# Patient Record
Sex: Female | Born: 1960 | Race: White | Hispanic: No | Marital: Married | Smoking: Never smoker
Health system: Southern US, Community
[De-identification: ages and names within clinical notes are randomized; demographics above are authoritative.]

## PROBLEM LIST (undated history)

## (undated) DIAGNOSIS — F988 Other specified behavioral and emotional disorders with onset usually occurring in childhood and adolescence: Secondary | ICD-10-CM

## (undated) DIAGNOSIS — T7840XA Allergy, unspecified, initial encounter: Secondary | ICD-10-CM

## (undated) DIAGNOSIS — K219 Gastro-esophageal reflux disease without esophagitis: Secondary | ICD-10-CM

## (undated) DIAGNOSIS — D649 Anemia, unspecified: Secondary | ICD-10-CM

## (undated) DIAGNOSIS — I10 Essential (primary) hypertension: Secondary | ICD-10-CM

## (undated) HISTORY — DX: Essential (primary) hypertension: I10

## (undated) HISTORY — DX: Allergy, unspecified, initial encounter: T78.40XA

## (undated) HISTORY — DX: Anemia, unspecified: D64.9

## (undated) HISTORY — DX: Gastro-esophageal reflux disease without esophagitis: K21.9

## (undated) HISTORY — DX: Other specified behavioral and emotional disorders with onset usually occurring in childhood and adolescence: F98.8

## (undated) HISTORY — PX: ESOPHAGOGASTRODUODENOSCOPY ENDOSCOPY: SHX5814

## (undated) HISTORY — PX: JOINT REPLACEMENT: SHX530

---
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
## 1999-02-09 ENCOUNTER — Inpatient Hospital Stay (HOSPITAL_COMMUNITY): Admission: AD | Admit: 1999-02-09 | Discharge: 1999-02-11 | Payer: Self-pay | Admitting: *Deleted

## 1999-03-29 ENCOUNTER — Other Ambulatory Visit: Admission: RE | Admit: 1999-03-29 | Discharge: 1999-03-29 | Payer: Self-pay | Admitting: *Deleted

## 2000-07-31 ENCOUNTER — Other Ambulatory Visit: Admission: RE | Admit: 2000-07-31 | Discharge: 2000-07-31 | Payer: Self-pay | Admitting: *Deleted

## 2001-10-24 ENCOUNTER — Other Ambulatory Visit: Admission: RE | Admit: 2001-10-24 | Discharge: 2001-10-24 | Payer: Self-pay | Admitting: *Deleted

## 2003-02-17 ENCOUNTER — Other Ambulatory Visit: Admission: RE | Admit: 2003-02-17 | Discharge: 2003-02-17 | Payer: Self-pay | Admitting: *Deleted

## 2004-08-30 ENCOUNTER — Other Ambulatory Visit: Admission: RE | Admit: 2004-08-30 | Discharge: 2004-08-30 | Payer: Self-pay | Admitting: Obstetrics and Gynecology

## 2004-10-21 ENCOUNTER — Ambulatory Visit: Payer: Self-pay | Admitting: Cardiology

## 2004-11-15 ENCOUNTER — Ambulatory Visit: Payer: Self-pay | Admitting: Cardiology

## 2004-12-28 ENCOUNTER — Ambulatory Visit: Payer: Self-pay | Admitting: Cardiology

## 2004-12-28 ENCOUNTER — Other Ambulatory Visit: Admission: RE | Admit: 2004-12-28 | Discharge: 2004-12-28 | Payer: Self-pay | Admitting: Obstetrics and Gynecology

## 2005-12-04 HISTORY — PX: FOOT SURGERY: SHX648

## 2008-03-20 ENCOUNTER — Emergency Department: Payer: Self-pay | Admitting: Emergency Medicine

## 2009-04-29 ENCOUNTER — Ambulatory Visit: Payer: Self-pay | Admitting: Urology

## 2010-08-02 ENCOUNTER — Ambulatory Visit: Payer: Self-pay | Admitting: Internal Medicine

## 2011-07-24 ENCOUNTER — Other Ambulatory Visit: Payer: Self-pay | Admitting: *Deleted

## 2011-07-25 ENCOUNTER — Other Ambulatory Visit: Payer: Self-pay | Admitting: *Deleted

## 2011-07-25 MED ORDER — ESOMEPRAZOLE MAGNESIUM 40 MG PO CPDR: 40.0000 mg | DELAYED_RELEASE_CAPSULE | Freq: Two times a day (BID) | ORAL | Status: DC

## 2011-08-15 ENCOUNTER — Other Ambulatory Visit: Payer: Self-pay | Admitting: *Deleted

## 2011-08-15 MED ORDER — NORETHIN ACE-ETH ESTRAD-FE 1-20 MG-MCG PO TABS: 1.0000 | ORAL_TABLET | Freq: Every day | ORAL | Status: DC

## 2011-08-15 NOTE — Telephone Encounter (Signed)
Received faxed refill request from pharmacy. Is it okay to refill Junel?

## 2011-09-06 ENCOUNTER — Ambulatory Visit (INDEPENDENT_AMBULATORY_CARE_PROVIDER_SITE_OTHER): Payer: BC Managed Care – PPO | Admitting: Internal Medicine

## 2011-09-06 ENCOUNTER — Encounter: Payer: Self-pay | Admitting: Internal Medicine

## 2011-09-06 DIAGNOSIS — Z23 Encounter for immunization: Secondary | ICD-10-CM

## 2011-09-06 DIAGNOSIS — Z309 Encounter for contraceptive management, unspecified: Secondary | ICD-10-CM

## 2011-09-06 DIAGNOSIS — Z6825 Body mass index (BMI) 25.0-25.9, adult: Secondary | ICD-10-CM

## 2011-09-06 DIAGNOSIS — I10 Essential (primary) hypertension: Secondary | ICD-10-CM

## 2011-09-06 DIAGNOSIS — Z1239 Encounter for other screening for malignant neoplasm of breast: Secondary | ICD-10-CM

## 2011-09-06 DIAGNOSIS — Z0000 Encounter for general adult medical examination without abnormal findings: Secondary | ICD-10-CM

## 2011-09-06 DIAGNOSIS — E663 Overweight: Secondary | ICD-10-CM

## 2011-09-06 NOTE — Patient Instructions (Signed)
Your blood pressure needs to be less than 130/80 before we can safely start phentermine for appetite control.  Check it one week after starting your lisinopril.  Try to get 25 minutes of ierobic exercise 5 times weekly.  Jacob's Ladder is an excellent non knee or back stressing machine.

## 2011-09-07 ENCOUNTER — Other Ambulatory Visit (HOSPITAL_COMMUNITY)
Admission: RE | Admit: 2011-09-07 | Discharge: 2011-09-07 | Disposition: A | Discharge status: Home / Self Care | Payer: BC Managed Care – PPO | Source: Ambulatory Visit | Attending: Internal Medicine | Admitting: Internal Medicine

## 2011-09-07 ENCOUNTER — Encounter: Payer: Self-pay | Admitting: Internal Medicine

## 2011-09-07 DIAGNOSIS — Z01419 Encounter for gynecological examination (general) (routine) without abnormal findings: Secondary | ICD-10-CM | POA: Insufficient documentation

## 2011-09-07 DIAGNOSIS — E663 Overweight: Secondary | ICD-10-CM | POA: Insufficient documentation

## 2011-09-07 DIAGNOSIS — R030 Elevated blood-pressure reading, without diagnosis of hypertension: Secondary | ICD-10-CM | POA: Insufficient documentation

## 2011-09-07 DIAGNOSIS — Z309 Encounter for contraceptive management, unspecified: Secondary | ICD-10-CM | POA: Insufficient documentation

## 2011-09-07 DIAGNOSIS — N760 Acute vaginitis: Secondary | ICD-10-CM | POA: Insufficient documentation

## 2011-09-07 DIAGNOSIS — Z1159 Encounter for screening for other viral diseases: Secondary | ICD-10-CM | POA: Insufficient documentation

## 2011-09-07 DIAGNOSIS — I10 Essential (primary) hypertension: Secondary | ICD-10-CM | POA: Insufficient documentation

## 2011-09-07 HISTORY — DX: Overweight: E66.3

## 2011-09-07 NOTE — Assessment & Plan Note (Signed)
Secondary to medication noncompliance.  Resume prior medications and will adjust doser for gaol bp of 130/80.

## 2011-09-07 NOTE — Assessment & Plan Note (Signed)
She is requesting an appetite suppressant but her blood pressure is uncontrolled and she is not exercising regularly.  counselling given on diet and exercise.  Will not rx phenetermine until bp is controlled and patient is making a concerted effort to alter lifestyle.

## 2011-09-07 NOTE — Assessment & Plan Note (Signed)
UPT was negative  Pelvic exam and PAP done.  Will refill OCPs

## 2011-09-07 NOTE — Progress Notes (Signed)
  Subjective:    Patient ID: Destiny Martin, female    DOB: Aug 03, 1961, 50 y.o.   MRN: [id number]  HPI    Review of Systems     Objective:   Physical Exam        Assessment & Plan:   Subjective:     Destiny Martin is a 50 y.o. female here for a routine exam.  Current complaints: none.  Personal health questionnaire reviewed: yes.   Gynecologic History Patient's last menstrual period was 08/21/2011. Contraception: OCP (estrogen/progesterone) Last Pap: 2010. Results were: normal Last mammogram 2011. Results were: normal  Obstetric History OB History    Grav Para Term Preterm Abortions TAB SAB Ect Mult Living                   The following portions of the patient's history were reviewed and updated as appropriate: allergies, current medications, past family history, past medical history, past social history, past surgical history and problem list.  Review of Systems A comprehensive review of systems was negative.    Objective:    BP 162/103  Pulse 81  Temp(Src) 98.1 F (36.7 C) (Oral)  Resp 18  Ht 5\' 2"  (1.575 m)  Wt 162 lb 8 oz (73.71 kg)  BMI 29.72 kg/m2  SpO2 99%  LMP 08/21/2011  General Appearance:    Alert, cooperative, no distress, appears stated age  Head:    Normocephalic, without obvious abnormality, atraumatic  Eyes:    PERRL, conjunctiva/corneas clear, EOM's intact, fundi    benign, both eyes  Ears:    Normal TM's and external ear canals, both ears  Nose:   Nares normal, septum midline, mucosa normal, no drainage    or sinus tenderness  Throat:   Lips, mucosa, and tongue normal; teeth and gums normal  Neck:   Supple, symmetrical, trachea midline, no adenopathy;    thyroid:  no enlargement/tenderness/nodules; no carotid   bruit or JVD  Back:     Symmetric, no curvature, ROM normal, no CVA tenderness  Lungs:     Clear to auscultation bilaterally, respirations unlabored  Chest Wall:    No tenderness or deformity   Heart:    Regular rate and  rhythm, S1 and S2 normal, no murmur, rub   or gallop  Breast Exam:    No tenderness, masses, or nipple abnormality  Abdomen:     Soft, non-tender, bowel sounds active all four quadrants,    no masses, no organomegaly  Genitalia:    Normal female without lesion, discharge or tenderness  Rectal:    Normal tone, normal prostate, no masses or tenderness;   guaiac negative stool  Extremities:   Extremities normal, atraumatic, no cyanosis or edema  Pulses:   2+ and symmetric all extremities  Skin:   Skin color, texture, turgor normal, no rashes or lesions  Lymph nodes:   Cervical, supraclavicular, and axillary nodes normal  Neurologic:   CNII-XII intact, normal strength, sensation and reflexes    throughout      Assessment:    Healthy female exam.  Hypertension;  Uncontrolled currently . Secondary to medication adherence/lapse.  Resume lisinopril and increase to 20 mg daily if not at goal of 130/80 in one week.    Plan:    Education reviewed: calcium supplements, low fat, low cholesterol diet and self breast exams. Contraception: OCP (estrogen/progesterone). Mammogram ordered. Follow up in: 6 months.

## 2011-09-13 ENCOUNTER — Telehealth: Payer: Self-pay | Admitting: Internal Medicine

## 2011-09-13 DIAGNOSIS — E663 Overweight: Secondary | ICD-10-CM

## 2011-09-13 MED ORDER — PHENTERMINE HCL 37.5 MG PO TABS: ORAL_TABLET | ORAL | Status: DC

## 2011-09-13 NOTE — Telephone Encounter (Signed)
Pt called to let you know bp  last reading 123/78 pt is talking bp meds now Please call in the phemtermine  [facility] [address]

## 2011-09-13 NOTE — Telephone Encounter (Signed)
Rx has been called in  

## 2011-09-13 NOTE — Telephone Encounter (Signed)
Message copied by Edd Fabian on Wed Sep 13, 2011  5:24 PM ------      Message from: Duncan Dull      Created: Wed Sep 13, 2011  1:19 PM      Regarding: Phentermine       Can you phone in  The phentermine rx for ms Bebo?  I have updated chart,  thanks

## 2011-09-15 ENCOUNTER — Encounter: Payer: Self-pay | Admitting: *Deleted

## 2011-10-02 ENCOUNTER — Other Ambulatory Visit: Payer: Self-pay | Admitting: Internal Medicine

## 2011-10-02 MED ORDER — CITALOPRAM HYDROBROMIDE 20 MG PO TABS: 20.0000 mg | ORAL_TABLET | Freq: Every day | ORAL | Status: DC

## 2012-01-23 ENCOUNTER — Ambulatory Visit: Payer: Self-pay | Admitting: Internal Medicine

## 2012-01-24 ENCOUNTER — Ambulatory Visit: Payer: BC Managed Care – PPO | Admitting: Internal Medicine

## 2012-01-24 ENCOUNTER — Encounter: Payer: Self-pay | Admitting: Internal Medicine

## 2012-01-24 ENCOUNTER — Ambulatory Visit (INDEPENDENT_AMBULATORY_CARE_PROVIDER_SITE_OTHER): Payer: BC Managed Care – PPO | Admitting: Internal Medicine

## 2012-01-24 VITALS — BP 122/86 | Temp 98.4°F | Wt 157.0 lb

## 2012-01-24 DIAGNOSIS — J0100 Acute maxillary sinusitis, unspecified: Secondary | ICD-10-CM

## 2012-01-24 DIAGNOSIS — J329 Chronic sinusitis, unspecified: Secondary | ICD-10-CM

## 2012-01-24 MED ORDER — HYDROCODONE-ACETAMINOPHEN 5-500 MG PO TABS: 1.0000 | ORAL_TABLET | Freq: Four times a day (QID) | ORAL | Status: AC | PRN

## 2012-01-24 MED ORDER — LEVOFLOXACIN 500 MG PO TABS: 500.0000 mg | ORAL_TABLET | Freq: Every day | ORAL | Status: AC

## 2012-01-24 NOTE — Progress Notes (Signed)
Subjective:    Patient ID: Destiny Martin, female    DOB: May 04, 1961, 51 y.o.   MRN: [id number]  HPI   Destiny Martin is a 51 y r old [employer] with multiple sick contacts who presents with 2 weeks of persistent sinus congestion that has not responded to oral decongestnats and antihistamines.She began having pain in her maxillary sinuses and upper teeth 24 hours ago that is persistent and does not respond to ibuprofen..  She is also reporting persistent headache and productive cough but denies fevers and myalgias.   Past Medical History  Diagnosis Date  . Hypertension    Current Outpatient Prescriptions on File Prior to Visit  Medication Sig Dispense Refill  . citalopram (CELEXA) 20 MG tablet Take 1 tablet (20 mg total) by mouth daily.  30 tablet  4  . esomeprazole (NEXIUM) 40 MG capsule Take 1 capsule (40 mg total) by mouth 2 (two) times daily.  60 capsule  6  . lisinopril (PRINIVIL,ZESTRIL) 10 MG tablet Take 10 mg by mouth daily.        . norethindrone-ethinyl estradiol (JUNEL FE,GILDESS FE,LOESTRIN FE) 1-20 MG-MCG tablet Take 1 tablet by mouth daily.  1 Package  12  . phentermine (ADIPEX-P) 37.5 MG tablet Take 1/2 tablet twice daily 30 minutes before breakfast and dinner  30 tablet  2   Review of Systems  Constitutional: Negative for fever, chills and unexpected weight change.  HENT: Positive for congestion, rhinorrhea, dental problem, postnasal drip and sinus pressure. Negative for hearing loss, ear pain, nosebleeds, sore throat, facial swelling, sneezing, mouth sores, trouble swallowing, neck pain, neck stiffness, voice change, tinnitus and ear discharge.   Eyes: Negative for pain, discharge, redness and visual disturbance.  Respiratory: Positive for cough. Negative for chest tightness, shortness of breath, wheezing and stridor.   Cardiovascular: Negative for chest pain, palpitations and leg swelling.  Musculoskeletal: Negative for myalgias and arthralgias.  Skin: Negative for color  change and rash.  Neurological: Positive for headaches. Negative for dizziness, weakness and light-headedness.  Hematological: Negative for adenopathy.       Objective:   Physical Exam  Constitutional: She is oriented to person, place, and time. She appears well-developed and well-nourished.  HENT:  Right Ear: Tympanic membrane is injected and bulging.  Left Ear: Tympanic membrane is injected.  Nose: Right sinus exhibits maxillary sinus tenderness. Left sinus exhibits maxillary sinus tenderness.  Mouth/Throat: Oropharynx is clear and moist.  Eyes: EOM are normal. Pupils are equal, round, and reactive to light. No scleral icterus.  Neck: Normal range of motion. Neck supple. No JVD present. No thyromegaly present.  Cardiovascular: Normal rate, regular rhythm, normal heart sounds and intact distal pulses.   Pulmonary/Chest: Effort normal and breath sounds normal.  Abdominal: Soft. Bowel sounds are normal. She exhibits no mass. There is no tenderness.  Musculoskeletal: Normal range of motion. She exhibits no edema.  Lymphadenopathy:    She has no cervical adenopathy.  Neurological: She is alert and oriented to person, place, and time.  Skin: Skin is warm and dry.  Psychiatric: She has a normal mood and affect.      Assessment & Plan:   Sinusitis, acute maxillary Given chronicity of symptoms, development of facial pain and exam consistent with bacterial URI,  Will treat with empiric antibiotics, decongestants, and saline lavage.       Updated Medication List Outpatient Encounter Prescriptions as of 01/24/2012  Medication Sig Dispense Refill  . citalopram (CELEXA) 20 MG tablet Take 1 tablet (  20 mg total) by mouth daily.  30 tablet  4  . esomeprazole (NEXIUM) 40 MG capsule Take 1 capsule (40 mg total) by mouth 2 (two) times daily.  60 capsule  6  . lisinopril (PRINIVIL,ZESTRIL) 10 MG tablet Take 10 mg by mouth daily.        . norethindrone-ethinyl estradiol (JUNEL FE,GILDESS  FE,LOESTRIN FE) 1-20 MG-MCG tablet Take 1 tablet by mouth daily.  1 Package  12  . phentermine (ADIPEX-P) 37.5 MG tablet Take 1/2 tablet twice daily 30 minutes before breakfast and dinner  30 tablet  2  . HYDROcodone-acetaminophen (VICODIN) 5-500 MG per tablet Take 1 tablet by mouth every 6 (six) hours as needed for pain (or cough).  30 tablet  0  . levofloxacin (LEVAQUIN) 500 MG tablet Take 1 tablet (500 mg total) by mouth daily.  10 tablet  0

## 2012-01-24 NOTE — Patient Instructions (Signed)
I am prescribing Levaquin 500 mg one tablet daily for 10 days for your sinus infection  Please take sudafed PE up to 30 mg every 6 hours to keep the nasal passages from swelling  Use the vicodin for control of cough.  Add simply saline twice a day to flush your sinuses

## 2012-01-25 ENCOUNTER — Other Ambulatory Visit: Payer: Self-pay | Admitting: *Deleted

## 2012-01-25 MED ORDER — URELLE 81 MG PO TABS: 1.0000 | ORAL_TABLET | Freq: Two times a day (BID) | ORAL | Status: DC

## 2012-01-26 ENCOUNTER — Ambulatory Visit: Payer: BC Managed Care – PPO | Admitting: Internal Medicine

## 2012-01-26 ENCOUNTER — Telehealth: Payer: Self-pay | Admitting: *Deleted

## 2012-01-26 MED ORDER — HYDROCOD POLST-CHLORPHEN POLST 10-8 MG/5ML PO LQCR: 5.0000 mL | Freq: Two times a day (BID) | ORAL | Status: DC

## 2012-01-26 NOTE — Telephone Encounter (Signed)
Patient says that the hydrocodone is not helping with the cough. She is asking if she could get tussionex called in.

## 2012-01-26 NOTE — Telephone Encounter (Signed)
Yes you can call in tussionex   5  1 teaspoon every 12 hours prn cough  Qty 200 ml  1 refills

## 2012-01-26 NOTE — Telephone Encounter (Signed)
Addended by: Jobie Quaker on: 01/26/2012 05:08 PM   Modules accepted: Orders

## 2012-01-26 NOTE — Telephone Encounter (Signed)
Rx called to pharmacy

## 2012-01-28 ENCOUNTER — Encounter: Payer: Self-pay | Admitting: Internal Medicine

## 2012-01-28 DIAGNOSIS — J0100 Acute maxillary sinusitis, unspecified: Secondary | ICD-10-CM | POA: Insufficient documentation

## 2012-01-28 NOTE — Assessment & Plan Note (Signed)
Given chronicity of symptoms, development of facial pain and exam consistent with bacterial URI,  Will treat with empiric antibiotics, decongestants, and saline lavage.   

## 2012-02-12 ENCOUNTER — Encounter: Payer: Self-pay | Admitting: Internal Medicine

## 2012-02-19 ENCOUNTER — Telehealth: Payer: Self-pay | Admitting: Internal Medicine

## 2012-02-19 NOTE — Telephone Encounter (Signed)
[facility] Triage Call Report Triage Record Num: [id number] Operator: Di Kindle Patient Name: Destiny Martin Call Date & Time: 02/19/2012 1:02:31PM Patient Phone: [phone] PCP: Duncan Dull Patient Gender: Female PCP Fax : [phone] Patient DOB: [birth year] Practice Name: [facility] Reason for Call: Caller: Durene/Patient is calling with a question about Prednisone.The medication was written by Duncan Dull. Follows OV for congestion ~ 2 weeks ago, completed ABX, states Tussionex for continued cough makes her too sleepl, [facility] [address], states steroid was discussed. Also want to make sure you got her message that you had asked for: phone number. OFFICE PLEASE Protocol(s) Used: PCP Calls, No Triage (Adult) Recommended Outcome per Protocol: Call Provider within 24 Hours Reason for Outcome: [1] Caller requests to speak ONLY to PCP AND [2] nonurgent question Care Advice: ~ 02/19/2012 1:07:55PM Page 1 of 1 [id number]

## 2012-03-05 ENCOUNTER — Other Ambulatory Visit: Payer: Self-pay | Admitting: Internal Medicine

## 2012-03-05 MED ORDER — CITALOPRAM HYDROBROMIDE 20 MG PO TABS: 20.0000 mg | ORAL_TABLET | Freq: Every day | ORAL | Status: DC

## 2012-03-07 ENCOUNTER — Other Ambulatory Visit: Payer: Self-pay | Admitting: Internal Medicine

## 2012-03-07 MED ORDER — ESOMEPRAZOLE MAGNESIUM 40 MG PO CPDR: 40.0000 mg | DELAYED_RELEASE_CAPSULE | Freq: Two times a day (BID) | ORAL | Status: DC

## 2012-03-12 ENCOUNTER — Other Ambulatory Visit: Payer: Self-pay | Admitting: Internal Medicine

## 2012-03-12 MED ORDER — LISINOPRIL 10 MG PO TABS: 10.0000 mg | ORAL_TABLET | Freq: Every day | ORAL | Status: DC

## 2012-04-08 ENCOUNTER — Other Ambulatory Visit: Payer: Self-pay | Admitting: Internal Medicine

## 2012-04-08 MED ORDER — FLUTICASONE PROPIONATE 50 MCG/ACT NA SUSP: 2.0000 | Freq: Every day | NASAL | Status: DC

## 2012-06-24 ENCOUNTER — Telehealth: Payer: Self-pay | Admitting: Internal Medicine

## 2012-06-24 MED ORDER — URELLE 81 MG PO TABS: 1.0000 | ORAL_TABLET | Freq: Two times a day (BID) | ORAL | Status: DC

## 2012-06-24 MED ORDER — CIPROFLOXACIN HCL 250 MG PO TABS: 250.0000 mg | ORAL_TABLET | Freq: Two times a day (BID) | ORAL | Status: DC

## 2012-06-24 NOTE — Telephone Encounter (Signed)
yes, sent to [facility]

## 2012-06-24 NOTE — Telephone Encounter (Signed)
Patient would like the prescription for her urale the medication that makes your urine turn blue.  [facility] [address] She states that this medication is kept on file for her to call when she needs it.  She is going out of town please let her know when this has been called in.

## 2012-06-24 NOTE — Telephone Encounter (Signed)
Patient notified

## 2012-06-24 NOTE — Telephone Encounter (Signed)
Patient called back and stated you normally give her an Rx for Cipro that she keeps on file for when she goes on vacation.  She wanted to know if this was still possible.  Please advise.

## 2012-06-24 NOTE — Telephone Encounter (Signed)
Sent via epic 

## 2012-07-08 ENCOUNTER — Other Ambulatory Visit: Payer: Self-pay | Admitting: *Deleted

## 2012-07-08 MED ORDER — ESOMEPRAZOLE MAGNESIUM 40 MG PO CPDR: 40.0000 mg | DELAYED_RELEASE_CAPSULE | Freq: Two times a day (BID) | ORAL | Status: DC

## 2012-07-13 ENCOUNTER — Other Ambulatory Visit: Payer: Self-pay | Admitting: Internal Medicine

## 2012-07-18 ENCOUNTER — Other Ambulatory Visit: Payer: Self-pay | Admitting: *Deleted

## 2012-07-18 MED ORDER — ESOMEPRAZOLE MAGNESIUM 40 MG PO CPDR: 40.0000 mg | DELAYED_RELEASE_CAPSULE | Freq: Two times a day (BID) | ORAL | Status: DC

## 2012-07-31 ENCOUNTER — Ambulatory Visit: Payer: Self-pay | Admitting: Unknown Physician Specialty

## 2012-08-01 ENCOUNTER — Telehealth: Payer: Self-pay | Admitting: Internal Medicine

## 2012-08-01 DIAGNOSIS — K219 Gastro-esophageal reflux disease without esophagitis: Secondary | ICD-10-CM

## 2012-08-01 MED ORDER — PANTOPRAZOLE SODIUM 40 MG PO TBEC: 40.0000 mg | DELAYED_RELEASE_TABLET | Freq: Every day | ORAL | Status: DC

## 2012-08-01 NOTE — Telephone Encounter (Signed)
Patient notified, she picked up Rx and is taking it to the pharmacy.  She will let us know how it works for her.

## 2012-08-01 NOTE — Telephone Encounter (Signed)
I have no idea which one her insurance will cover,  We wiill try protonix.,  Order on printer

## 2012-08-01 NOTE — Telephone Encounter (Signed)
We had to do a prior authorization on patients Nexium and the insurance denied coverage.   Please advise on another medication I will put the notification in your red folder.

## 2012-08-06 ENCOUNTER — Other Ambulatory Visit: Payer: Self-pay | Admitting: *Deleted

## 2012-08-06 DIAGNOSIS — K219 Gastro-esophageal reflux disease without esophagitis: Secondary | ICD-10-CM

## 2012-08-06 MED ORDER — PANTOPRAZOLE SODIUM 40 MG PO TBEC: 40.0000 mg | DELAYED_RELEASE_TABLET | Freq: Every day | ORAL | Status: DC

## 2012-08-26 ENCOUNTER — Telehealth: Payer: Self-pay | Admitting: Internal Medicine

## 2012-08-26 MED ORDER — NORETHIN ACE-ETH ESTRAD-FE 1-20 MG-MCG PO TABS: 1.0000 | ORAL_TABLET | Freq: Every day | ORAL | Status: DC

## 2012-08-26 NOTE — Telephone Encounter (Signed)
Yes, refill authorized. She's up-to-date on Pap smears

## 2012-08-26 NOTE — Telephone Encounter (Signed)
junel fe 1 mg-20 mcg tablet 28.0 ea Twenty eight Prescribed refills 12 Take 1 tablet by mouth daily

## 2012-08-26 NOTE — Telephone Encounter (Signed)
Ok to refill? Last OV 10/12. No upcoming OV scheduled.

## 2012-08-26 NOTE — Telephone Encounter (Signed)
Refilled by Dr. Darrick Huntsman

## 2012-12-17 ENCOUNTER — Other Ambulatory Visit: Payer: Self-pay | Admitting: Internal Medicine

## 2012-12-17 MED ORDER — LISINOPRIL 10 MG PO TABS: 10.0000 mg | ORAL_TABLET | Freq: Every day | ORAL | Status: DC

## 2012-12-17 NOTE — Telephone Encounter (Signed)
Med filled. Pt due for OV when this RX expires.

## 2012-12-17 NOTE — Telephone Encounter (Signed)
lisinopril (PRINIVIL,ZESTRIL) 10 MG tablet °#90 ° ° °

## 2013-01-16 ENCOUNTER — Other Ambulatory Visit: Payer: Self-pay | Admitting: Internal Medicine

## 2013-03-22 ENCOUNTER — Other Ambulatory Visit: Payer: Self-pay | Admitting: Internal Medicine

## 2013-03-24 NOTE — Telephone Encounter (Signed)
Rx sent to pharmacy by escript for one month. Left message for patient to call and schedule OV, overdue for followup.

## 2013-03-27 ENCOUNTER — Other Ambulatory Visit: Payer: Self-pay | Admitting: Internal Medicine

## 2013-04-16 LAB — HM PAP SMEAR: HM PAP: NORMAL

## 2013-05-02 ENCOUNTER — Ambulatory Visit (INDEPENDENT_AMBULATORY_CARE_PROVIDER_SITE_OTHER): Payer: BC Managed Care – PPO | Admitting: Internal Medicine

## 2013-05-02 ENCOUNTER — Encounter: Payer: Self-pay | Admitting: Internal Medicine

## 2013-05-02 ENCOUNTER — Other Ambulatory Visit (HOSPITAL_COMMUNITY)
Admission: RE | Admit: 2013-05-02 | Discharge: 2013-05-02 | Disposition: A | Discharge status: Home / Self Care | Payer: BC Managed Care – PPO | Source: Ambulatory Visit | Attending: Internal Medicine | Admitting: Internal Medicine

## 2013-05-02 VITALS — BP 108/76 | HR 82 | Temp 98.7°F | Resp 16 | Ht 60.0 in | Wt 157.5 lb

## 2013-05-02 DIAGNOSIS — N760 Acute vaginitis: Secondary | ICD-10-CM

## 2013-05-02 DIAGNOSIS — Z1151 Encounter for screening for human papillomavirus (HPV): Secondary | ICD-10-CM | POA: Insufficient documentation

## 2013-05-02 DIAGNOSIS — E663 Overweight: Secondary | ICD-10-CM

## 2013-05-02 DIAGNOSIS — Z0000 Encounter for general adult medical examination without abnormal findings: Secondary | ICD-10-CM

## 2013-05-02 DIAGNOSIS — I10 Essential (primary) hypertension: Secondary | ICD-10-CM

## 2013-05-02 DIAGNOSIS — Z01419 Encounter for gynecological examination (general) (routine) without abnormal findings: Secondary | ICD-10-CM | POA: Insufficient documentation

## 2013-05-02 DIAGNOSIS — Z309 Encounter for contraceptive management, unspecified: Secondary | ICD-10-CM

## 2013-05-02 DIAGNOSIS — Z124 Encounter for screening for malignant neoplasm of cervix: Secondary | ICD-10-CM

## 2013-05-02 DIAGNOSIS — R5383 Other fatigue: Secondary | ICD-10-CM

## 2013-05-02 DIAGNOSIS — Z6825 Body mass index (BMI) 25.0-25.9, adult: Secondary | ICD-10-CM

## 2013-05-02 MED ORDER — PHENTERMINE HCL 37.5 MG PO TABS: ORAL_TABLET | ORAL | Status: DC

## 2013-05-02 NOTE — Assessment & Plan Note (Signed)
Annual comprehensive exam was done including breast, pelvic and PAP smear. All screenings have been addressed .  

## 2013-05-02 NOTE — Assessment & Plan Note (Signed)
Well controlled on current regimen. Renal function due, no changes today. Reminded to have BP rechecked while taking  phentermine to make sure  It is not causing adverse effects on pulse and BP

## 2013-05-02 NOTE — Assessment & Plan Note (Addendum)
She is requesting to resume phentermine short term.  She is exercising and following a low glycemic index diet along with husband Gery Pray.  She will return in one month for weight in

## 2013-05-02 NOTE — Progress Notes (Signed)
Patient ID: Destiny Martin, female   DOB: 05/15/1961, 52 y.o.   MRN: [id number]  Subjective:     Destiny Martin is a 52 y.o. female and is here for a comprehensive physical exam. The patient reports problems - with inability to lose weight, and decreased concentration.  History   Social History  . Marital Status: Married    Spouse Name: N/A    Number of Children: N/A  . Years of Education: N/A   Occupational History  . Not on file.   Social History Main Topics  . Smoking status: Never Smoker   . Smokeless tobacco: Never Used  . Alcohol Use: Yes     Comment: moderate  . Drug Use: No  . Sexually Active: Not on file   Other Topics Concern  . Not on file   Social History Narrative  . No narrative on file   Health Maintenance  Topic Date Due  . Mammogram  12/18/2012  . Colonoscopy  12/18/2012  . Influenza Vaccine  08/04/2013  . Pap Smear  09/06/2014  . Tetanus/tdap  09/05/2021    The following portions of the patient's history were reviewed and updated as appropriate: allergies, current medications, past family history, past medical history, past social history, past surgical history and problem list.  Review of Systems A comprehensive review of systems was negative.   Objective:   BP 108/76  Pulse 82  Temp(Src) 98.7 F (37.1 C) (Oral)  Resp 16  Ht 5' (1.524 m)  Wt 157 lb 8 oz (71.442 kg)  BMI 30.76 kg/m2  SpO2 98%  LMP 03/12/2013  General Appearance:    Alert, cooperative, no distress, appears stated age  Head:    Normocephalic, without obvious abnormality, atraumatic  Eyes:    PERRL, conjunctiva/corneas clear, EOM's intact, fundi    benign, both eyes  Ears:    Normal TM's and external ear canals, both ears  Nose:   Nares normal, septum midline, mucosa normal, no drainage    or sinus tenderness  Throat:   Lips, mucosa, and tongue normal; teeth and gums normal  Neck:   Supple, symmetrical, trachea midline, no adenopathy;    thyroid:  no  enlargement/tenderness/nodules; no carotid   bruit or JVD  Back:     Symmetric, no curvature, ROM normal, no CVA tenderness  Lungs:     Clear to auscultation bilaterally, respirations unlabored  Chest Wall:    No tenderness or deformity   Heart:    Regular rate and rhythm, S1 and S2 normal, no murmur, rub   or gallop  Breast Exam:    No tenderness, masses, or nipple abnormality  Abdomen:     Soft, non-tender, bowel sounds active all four quadrants,    no masses, no organomegaly  Genitalia:    Pelvic: cervix normal in appearance, external genitalia normal, no adnexal masses or tenderness, no cervical motion tenderness, rectovaginal septum normal, uterus normal size, shape, and consistency and vagina normal without discharge  Extremities:   Extremities normal, atraumatic, no cyanosis or edema  Pulses:   2+ and symmetric all extremities  Skin:   Skin color, texture, turgor normal, no rashes or lesions  Lymph nodes:   Cervical, supraclavicular, and axillary nodes normal  Neurologic:   CNII-XII intact, normal strength, sensation and reflexes    throughout    Assessment:   Routine general medical examination at a health care facility Annual comprehensive exam was done including breast, pelvic and PAP smear. All screenings have been  addressed .   Overweight (BMI 25.0-29.9) She is requesting to resume phentermine short term.  She is exercising and following a low glycemic index diet along with husband Gery Pray.  She will return in one month for weight in   Hypertension Well controlled on current regimen. Renal function due, no changes today. Reminded to have BP rechecked while taking  phentermine to make sure  It is not causing adverse effects on pulse and BP  Contraception management Still menstruating infrequently,.  Continue birth control.    Updated Medication List Outpatient Encounter Prescriptions as of 05/02/2013  Medication Sig Dispense Refill  . citalopram (CELEXA) 20 MG tablet TAKE  1 TABLET (20 MG TOTAL) BY MOUTH DAILY.  30 tablet  4  . esomeprazole (NEXIUM) 40 MG capsule Take 1 capsule (40 mg total) by mouth 2 (two) times daily.  60 capsule  6  . fluticasone (FLONASE) 50 MCG/ACT nasal spray PLACE 2 SPRAYS INTO THE NOSE DAILY.  16 g  6  . lisinopril (PRINIVIL,ZESTRIL) 10 MG tablet TAKE 1 TABLET BY MOUTH DAILY.  30 tablet  0  . norethindrone-ethinyl estradiol (JUNEL FE,GILDESS FE,LOESTRIN FE) 1-20 MG-MCG tablet Take 1 tablet by mouth daily.  1 Package  12  . phentermine (ADIPEX-P) 37.5 MG tablet Take 1/2 tablet twice daily 30 minutes before breakfast and dinner  30 tablet  2  . URELLE (URELLE/URISED) 81 MG TABS Take 1 tablet (81 mg total) by mouth 2 (two) times daily.  10 each  3  . [DISCONTINUED] phentermine (ADIPEX-P) 37.5 MG tablet Take 1/2 tablet twice daily 30 minutes before breakfast and dinner  30 tablet  2  . chlorpheniramine-HYDROcodone (TUSSIONEX) 10-8 MG/5ML LQCR Take 5 mLs by mouth every 12 (twelve) hours.  200 mL  0  . pantoprazole (PROTONIX) 40 MG tablet Take 1 tablet (40 mg total) by mouth daily.  30 tablet  3   No facility-administered encounter medications on file as of 05/02/2013.

## 2013-05-02 NOTE — Assessment & Plan Note (Signed)
Still menstruating infrequently,.  Continue birth control.

## 2013-05-02 NOTE — Patient Instructions (Addendum)
Please return for fasting labs as soon as you can:  We'll screen for diabetes, anemia, cholesterol, etc  dont forget about low carb pasta ([employer],  Found everywhere but [employer])  [employer] has an excellent ready made pesto, you can combine with their precooked chicken sausage and tomatoes with the low carb pasta for a fast meal.  Reat watermelon in moderation!!!  Tru substiuting sugar free fruity pop drinks in your cocktails   We will refill your meds

## 2013-05-05 ENCOUNTER — Other Ambulatory Visit: Payer: Self-pay | Admitting: Internal Medicine

## 2013-05-06 ENCOUNTER — Other Ambulatory Visit (INDEPENDENT_AMBULATORY_CARE_PROVIDER_SITE_OTHER): Payer: BC Managed Care – PPO

## 2013-05-06 ENCOUNTER — Telehealth: Payer: Self-pay | Admitting: Internal Medicine

## 2013-05-06 DIAGNOSIS — K219 Gastro-esophageal reflux disease without esophagitis: Secondary | ICD-10-CM

## 2013-05-06 DIAGNOSIS — D510 Vitamin B12 deficiency anemia due to intrinsic factor deficiency: Secondary | ICD-10-CM

## 2013-05-06 DIAGNOSIS — I10 Essential (primary) hypertension: Secondary | ICD-10-CM

## 2013-05-06 DIAGNOSIS — R5383 Other fatigue: Secondary | ICD-10-CM

## 2013-05-06 DIAGNOSIS — D509 Iron deficiency anemia, unspecified: Secondary | ICD-10-CM

## 2013-05-06 DIAGNOSIS — Z0000 Encounter for general adult medical examination without abnormal findings: Secondary | ICD-10-CM

## 2013-05-06 DIAGNOSIS — Z6825 Body mass index (BMI) 25.0-25.9, adult: Secondary | ICD-10-CM

## 2013-05-06 DIAGNOSIS — E663 Overweight: Secondary | ICD-10-CM

## 2013-05-06 LAB — COMPREHENSIVE METABOLIC PANEL
AST: 12 U/L (ref 0–37)
Albumin: 3.4 g/dL — ABNORMAL LOW (ref 3.5–5.2)
Alkaline Phosphatase: 44 U/L (ref 39–117)
Potassium: 3.8 mEq/L (ref 3.5–5.1)
Sodium: 140 mEq/L (ref 135–145)
Total Bilirubin: 0.2 mg/dL — ABNORMAL LOW (ref 0.3–1.2)
Total Protein: 6.3 g/dL (ref 6.0–8.3)

## 2013-05-06 LAB — LIPID PANEL
HDL: 36.1 mg/dL — ABNORMAL LOW (ref >39.00)
Total CHOL/HDL Ratio: 4
Triglycerides: 103 mg/dL (ref 0.0–149.0)
VLDL: 20.6 mg/dL (ref 0.0–40.0)

## 2013-05-06 LAB — CBC WITH DIFFERENTIAL/PLATELET
Basophils Absolute: 0 10*3/uL (ref 0.0–0.1)
Eosinophils Absolute: 0.5 10*3/uL (ref 0.0–0.7)
MCHC: 32.7 g/dL (ref 30.0–36.0)
MCV: 90.5 fl (ref 78.0–100.0)
Monocytes Absolute: 0.4 10*3/uL (ref 0.1–1.0)
Neutrophils Relative %: 53.9 % (ref 43.0–77.0)
Platelets: 345 10*3/uL (ref 150.0–400.0)
RDW: 14.7 % — ABNORMAL HIGH (ref 11.5–14.6)
WBC: 6.5 10*3/uL (ref 4.5–10.5)

## 2013-05-06 LAB — TSH: TSH: 0.7 u[IU]/mL (ref 0.35–5.50)

## 2013-05-06 LAB — IBC PANEL
Saturation Ratios: 8 % — ABNORMAL LOW (ref 20.0–50.0)
Transferrin: 303.3 mg/dL (ref 212.0–360.0)

## 2013-05-06 LAB — FERRITIN: Ferritin: 4.3 ng/mL — ABNORMAL LOW (ref 10.0–291.0)

## 2013-05-06 MED ORDER — LISINOPRIL 10 MG PO TABS: ORAL_TABLET | ORAL | Status: DC

## 2013-05-06 MED ORDER — PANTOPRAZOLE SODIUM 40 MG PO TBEC: 40.0000 mg | DELAYED_RELEASE_TABLET | Freq: Every day | ORAL | Status: DC

## 2013-05-06 NOTE — Telephone Encounter (Signed)
[facility] [address] [facility]  Her bp please send  Pt would like you to refill all meds

## 2013-05-06 NOTE — Telephone Encounter (Signed)
BP medication refilled  

## 2013-05-07 ENCOUNTER — Encounter: Payer: Self-pay | Admitting: Internal Medicine

## 2013-05-07 DIAGNOSIS — N760 Acute vaginitis: Secondary | ICD-10-CM | POA: Insufficient documentation

## 2013-05-07 DIAGNOSIS — D510 Vitamin B12 deficiency anemia due to intrinsic factor deficiency: Secondary | ICD-10-CM | POA: Insufficient documentation

## 2013-05-07 DIAGNOSIS — D509 Iron deficiency anemia, unspecified: Secondary | ICD-10-CM | POA: Insufficient documentation

## 2013-05-07 MED ORDER — CYANOCOBALAMIN 1000 MCG SL SUBL: 1.0000 | SUBLINGUAL_TABLET | Freq: Every day | SUBLINGUAL | Status: DC

## 2013-05-07 MED ORDER — TANDEM PLUS 162-115.2-1 MG PO CAPS: 1.0000 | ORAL_CAPSULE | Freq: Every day | ORAL | Status: DC

## 2013-05-07 MED ORDER — FLUCONAZOLE 150 MG PO TABS: 150.0000 mg | ORAL_TABLET | Freq: Every day | ORAL | Status: DC

## 2013-05-07 NOTE — Addendum Note (Signed)
Addended by: Sherlene Shams on: 05/07/2013 12:43 PM   Modules accepted: Orders

## 2013-05-07 NOTE — Addendum Note (Signed)
Addended by: Sherlene Shams on: 05/07/2013 01:07 PM   Modules accepted: Orders

## 2013-05-08 NOTE — Telephone Encounter (Signed)
Medication refill was sent on 60/02/2013

## 2013-05-09 ENCOUNTER — Telehealth: Payer: Self-pay

## 2013-05-09 NOTE — Telephone Encounter (Signed)
My Chart Message: PAP smear was normal, And HPV screen negative. Repeat in 2 years. You did have a yeast infection so I am sending fluconazole Rx to your pharmacy. Your iron and B12 are both low. The iron can be replaced with an oral supplement I will send to your pharmacy, But you need to have your B12 repalced with IM injections weekly x 3, then monthly, which you can give yourself after you develop comfort doing so. Come by ASAP for your first injection. If you can't get her for a few weeks and do not know how to give yourself an injection, start takign sublingula (under the tongue) B12 supplements 1000 mcg daily.   Left message for patient to either call office back or to go on her mychart and read the message

## 2013-05-14 ENCOUNTER — Telehealth: Payer: Self-pay

## 2013-05-14 ENCOUNTER — Ambulatory Visit (INDEPENDENT_AMBULATORY_CARE_PROVIDER_SITE_OTHER): Payer: BC Managed Care – PPO | Admitting: *Deleted

## 2013-05-14 DIAGNOSIS — E538 Deficiency of other specified B group vitamins: Secondary | ICD-10-CM

## 2013-05-14 MED ORDER — CYANOCOBALAMIN 1000 MCG/ML IJ SOLN
1000.0000 ug | Freq: Once | INTRAMUSCULAR | Status: AC
  Administered 2013-05-14: 1000 ug via INTRAMUSCULAR

## 2013-05-14 NOTE — Telephone Encounter (Signed)
Patient came in, and wanted to ask if you could look over her husbands chart at [facility] he had broken ribs. And he was at home and felt a pop right under his breast bone, she also wanted to know do you think she should take him back to the hospital. Notes should be in your folder

## 2013-05-14 NOTE — Telephone Encounter (Signed)
No problem she was told in person when she came to the office

## 2013-05-14 NOTE — Telephone Encounter (Signed)
Called patient no answer and her voicemail is full

## 2013-05-14 NOTE — Telephone Encounter (Signed)
Needs to call the trauma surgeon on call to arrange a repeat chest x ray.  PT referral in process at [facility]

## 2013-05-21 ENCOUNTER — Ambulatory Visit (INDEPENDENT_AMBULATORY_CARE_PROVIDER_SITE_OTHER): Payer: BC Managed Care – PPO | Admitting: *Deleted

## 2013-05-21 DIAGNOSIS — E538 Deficiency of other specified B group vitamins: Secondary | ICD-10-CM

## 2013-05-21 MED ORDER — CYANOCOBALAMIN 1000 MCG/ML IJ SOLN
1000.0000 ug | Freq: Once | INTRAMUSCULAR | Status: AC
  Administered 2013-05-21: 1000 ug via INTRAMUSCULAR

## 2013-05-28 ENCOUNTER — Ambulatory Visit (INDEPENDENT_AMBULATORY_CARE_PROVIDER_SITE_OTHER): Payer: BC Managed Care – PPO | Admitting: *Deleted

## 2013-05-28 ENCOUNTER — Telehealth: Payer: Self-pay | Admitting: *Deleted

## 2013-05-28 DIAGNOSIS — E538 Deficiency of other specified B group vitamins: Secondary | ICD-10-CM

## 2013-05-28 MED ORDER — CYANOCOBALAMIN 1000 MCG/ML IJ SOLN
1000.0000 ug | Freq: Once | INTRAMUSCULAR | Status: AC
  Administered 2013-05-28: 1000 ug via INTRAMUSCULAR

## 2013-05-28 MED ORDER — CYANOCOBALAMIN 1000 MCG/ML IJ SOLN: 1000.0000 ug | Freq: Once | INTRAMUSCULAR | Status: DC

## 2013-05-28 MED ORDER — SYRINGE (DISPOSABLE) 1 ML MISC: Status: DC

## 2013-05-28 NOTE — Telephone Encounter (Signed)
Pt came by today for #3 weekly B12 injection. Requesting Rx to do monthly injections at home, has family member that will give them. Ok?

## 2013-05-28 NOTE — Telephone Encounter (Signed)
Yes,  Will send rx to pharmacy.  Remind her that when there are national shortages on the injectable form she can switch to the  sublingual tablets daily until it is available again

## 2013-05-29 NOTE — Telephone Encounter (Signed)
Left message for pt to return my call.

## 2013-06-16 LAB — HM MAMMOGRAPHY: HM Mammogram: NORMAL

## 2013-06-26 ENCOUNTER — Ambulatory Visit: Payer: Self-pay | Admitting: Internal Medicine

## 2013-06-26 ENCOUNTER — Other Ambulatory Visit: Payer: Self-pay | Admitting: Internal Medicine

## 2013-06-29 ENCOUNTER — Other Ambulatory Visit: Payer: Self-pay | Admitting: Internal Medicine

## 2013-07-09 ENCOUNTER — Encounter: Payer: Self-pay | Admitting: Internal Medicine

## 2013-07-11 ENCOUNTER — Other Ambulatory Visit: Payer: Self-pay | Admitting: Internal Medicine

## 2013-07-11 NOTE — Telephone Encounter (Signed)
Okay to refill? 

## 2013-07-11 NOTE — Telephone Encounter (Signed)
No until she is reached by phone to determine whey she is requesting refill on an antibiotic.  If she is having urinary symptoms.  Ok to refill.

## 2013-07-14 ENCOUNTER — Telehealth: Payer: Self-pay | Admitting: *Deleted

## 2013-07-14 MED ORDER — CIPROFLOXACIN HCL 250 MG PO TABS: 250.0000 mg | ORAL_TABLET | Freq: Two times a day (BID) | ORAL | Status: DC

## 2013-07-14 NOTE — Telephone Encounter (Signed)
Telephone note from 06/24/12 stated you give patient script for when she goes on vacation please advise as to refill.

## 2013-07-14 NOTE — Telephone Encounter (Signed)
Correct,  You can call in  .  rx authorized

## 2013-07-14 NOTE — Telephone Encounter (Signed)
Refill Request  Ciprofloxacin HCL 250 mg   #14  Take one tablet by mouth 2 time daily

## 2013-07-15 MED ORDER — CIPROFLOXACIN HCL 250 MG PO TABS: 250.0000 mg | ORAL_TABLET | Freq: Two times a day (BID) | ORAL | Status: DC

## 2013-07-15 NOTE — Telephone Encounter (Signed)
Script sent electronically 

## 2013-08-13 MED ORDER — SYRINGE (DISPOSABLE) 1 ML MISC: Status: DC

## 2013-08-26 ENCOUNTER — Other Ambulatory Visit: Payer: Self-pay | Admitting: Internal Medicine

## 2013-10-09 ENCOUNTER — Other Ambulatory Visit: Payer: Self-pay

## 2013-11-03 ENCOUNTER — Other Ambulatory Visit: Payer: Self-pay | Admitting: Internal Medicine

## 2013-11-09 ENCOUNTER — Other Ambulatory Visit: Payer: Self-pay | Admitting: Internal Medicine

## 2014-01-23 ENCOUNTER — Other Ambulatory Visit: Payer: Self-pay | Admitting: Internal Medicine

## 2014-02-03 ENCOUNTER — Other Ambulatory Visit: Payer: Self-pay | Admitting: Internal Medicine

## 2014-02-04 MED ORDER — CYANOCOBALAMIN 1000 MCG/ML IJ SOLN: 1000.0000 ug | INTRAMUSCULAR | Status: DC

## 2014-03-02 ENCOUNTER — Telehealth: Payer: Self-pay | Admitting: *Deleted

## 2014-03-02 DIAGNOSIS — R5381 Other malaise: Secondary | ICD-10-CM

## 2014-03-02 DIAGNOSIS — R5383 Other fatigue: Principal | ICD-10-CM

## 2014-03-02 NOTE — Telephone Encounter (Signed)
Patient has been taking B12 injections on an off since 6/14 and patient is experiencing fatigue patient would like to know if she needs to be seen or can labs be ran to see if B 12 has changed. Please advise.

## 2014-03-02 NOTE — Telephone Encounter (Signed)
Labs ordered,  Make appt for labs

## 2014-03-03 ENCOUNTER — Emergency Department: Payer: Self-pay | Admitting: Emergency Medicine

## 2014-03-03 ENCOUNTER — Other Ambulatory Visit (INDEPENDENT_AMBULATORY_CARE_PROVIDER_SITE_OTHER): Payer: BC Managed Care – PPO

## 2014-03-03 DIAGNOSIS — R5383 Other fatigue: Principal | ICD-10-CM

## 2014-03-03 DIAGNOSIS — R5381 Other malaise: Secondary | ICD-10-CM

## 2014-03-03 LAB — COMPREHENSIVE METABOLIC PANEL
ALK PHOS: 51 U/L (ref 39–117)
ALT: 15 U/L (ref 0–35)
AST: 17 U/L (ref 0–37)
Albumin: 3.9 g/dL (ref 3.5–5.2)
BUN: 8 mg/dL (ref 6–23)
CO2: 27 mEq/L (ref 19–32)
Calcium: 9.1 mg/dL (ref 8.4–10.5)
Chloride: 101 mEq/L (ref 96–112)
Creatinine, Ser: 0.8 mg/dL (ref 0.4–1.2)
GFR: 80.31 mL/min (ref >60.00)
Glucose, Bld: 112 mg/dL — ABNORMAL HIGH (ref 70–99)
Potassium: 3.6 mEq/L (ref 3.5–5.1)
Sodium: 137 mEq/L (ref 135–145)
Total Bilirubin: 0.5 mg/dL (ref 0.3–1.2)
Total Protein: 6.9 g/dL (ref 6.0–8.3)

## 2014-03-03 LAB — CBC WITH DIFFERENTIAL/PLATELET
BASOS PCT: 0.3 % (ref 0.0–3.0)
Basophils Absolute: 0 10*3/uL (ref 0.0–0.1)
EOS PCT: 1.1 % (ref 0.0–5.0)
Eosinophils Absolute: 0.2 10*3/uL (ref 0.0–0.7)
HCT: 40.7 % (ref 36.0–46.0)
Hemoglobin: 13.8 g/dL (ref 12.0–15.0)
LYMPHS ABS: 2.2 10*3/uL (ref 0.7–4.0)
Lymphocytes Relative: 16 % (ref 12.0–46.0)
MCHC: 33.9 g/dL (ref 30.0–36.0)
MCV: 92.6 fl (ref 78.0–100.0)
Monocytes Absolute: 0.7 10*3/uL (ref 0.1–1.0)
Monocytes Relative: 4.8 % (ref 3.0–12.0)
NEUTROS PCT: 77.8 % — AB (ref 43.0–77.0)
Neutro Abs: 10.5 10*3/uL — ABNORMAL HIGH (ref 1.4–7.7)
Platelets: 474 10*3/uL — ABNORMAL HIGH (ref 150.0–400.0)
RBC: 4.4 Mil/uL (ref 3.87–5.11)
RDW: 13.5 % (ref 11.5–14.6)
WBC: 13.5 10*3/uL — AB (ref 4.5–10.5)

## 2014-03-03 LAB — TSH: TSH: 2.02 u[IU]/mL (ref 0.35–5.50)

## 2014-03-03 LAB — VITAMIN B12: Vitamin B-12: 981 pg/mL — ABNORMAL HIGH (ref 211–911)

## 2014-03-03 NOTE — Telephone Encounter (Signed)
Patient scheduled for labs today.

## 2014-03-03 NOTE — Telephone Encounter (Signed)
Left message for patient to return call to office. 

## 2014-03-04 LAB — FOLATE RBC: RBC FOLATE: 555 ng/mL (ref >280)

## 2014-03-05 ENCOUNTER — Encounter: Payer: Self-pay | Admitting: Internal Medicine

## 2014-03-16 ENCOUNTER — Encounter: Payer: Self-pay | Admitting: Internal Medicine

## 2014-03-16 ENCOUNTER — Ambulatory Visit (INDEPENDENT_AMBULATORY_CARE_PROVIDER_SITE_OTHER): Payer: BC Managed Care – PPO | Admitting: Internal Medicine

## 2014-03-16 VITALS — BP 138/94 | HR 107 | Temp 98.6°F | Resp 16 | Wt 157.2 lb

## 2014-03-16 DIAGNOSIS — G4733 Obstructive sleep apnea (adult) (pediatric): Secondary | ICD-10-CM | POA: Insufficient documentation

## 2014-03-16 DIAGNOSIS — I10 Essential (primary) hypertension: Secondary | ICD-10-CM

## 2014-03-16 DIAGNOSIS — D509 Iron deficiency anemia, unspecified: Secondary | ICD-10-CM

## 2014-03-16 DIAGNOSIS — M542 Cervicalgia: Secondary | ICD-10-CM

## 2014-03-16 DIAGNOSIS — E663 Overweight: Secondary | ICD-10-CM

## 2014-03-16 DIAGNOSIS — Z1211 Encounter for screening for malignant neoplasm of colon: Secondary | ICD-10-CM

## 2014-03-16 DIAGNOSIS — D510 Vitamin B12 deficiency anemia due to intrinsic factor deficiency: Secondary | ICD-10-CM

## 2014-03-16 MED ORDER — HYDROCODONE-ACETAMINOPHEN 10-325 MG PO TABS: 1.0000 | ORAL_TABLET | Freq: Three times a day (TID) | ORAL | Status: DC | PRN

## 2014-03-16 MED ORDER — METHOCARBAMOL 500 MG PO TABS: 500.0000 mg | ORAL_TABLET | Freq: Four times a day (QID) | ORAL | Status: DC

## 2014-03-16 NOTE — Progress Notes (Signed)
Pre-visit discussion using our clinic review tool. No additional management support is needed unless otherwise documented below in the visit note.  

## 2014-03-16 NOTE — Patient Instructions (Addendum)
Continue ibuprofen 800 mg every 8 hours  Adding generic robaxin  A muscle relaxer to take every 8 hours as needed for muscle spasm ' vicodin 10/325,  You can cut it in half ,  maximum 4 tablets daily  Take dulcolx or sennakot s to prevent constipation  Plain  xrays of cervical spine to be done at [facility]  MRI  To follow (we will do it at [facility] so Dr Danielle DessElsner can see it )
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
## 2014-03-17 DIAGNOSIS — M47812 Spondylosis without myelopathy or radiculopathy, cervical region: Secondary | ICD-10-CM | POA: Insufficient documentation

## 2014-03-17 NOTE — Assessment & Plan Note (Addendum)
Anemia resolved.  She is due for screening Colonoscopy.

## 2014-03-17 NOTE — Assessment & Plan Note (Signed)
She has deferred CPAP titration but is willing to repeat sutdy after her acute issues have resolved.

## 2014-03-17 NOTE — Assessment & Plan Note (Signed)
Not Well controlled today due to pain issues.  No changes today.  Lab Results  Component Value Date   CREATININE 0.8 03/03/2014   Lab Results  Component Value Date   NA 137 03/03/2014   K 3.6 03/03/2014   CL 101 03/03/2014   CO2 27 03/03/2014

## 2014-03-17 NOTE — Assessment & Plan Note (Signed)
I have addressed  BMI and recommended wt loss of 10% of body weigh over the next 6 months using a low glycemic index diet and regular exercise a minimum of 5 days per week.   

## 2014-03-17 NOTE — Assessment & Plan Note (Addendum)
Repeat CBC normal post supplementation with  B12 and iron .  Lab Results  Component Value Date   WBC 13.5* 03/03/2014   HGB 13.8 03/03/2014   HCT 40.7 03/03/2014   MCV 92.6 03/03/2014   PLT 474.0* 03/03/2014
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
## 2014-03-18 ENCOUNTER — Telehealth: Payer: Self-pay | Admitting: Internal Medicine

## 2014-03-18 ENCOUNTER — Ambulatory Visit (INDEPENDENT_AMBULATORY_CARE_PROVIDER_SITE_OTHER)
Admission: RE | Admit: 2014-03-18 | Discharge: 2014-03-18 | Disposition: A | Discharge status: Home / Self Care | Payer: BC Managed Care – PPO | Source: Ambulatory Visit | Attending: Internal Medicine | Admitting: Internal Medicine

## 2014-03-18 ENCOUNTER — Encounter: Payer: Self-pay | Admitting: Emergency Medicine

## 2014-03-18 ENCOUNTER — Encounter: Payer: Self-pay | Admitting: Internal Medicine

## 2014-03-18 DIAGNOSIS — M542 Cervicalgia: Secondary | ICD-10-CM

## 2014-03-18 DIAGNOSIS — M5010 Cervical disc disorder with radiculopathy, unspecified cervical region: Secondary | ICD-10-CM

## 2014-03-18 NOTE — Addendum Note (Signed)
Addended by: Sherlene ShamsULLO, Ashad Fawbush L on: 03/18/2014 05:01 PM   Modules accepted: Level of Service
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
## 2014-03-18 NOTE — Telephone Encounter (Signed)
Please advise 
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
## 2014-03-19 ENCOUNTER — Encounter: Payer: Self-pay | Admitting: Internal Medicine
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
## 2014-03-19 NOTE — Telephone Encounter (Signed)
MRI of cervical spine has been ordered .  It should be done at a  [facility] facility in [place].  Amber will call  You with an appt   Message e mailed.

## 2014-03-19 NOTE — Telephone Encounter (Signed)
Left message for patient to return call to office. 

## 2014-04-01 ENCOUNTER — Ambulatory Visit (HOSPITAL_COMMUNITY)
Admission: RE | Admit: 2014-04-01 | Discharge: 2014-04-01 | Disposition: A | Discharge status: Home / Self Care | Payer: BC Managed Care – PPO | Source: Ambulatory Visit | Attending: Internal Medicine | Admitting: Internal Medicine

## 2014-04-01 DIAGNOSIS — M542 Cervicalgia: Secondary | ICD-10-CM | POA: Insufficient documentation

## 2014-04-01 DIAGNOSIS — M47812 Spondylosis without myelopathy or radiculopathy, cervical region: Secondary | ICD-10-CM | POA: Insufficient documentation

## 2014-04-01 DIAGNOSIS — M5010 Cervical disc disorder with radiculopathy, unspecified cervical region: Secondary | ICD-10-CM

## 2014-04-01 DIAGNOSIS — M47817 Spondylosis without myelopathy or radiculopathy, lumbosacral region: Secondary | ICD-10-CM | POA: Insufficient documentation

## 2014-04-01 DIAGNOSIS — R209 Unspecified disturbances of skin sensation: Secondary | ICD-10-CM | POA: Insufficient documentation

## 2014-04-02 ENCOUNTER — Encounter: Payer: Self-pay | Admitting: Internal Medicine

## 2014-04-08 ENCOUNTER — Encounter: Payer: Self-pay | Admitting: Internal Medicine

## 2014-04-09 NOTE — Telephone Encounter (Signed)
Appointment cancelled

## 2014-04-15 ENCOUNTER — Ambulatory Visit: Payer: Self-pay | Admitting: General Surgery

## 2014-04-24 ENCOUNTER — Ambulatory Visit (INDEPENDENT_AMBULATORY_CARE_PROVIDER_SITE_OTHER): Payer: BC Managed Care – PPO | Admitting: Adult Health

## 2014-04-24 ENCOUNTER — Encounter: Payer: Self-pay | Admitting: Adult Health

## 2014-04-24 ENCOUNTER — Telehealth: Payer: Self-pay | Admitting: Internal Medicine

## 2014-04-24 VITALS — BP 130/80 | HR 104 | Temp 98.0°F | Resp 14 | Wt 159.5 lb

## 2014-04-24 DIAGNOSIS — R221 Localized swelling, mass and lump, neck: Secondary | ICD-10-CM

## 2014-04-24 DIAGNOSIS — R220 Localized swelling, mass and lump, head: Secondary | ICD-10-CM

## 2014-04-24 MED ORDER — METHYLPREDNISOLONE ACETATE 40 MG/ML IJ SUSP
40.0000 mg | Freq: Once | INTRAMUSCULAR | Status: AC
  Administered 2014-04-24: 40 mg via INTRAMUSCULAR

## 2014-04-24 MED ORDER — PREDNISONE 10 MG PO TABS: ORAL_TABLET | ORAL | Status: DC

## 2014-04-24 NOTE — Progress Notes (Signed)
Pre visit review using our clinic review tool, if applicable. No additional management support is needed unless otherwise documented below in the visit note. 

## 2014-04-24 NOTE — Progress Notes (Signed)
Patient ID: Destiny Martin, female   DOB: 1961/03/13, 53 y.o.   MRN: [id number]   Subjective:    Patient ID: Destiny CarbonAllyson S Gascoigne, female    DOB: 1961/03/13, 53 y.o.   MRN: [id number]  HPI  Pt is a pleasant 53 y/o female who presents with facial swelling. She has not used any new makeup, creams. She has not taken any new medications. The only thing she has done differently is that she was preparing some tacos for her children last night and used a box mix with spices. She does not have any swelling of her tongue or throat. No shortness of breath.   Past Medical History  Diagnosis Date  . Hypertension     Current Outpatient Prescriptions on File Prior to Visit  Medication Sig Dispense Refill  . amphetamine-dextroamphetamine (ADDERALL) 30 MG tablet Take 30 mg by mouth daily.      . chlorpheniramine-HYDROcodone (TUSSIONEX) 10-8 MG/5ML LQCR Take 5 mLs by mouth every 12 (twelve) hours.  200 mL  0  . cyanocobalamin (,VITAMIN B-12,) 1000 MCG/ML injection Inject 1 mL (1,000 mcg total) into the muscle once a week. For 3 weeks,  Then monthly  10 mL  0  . FeFum-FePo-FA-B Cmp-C-Zn-Mn-Cu (SE-TAN PLUS) 162-115.2-1 MG CAPS TAKE 1 CAPSULE BY MOUTH DAILY AFTER SUPPER.  30 capsule  5  . fluticasone (FLONASE) 50 MCG/ACT nasal spray PLACE 2 SPRAYS INTO THE NOSE DAILY.  16 g  5  . HYDROcodone-acetaminophen (NORCO) 10-325 MG per tablet Take 1 tablet by mouth every 8 (eight) hours as needed.  120 tablet  0  . JUNEL FE 1/20 1-20 MG-MCG tablet TAKE 1 TABLET BY MOUTH DAILY.  28 tablet  8  . lisinopril (PRINIVIL,ZESTRIL) 10 MG tablet TAKE 1 TABLET BY MOUTH DAILY.  30 tablet  5  . methocarbamol (ROBAXIN) 500 MG tablet Take 1 tablet (500 mg total) by mouth 4 (four) times daily.  90 tablet  0  . Syringe, Disposable, 1 ML MISC Use monthly for B12 injections  25 each  2  . URELLE (URELLE/URISED) 81 MG TABS Take 1 tablet (81 mg total) by mouth 2 (two) times daily.  10 each  3  . esomeprazole (NEXIUM) 40 MG capsule Take 1  capsule (40 mg total) by mouth 2 (two) times daily.  60 capsule  6   No current facility-administered medications on file prior to visit.     Review of Systems  Constitutional: Negative for fever and chills.  HENT: Positive for facial swelling (lip and slight swelling on the left side).   Respiratory: Negative for cough, chest tightness, shortness of breath and wheezing.   Cardiovascular: Negative.   Musculoskeletal: Negative.   Skin: Negative for rash.  Neurological: Negative.   Psychiatric/Behavioral: Negative.   All other systems reviewed and are negative.      Objective:  BP 130/80  Pulse 104  Temp(Src) 98 F (36.7 C) (Oral)  Resp 14  Wt 159 lb 8 oz (72.349 kg)  SpO2 98%   Physical Exam  Constitutional: She is oriented to person, place, and time. No distress.  HENT:  Head: Normocephalic and atraumatic.  Eyes: Conjunctivae and EOM are normal.  Neck: Normal range of motion. Neck supple.  Cardiovascular: Regular rhythm, normal heart sounds and intact distal pulses.  Exam reveals no gallop and no friction rub.   No murmur heard. Tachycardia, HR 104  Pulmonary/Chest: Effort normal and breath sounds normal. No respiratory distress. She has no wheezes. She has no rales.  Musculoskeletal: Normal range of motion.  Neurological: She is alert and oriented to person, place, and time. She has normal reflexes.  Skin: Skin is warm and dry.  Psychiatric: She has a normal mood and affect. Her behavior is normal. Judgment and thought content normal.          Assessment & Plan:

## 2014-04-24 NOTE — Telephone Encounter (Signed)
Patient Information:  Caller Name: Laniesha  Phone: [phone]  Patient: Destiny Martin, Destiny Martin  Gender: Female  DOB: 10/26/1961  Age: 53 Years  PCP: Duncan Dull (Adults only)  Pregnant: No  Office Follow Up:  Does the office need to follow up with this patient?: Yes  Instructions For The Office: Patient asks to be taken to the back ASAP after her arrival due to her appearance with swollen face.   Symptoms  Reason For Call & Symptoms: Allergic reaction onset "middle of the night:".  Lips swelling, left cheek swollen.  No rash.  Other ermergent symptoms ruled out. Go to ED Now or to Office with PCP Approval due ot Taking an ACE inhibitor medication (Lisinopril)  Reviewed Health History In EMR: Yes  Reviewed Medications In EMR: Yes  Reviewed Allergies In EMR: Yes  Reviewed Surgeries / Procedures: Yes  Date of Onset of Symptoms: 04/24/2014  Treatments Tried: Benadryl 50 mg x 2, ice packs.  Treatments Tried Worked: No OB / GYN:  LMP: Unknown  Guideline(s) Used:  Face Swelling  Disposition Per Guideline:   Go to ED Now (or to Office with PCP Approval)  Reason For Disposition Reached:   Taking an ACE Inhibitor medication (e.g., benazepril/LOTENSIN, captopril/CAPOTEN, enalapril/VASOTEC, lisinopril/ZESTRIL)  Advice Given:  Apply Cold to the Area:  Apply a cool, wet washcloth to the face for 20 minutes.  Antihistamine Medication for Facial Swelling:  Sometimes antihistamine medications are helpful in reducing swelling from a food or allergic reaction. You can take diphenhydramine (e.g., Benadryl). The adult dosage 25-50 mg by mouth every 6 hours on an as needed basis.  Call Back If  You become worse.  Patient Refused Recommendation:  Patient Refused Care Advice  Patient states she will not go to ED; she requests appointment at office.  Ok to schedule with R. Rey at 16:15 per Lupita Leash.

## 2014-04-24 NOTE — Patient Instructions (Addendum)
  Start prednisone taper as follows:  Day #1 - take 6 tablets Day #2 - take 5 tablets Day #3 - take 4 tablets Day #4 - take 3 tablets Day #5 - take 2 tablets Day #6 - take 1 tablet  Take benadryl for the next 1-2 days.  Go to the ED if your tongue begins to swell or you develop any shortness of breath.

## 2014-04-24 NOTE — Telephone Encounter (Signed)
FYI

## 2014-05-04 ENCOUNTER — Other Ambulatory Visit: Payer: Self-pay | Admitting: *Deleted

## 2014-05-04 MED ORDER — NORETHIN ACE-ETH ESTRAD-FE 1-20 MG-MCG PO TABS: ORAL_TABLET | ORAL | Status: DC

## 2014-06-11 ENCOUNTER — Other Ambulatory Visit: Payer: Self-pay | Admitting: Internal Medicine

## 2014-06-11 MED ORDER — LISINOPRIL 10 MG PO TABS: ORAL_TABLET | ORAL | Status: DC

## 2014-06-11 NOTE — Telephone Encounter (Signed)
Refill for lisinopril sent

## 2014-08-18 ENCOUNTER — Telehealth: Payer: Self-pay | Admitting: Internal Medicine

## 2014-08-18 MED ORDER — LOSARTAN POTASSIUM 50 MG PO TABS: 50.0000 mg | ORAL_TABLET | Freq: Every day | ORAL | Status: DC

## 2014-08-18 NOTE — Telephone Encounter (Signed)
[facility] called . Patient saw Dr. Andee Poles for angio- edema  , Dr. Andee Poles would like for you to switch patient from lisinopril to losartan if you are agreeable due to the angio-edema please advise?

## 2014-08-18 NOTE — Telephone Encounter (Signed)
Yes,  i sent losartan to pharmacy and dcd lisinopril

## 2014-08-18 NOTE — Telephone Encounter (Signed)
Patient notified and voiced understanding and called Destiny Martin and left message for Dr. Andee Poles office.

## 2014-09-18 ENCOUNTER — Other Ambulatory Visit: Payer: Self-pay

## 2014-09-19 ENCOUNTER — Ambulatory Visit (INDEPENDENT_AMBULATORY_CARE_PROVIDER_SITE_OTHER): Payer: BC Managed Care – PPO

## 2014-09-19 DIAGNOSIS — Z23 Encounter for immunization: Secondary | ICD-10-CM

## 2014-10-22 ENCOUNTER — Other Ambulatory Visit: Payer: Self-pay | Admitting: Internal Medicine

## 2014-10-31 ENCOUNTER — Other Ambulatory Visit: Payer: Self-pay | Admitting: Internal Medicine

## 2014-11-02 ENCOUNTER — Other Ambulatory Visit: Payer: Self-pay | Admitting: *Deleted

## 2014-11-02 MED ORDER — SE-TAN PLUS 162-115.2-1 MG PO CAPS: ORAL_CAPSULE | ORAL | Status: DC

## 2014-11-21 ENCOUNTER — Other Ambulatory Visit: Payer: Self-pay | Admitting: Internal Medicine

## 2014-12-04 HISTORY — PX: NECK SURGERY: SHX720

## 2014-12-11 ENCOUNTER — Other Ambulatory Visit: Payer: Self-pay | Admitting: Internal Medicine

## 2014-12-18 ENCOUNTER — Other Ambulatory Visit: Payer: Self-pay | Admitting: Internal Medicine

## 2014-12-29 ENCOUNTER — Telehealth: Payer: Self-pay | Admitting: Internal Medicine

## 2014-12-29 ENCOUNTER — Other Ambulatory Visit: Payer: Self-pay | Admitting: Internal Medicine

## 2014-12-29 NOTE — Telephone Encounter (Signed)
seh can either wiat for next period and use barrier protection until then,  Or check a UPT to confirm that she is not pregnant, and start on the following sunday

## 2015-01-11 ENCOUNTER — Encounter: Payer: Self-pay | Admitting: Internal Medicine

## 2015-01-11 ENCOUNTER — Ambulatory Visit (INDEPENDENT_AMBULATORY_CARE_PROVIDER_SITE_OTHER): Payer: BC Managed Care – PPO | Admitting: Internal Medicine

## 2015-01-11 VITALS — BP 126/80 | HR 95 | Temp 98.2°F | Resp 14 | Ht 60.0 in | Wt 162.0 lb

## 2015-01-11 DIAGNOSIS — E663 Overweight: Secondary | ICD-10-CM

## 2015-01-11 DIAGNOSIS — I10 Essential (primary) hypertension: Secondary | ICD-10-CM

## 2015-01-11 DIAGNOSIS — G5601 Carpal tunnel syndrome, right upper limb: Secondary | ICD-10-CM

## 2015-01-11 DIAGNOSIS — M47812 Spondylosis without myelopathy or radiculopathy, cervical region: Secondary | ICD-10-CM

## 2015-01-11 DIAGNOSIS — R5383 Other fatigue: Secondary | ICD-10-CM

## 2015-01-11 DIAGNOSIS — E538 Deficiency of other specified B group vitamins: Secondary | ICD-10-CM

## 2015-01-11 DIAGNOSIS — F411 Generalized anxiety disorder: Secondary | ICD-10-CM

## 2015-01-11 LAB — COMPREHENSIVE METABOLIC PANEL
ALBUMIN: 4.1 g/dL (ref 3.5–5.2)
ALT: 11 U/L (ref 0–35)
AST: 14 U/L (ref 0–37)
Alkaline Phosphatase: 55 U/L (ref 39–117)
BUN: 9 mg/dL (ref 6–23)
CO2: 27 mEq/L (ref 19–32)
Calcium: 9.3 mg/dL (ref 8.4–10.5)
Chloride: 103 mEq/L (ref 96–112)
Creatinine, Ser: 0.8 mg/dL (ref 0.40–1.20)
GFR: 80.04 mL/min (ref >60.00)
Glucose, Bld: 81 mg/dL (ref 70–99)
POTASSIUM: 4 meq/L (ref 3.5–5.1)
Sodium: 138 mEq/L (ref 135–145)
Total Bilirubin: 0.3 mg/dL (ref 0.2–1.2)
Total Protein: 6.5 g/dL (ref 6.0–8.3)

## 2015-01-11 LAB — TSH: TSH: 3.1 u[IU]/mL (ref 0.35–4.50)

## 2015-01-11 LAB — LIPID PANEL
Cholesterol: 169 mg/dL (ref 0–200)
HDL: 46.5 mg/dL (ref >39.00)
LDL Cholesterol: 87 mg/dL (ref 0–99)
NonHDL: 122.5
Total CHOL/HDL Ratio: 4
Triglycerides: 178 mg/dL — ABNORMAL HIGH (ref 0.0–149.0)
VLDL: 35.6 mg/dL (ref 0.0–40.0)

## 2015-01-11 LAB — VITAMIN B12: Vitamin B-12: 798 pg/mL (ref 211–911)

## 2015-01-11 MED ORDER — FLUTICASONE PROPIONATE 50 MCG/ACT NA SUSP: NASAL | Status: DC

## 2015-01-11 MED ORDER — LOSARTAN POTASSIUM 50 MG PO TABS: 50.0000 mg | ORAL_TABLET | Freq: Every day | ORAL | Status: DC

## 2015-01-11 MED ORDER — ESOMEPRAZOLE MAGNESIUM 40 MG PO CPDR: 40.0000 mg | DELAYED_RELEASE_CAPSULE | Freq: Two times a day (BID) | ORAL | Status: DC

## 2015-01-11 MED ORDER — HYDROCODONE-ACETAMINOPHEN 10-325 MG PO TABS: 1.0000 | ORAL_TABLET | Freq: Three times a day (TID) | ORAL | Status: DC | PRN

## 2015-01-11 MED ORDER — SE-TAN PLUS 162-115.2-1 MG PO CAPS: ORAL_CAPSULE | ORAL | Status: DC

## 2015-01-11 MED ORDER — CYANOCOBALAMIN 1000 MCG/ML IJ SOLN: 1000.0000 ug | INTRAMUSCULAR | Status: DC

## 2015-01-11 NOTE — Patient Instructions (Addendum)
Your blood pressure is well controlled   TRY TO FIND TIME TO EXERCISE TO HELP YOU RELAX
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
## 2015-01-11 NOTE — Progress Notes (Signed)
Pre-visit discussion using our clinic review tool. No additional management support is needed unless otherwise documented below in the visit note.  

## 2015-01-12 ENCOUNTER — Encounter: Payer: Self-pay | Admitting: Internal Medicine

## 2015-01-12 DIAGNOSIS — F411 Generalized anxiety disorder: Secondary | ICD-10-CM | POA: Insufficient documentation

## 2015-01-12 DIAGNOSIS — G5600 Carpal tunnel syndrome, unspecified upper limb: Secondary | ICD-10-CM | POA: Insufficient documentation

## 2015-01-12 NOTE — Assessment & Plan Note (Signed)
Aggravated by chronic pain and the stress of her career .  Continue lexapro for now,  Early retirement planned  Advised to start an exercise program to help prioritize her work/life balance and help her lose weight,

## 2015-01-12 NOTE — Assessment & Plan Note (Signed)
Well controlled on current regimen. Renal function stable, no changes today.  Lab Results  Component Value Date   CREATININE 0.80 01/11/2015   Lab Results  Component Value Date   NA 138 01/11/2015   K 4.0 01/11/2015   CL 103 01/11/2015   CO2 27 01/11/2015

## 2015-01-12 NOTE — Assessment & Plan Note (Signed)
I have addressed  Her weight gain, BMI and recommended a low glycemic index diet utilizing smaller more frequent meals to increase metabolism.  I have also recommended that patient start exercising with a goal of 30 minutes of aerobic exercise a minimum of 5 days per week. Screening for lipid disorders, thyroid and diabetes were done today with no signs of  Metabolic disease. Body mass index is 31.64 kg/(m^2).  Wt Readings from Last 3 Encounters:  01/11/15 162 lb (73.483 kg)  04/24/14 159 lb 8 oz (72.349 kg)  03/16/14 157 lb 4 oz (71.328 kg)

## 2015-01-12 NOTE — Assessment & Plan Note (Signed)
Right hand,  Early by EMG/[place] studies done by [facility].  Advid to esume wearing of  wrist brace at night to keep wrist in neutral  position

## 2015-01-12 NOTE — Assessment & Plan Note (Signed)
She did not tolerate meloxicam but is getting some relief of neck pain with daily celebrex.  EMG Studies have elicited concurrent early CTS right hand.

## 2015-01-14 ENCOUNTER — Telehealth: Payer: Self-pay | Admitting: Internal Medicine

## 2015-01-14 NOTE — Telephone Encounter (Signed)
emmi emailed °

## 2015-01-23 ENCOUNTER — Other Ambulatory Visit: Payer: Self-pay | Admitting: Internal Medicine

## 2015-02-04 ENCOUNTER — Telehealth: Payer: Self-pay

## 2015-02-04 NOTE — Telephone Encounter (Signed)
Received PA request from [facility] for nexium. PA done on cover my meds and PA was approved. Coverage is good from 01/05/15 to 02/04/15. Pharmacy notified.

## 2015-02-05 ENCOUNTER — Ambulatory Visit: Payer: Self-pay | Admitting: Internal Medicine

## 2015-03-12 ENCOUNTER — Encounter: Payer: Self-pay | Admitting: Internal Medicine

## 2015-06-10 ENCOUNTER — Telehealth: Payer: Self-pay | Admitting: *Deleted

## 2015-06-10 NOTE — Telephone Encounter (Signed)
Pharmacist called states Rx for Nexium needs a quantity limit PA as pt has used quantity limit for the year.  Please advise

## 2015-06-11 NOTE — Telephone Encounter (Signed)
PA completed online, pending response. 

## 2015-08-05 ENCOUNTER — Other Ambulatory Visit: Payer: Self-pay | Admitting: Internal Medicine

## 2015-08-05 NOTE — Telephone Encounter (Signed)
Ok to refill,  Refill sent  

## 2015-08-05 NOTE — Telephone Encounter (Signed)
Last PAP 5.30.14.  Please advise refill

## 2015-11-04 ENCOUNTER — Other Ambulatory Visit: Payer: Self-pay | Admitting: Internal Medicine

## 2015-12-03 ENCOUNTER — Telehealth: Payer: Self-pay | Admitting: *Deleted

## 2015-12-03 NOTE — Telephone Encounter (Signed)
Please advise refill, last OV was in February.

## 2015-12-03 NOTE — Telephone Encounter (Signed)
Patient has requested a medication refill for her vitamin B12.

## 2015-12-06 MED ORDER — CYANOCOBALAMIN 1000 MCG/ML IJ SOLN: 1000.0000 ug | INTRAMUSCULAR | Status: DC

## 2015-12-06 NOTE — Addendum Note (Signed)
Addended by: Sherlene ShamsULLO, Toula Miyasaki L on: 12/06/2015 09:04 AM   Modules accepted: Orders

## 2015-12-06 NOTE — Telephone Encounter (Signed)
Ok to refill,  Refill sent  

## 2015-12-07 ENCOUNTER — Ambulatory Visit (INDEPENDENT_AMBULATORY_CARE_PROVIDER_SITE_OTHER): Payer: BC Managed Care – PPO | Admitting: *Deleted

## 2015-12-07 DIAGNOSIS — D510 Vitamin B12 deficiency anemia due to intrinsic factor deficiency: Secondary | ICD-10-CM

## 2015-12-07 MED ORDER — CYANOCOBALAMIN 1000 MCG/ML IJ SOLN
1000.0000 ug | Freq: Once | INTRAMUSCULAR | Status: AC
  Administered 2015-12-07: 1000 ug via INTRAMUSCULAR

## 2015-12-07 NOTE — Progress Notes (Signed)
Patient presented for B 12 injection to left deltoid, patient tolerated well. 

## 2015-12-14 ENCOUNTER — Other Ambulatory Visit: Payer: Self-pay

## 2015-12-14 MED ORDER — ESOMEPRAZOLE MAGNESIUM 40 MG PO CPDR: 40.0000 mg | DELAYED_RELEASE_CAPSULE | Freq: Two times a day (BID) | ORAL | Status: DC

## 2016-01-17 ENCOUNTER — Encounter: Payer: Self-pay | Admitting: Internal Medicine

## 2016-01-17 ENCOUNTER — Other Ambulatory Visit (HOSPITAL_COMMUNITY)
Admission: RE | Admit: 2016-01-17 | Discharge: 2016-01-17 | Disposition: A | Discharge status: Home / Self Care | Payer: BC Managed Care – PPO | Source: Ambulatory Visit | Attending: Internal Medicine | Admitting: Internal Medicine

## 2016-01-17 ENCOUNTER — Ambulatory Visit (INDEPENDENT_AMBULATORY_CARE_PROVIDER_SITE_OTHER): Payer: BC Managed Care – PPO | Admitting: Internal Medicine

## 2016-01-17 VITALS — BP 118/68 | HR 96 | Temp 98.3°F | Resp 12 | Ht 59.75 in | Wt 154.8 lb

## 2016-01-17 DIAGNOSIS — G4733 Obstructive sleep apnea (adult) (pediatric): Secondary | ICD-10-CM | POA: Diagnosis not present

## 2016-01-17 DIAGNOSIS — I10 Essential (primary) hypertension: Secondary | ICD-10-CM | POA: Diagnosis not present

## 2016-01-17 DIAGNOSIS — E559 Vitamin D deficiency, unspecified: Secondary | ICD-10-CM

## 2016-01-17 DIAGNOSIS — Z23 Encounter for immunization: Secondary | ICD-10-CM

## 2016-01-17 DIAGNOSIS — R5383 Other fatigue: Secondary | ICD-10-CM | POA: Diagnosis not present

## 2016-01-17 DIAGNOSIS — M47812 Spondylosis without myelopathy or radiculopathy, cervical region: Secondary | ICD-10-CM

## 2016-01-17 DIAGNOSIS — Z0000 Encounter for general adult medical examination without abnormal findings: Secondary | ICD-10-CM

## 2016-01-17 DIAGNOSIS — Z01419 Encounter for gynecological examination (general) (routine) without abnormal findings: Secondary | ICD-10-CM | POA: Diagnosis present

## 2016-01-17 DIAGNOSIS — Z1151 Encounter for screening for human papillomavirus (HPV): Secondary | ICD-10-CM | POA: Insufficient documentation

## 2016-01-17 DIAGNOSIS — E538 Deficiency of other specified B group vitamins: Secondary | ICD-10-CM | POA: Diagnosis not present

## 2016-01-17 DIAGNOSIS — Z1159 Encounter for screening for other viral diseases: Secondary | ICD-10-CM

## 2016-01-17 DIAGNOSIS — E785 Hyperlipidemia, unspecified: Secondary | ICD-10-CM

## 2016-01-17 DIAGNOSIS — Z1239 Encounter for other screening for malignant neoplasm of breast: Secondary | ICD-10-CM

## 2016-01-17 DIAGNOSIS — Z124 Encounter for screening for malignant neoplasm of cervix: Secondary | ICD-10-CM

## 2016-01-17 DIAGNOSIS — D510 Vitamin B12 deficiency anemia due to intrinsic factor deficiency: Secondary | ICD-10-CM

## 2016-01-17 DIAGNOSIS — E663 Overweight: Secondary | ICD-10-CM

## 2016-01-17 LAB — COMPREHENSIVE METABOLIC PANEL
ALBUMIN: 4.3 g/dL (ref 3.5–5.2)
ALT: 8 U/L (ref 0–35)
AST: 14 U/L (ref 0–37)
Alkaline Phosphatase: 50 U/L (ref 39–117)
BILIRUBIN TOTAL: 0.4 mg/dL (ref 0.2–1.2)
BUN: 9 mg/dL (ref 6–23)
CALCIUM: 9.4 mg/dL (ref 8.4–10.5)
CO2: 29 mEq/L (ref 19–32)
CREATININE: 0.81 mg/dL (ref 0.40–1.20)
Chloride: 101 mEq/L (ref 96–112)
GFR: 78.59 mL/min (ref >60.00)
Glucose, Bld: 88 mg/dL (ref 70–99)
Potassium: 3.9 mEq/L (ref 3.5–5.1)
Sodium: 138 mEq/L (ref 135–145)
TOTAL PROTEIN: 6.5 g/dL (ref 6.0–8.3)

## 2016-01-17 LAB — CBC WITH DIFFERENTIAL/PLATELET
BASOS ABS: 0 10*3/uL (ref 0.0–0.1)
Basophils Relative: 0.2 % (ref 0.0–3.0)
EOS PCT: 1.6 % (ref 0.0–5.0)
Eosinophils Absolute: 0.1 10*3/uL (ref 0.0–0.7)
HCT: 37.8 % (ref 36.0–46.0)
Hemoglobin: 12.5 g/dL (ref 12.0–15.0)
LYMPHS ABS: 2 10*3/uL (ref 0.7–4.0)
Lymphocytes Relative: 22.3 % (ref 12.0–46.0)
MCHC: 33.2 g/dL (ref 30.0–36.0)
MCV: 91.3 fl (ref 78.0–100.0)
MONO ABS: 0.4 10*3/uL (ref 0.1–1.0)
MONOS PCT: 4.4 % (ref 3.0–12.0)
NEUTROS ABS: 6.5 10*3/uL (ref 1.4–7.7)
NEUTROS PCT: 71.5 % (ref 43.0–77.0)
PLATELETS: 360 10*3/uL (ref 150.0–400.0)
RBC: 4.13 Mil/uL (ref 3.87–5.11)
RDW: 13.3 % (ref 11.5–15.5)
WBC: 9.1 10*3/uL (ref 4.0–10.5)

## 2016-01-17 LAB — LIPID PANEL
CHOLESTEROL: 175 mg/dL (ref 0–200)
HDL: 42 mg/dL (ref >39.00)
LDL Cholesterol: 107 mg/dL — ABNORMAL HIGH (ref 0–99)
NONHDL: 132.81
Total CHOL/HDL Ratio: 4
Triglycerides: 127 mg/dL (ref 0.0–149.0)
VLDL: 25.4 mg/dL (ref 0.0–40.0)

## 2016-01-17 LAB — VITAMIN D 25 HYDROXY (VIT D DEFICIENCY, FRACTURES): VITD: 20.98 ng/mL — AB (ref 30.00–100.00)

## 2016-01-17 LAB — TSH: TSH: 1.79 u[IU]/mL (ref 0.35–4.50)

## 2016-01-17 MED ORDER — CYANOCOBALAMIN 1000 MCG/ML IJ SOLN
1000.0000 ug | Freq: Once | INTRAMUSCULAR | Status: AC
  Administered 2016-01-17: 1000 ug via INTRAMUSCULAR

## 2016-01-17 NOTE — Patient Instructions (Signed)
Health Maintenance, Female Adopting a healthy lifestyle and getting preventive care can go a long way to promote health and wellness. Talk with your health care provider about what schedule of regular examinations is right for you. This is a good chance for you to check in with your provider about disease prevention and staying healthy. In between checkups, there are plenty of things you can do on your own. Experts have done a lot of research about which lifestyle changes and preventive measures are most likely to keep you healthy. Ask your health care provider for more information. WEIGHT AND DIET  Eat a healthy diet  Be sure to include plenty of vegetables, fruits, low-fat dairy products, and lean protein.  Do not eat a lot of foods high in solid fats, added sugars, or salt.  Get regular exercise. This is one of the most important things you can do for your health.  Most adults should exercise for at least 150 minutes each week. The exercise should increase your heart rate and make you sweat (moderate-intensity exercise).  Most adults should also do strengthening exercises at least twice a week. This is in addition to the moderate-intensity exercise.  Maintain a healthy weight  Body mass index (BMI) is a measurement that can be used to identify possible weight problems. It estimates body fat based on height and weight. Your health care provider can help determine your BMI and help you achieve or maintain a healthy weight.  For females 20 years of age and older:   A BMI below 18.5 is considered underweight.  A BMI of 18.5 to 24.9 is normal.  A BMI of 25 to 29.9 is considered overweight.  A BMI of 30 and above is considered obese.  Watch levels of cholesterol and blood lipids  You should start having your blood tested for lipids and cholesterol at 53 years of age, then have this test every 5 years.  You may need to have your cholesterol levels checked more often if:  Your lipid  or cholesterol levels are high.  You are older than 53 years of age.  You are at high risk for heart disease.  CANCER SCREENING   Lung Cancer  Lung cancer screening is recommended for adults 55-80 years old who are at high risk for lung cancer because of a history of smoking.  A yearly low-dose CT scan of the lungs is recommended for people who:  Currently smoke.  Have quit within the past 15 years.  Have at least a 30-pack-year history of smoking. A pack year is smoking an average of one pack of cigarettes a day for 1 year.  Yearly screening should continue until it has been 15 years since you quit.  Yearly screening should stop if you develop a health problem that would prevent you from having lung cancer treatment.  Breast Cancer  Practice breast self-awareness. This means understanding how your breasts normally appear and feel.  It also means doing regular breast self-exams. Let your health care provider know about any changes, no matter how small.  If you are in your 20s or 30s, you should have a clinical breast exam (CBE) by a health care provider every 1-3 years as part of a regular health exam.  If you are 40 or older, have a CBE every year. Also consider having a breast X-ray (mammogram) every year.  If you have a family history of breast cancer, talk to your health care provider about genetic screening.  If you   are at high risk for breast cancer, talk to your health care provider about having an MRI and a mammogram every year.  Breast cancer gene (BRCA) assessment is recommended for women who have family members with BRCA-related cancers. BRCA-related cancers include:  Breast.  Ovarian.  Tubal.  Peritoneal cancers.  Results of the assessment will determine the need for genetic counseling and BRCA1 and BRCA2 testing. Cervical Cancer Your health care provider may recommend that you be screened regularly for cancer of the pelvic organs (ovaries, uterus, and  vagina). This screening involves a pelvic examination, including checking for microscopic changes to the surface of your cervix (Pap test). You may be encouraged to have this screening done every 3 years, beginning at age 21.  For women ages 30-65, health care providers may recommend pelvic exams and Pap testing every 3 years, or they may recommend the Pap and pelvic exam, combined with testing for human papilloma virus (HPV), every 5 years. Some types of HPV increase your risk of cervical cancer. Testing for HPV may also be done on women of any age with unclear Pap test results.  Other health care providers may not recommend any screening for nonpregnant women who are considered low risk for pelvic cancer and who do not have symptoms. Ask your health care provider if a screening pelvic exam is right for you.  If you have had past treatment for cervical cancer or a condition that could lead to cancer, you need Pap tests and screening for cancer for at least 20 years after your treatment. If Pap tests have been discontinued, your risk factors (such as having a new sexual partner) need to be reassessed to determine if screening should resume. Some women have medical problems that increase the chance of getting cervical cancer. In these cases, your health care provider may recommend more frequent screening and Pap tests. Colorectal Cancer  This type of cancer can be detected and often prevented.  Routine colorectal cancer screening usually begins at 53 years of age and continues through 53 years of age.  Your health care provider may recommend screening at an earlier age if you have risk factors for colon cancer.  Your health care provider may also recommend using home test kits to check for hidden blood in the stool.  A small camera at the end of a tube can be used to examine your colon directly (sigmoidoscopy or colonoscopy). This is done to check for the earliest forms of colorectal  cancer.  Routine screening usually begins at age 50.  Direct examination of the colon should be repeated every 5-10 years through 53 years of age. However, you may need to be screened more often if early forms of precancerous polyps or small growths are found. Skin Cancer  Check your skin from head to toe regularly.  Tell your health care provider about any new moles or changes in moles, especially if there is a change in a mole's shape or color.  Also tell your health care provider if you have a mole that is larger than the size of a pencil eraser.  Always use sunscreen. Apply sunscreen liberally and repeatedly throughout the day.  Protect yourself by wearing long sleeves, pants, a wide-brimmed hat, and sunglasses whenever you are outside. HEART DISEASE, DIABETES, AND HIGH BLOOD PRESSURE   High blood pressure causes heart disease and increases the risk of stroke. High blood pressure is more likely to develop in:  People who have blood pressure in the high end   of the normal range (130-139/85-89 mm Hg).  People who are overweight or obese.  People who are African American.  If you are 38-23 years of age, have your blood pressure checked every 3-5 years. If you are 61 years of age or older, have your blood pressure checked every year. You should have your blood pressure measured twice--once when you are at a hospital or clinic, and once when you are not at a hospital or clinic. Record the average of the two measurements. To check your blood pressure when you are not at a hospital or clinic, you can use:  An automated blood pressure machine at a pharmacy.  A home blood pressure monitor.  If you are between 45 years and 39 years old, ask your health care provider if you should take aspirin to prevent strokes.  Have regular diabetes screenings. This involves taking a blood sample to check your fasting blood sugar level.  If you are at a normal weight and have a low risk for diabetes,  have this test once every three years after 53 years of age.  If you are overweight and have a high risk for diabetes, consider being tested at a younger age or more often. PREVENTING INFECTION  Hepatitis B  If you have a higher risk for hepatitis B, you should be screened for this virus. You are considered at high risk for hepatitis B if:  You were born in a country where hepatitis B is common. Ask your health care provider which countries are considered high risk.  Your parents were born in a high-risk country, and you have not been immunized against hepatitis B (hepatitis B vaccine).  You have HIV or AIDS.  You use needles to inject street drugs.  You live with someone who has hepatitis B.  You have had sex with someone who has hepatitis B.  You get hemodialysis treatment.  You take certain medicines for conditions, including cancer, organ transplantation, and autoimmune conditions. Hepatitis C  Blood testing is recommended for:  Everyone born from [birth year] through 1963.  Anyone with known risk factors for hepatitis C. Sexually transmitted infections (STIs)  You should be screened for sexually transmitted infections (STIs) including gonorrhea and chlamydia if:  You are sexually active and are younger than 53 years of age.  You are older than 53 years of age and your health care provider tells you that you are at risk for this type of infection.  Your sexual activity has changed since you were last screened and you are at an increased risk for chlamydia or gonorrhea. Ask your health care provider if you are at risk.  If you do not have HIV, but are at risk, it may be recommended that you take a prescription medicine daily to prevent HIV infection. This is called pre-exposure prophylaxis (PrEP). You are considered at risk if:  You are sexually active and do not regularly use condoms or know the HIV status of your partner(s).  You take drugs by injection.  You are sexually  active with a partner who has HIV. Talk with your health care provider about whether you are at high risk of being infected with HIV. If you choose to begin PrEP, you should first be tested for HIV. You should then be tested every 3 months for as long as you are taking PrEP.  PREGNANCY   If you are premenopausal and you may become pregnant, ask your health care provider about preconception counseling.  If you may  become pregnant, take 400 to 800 micrograms (mcg) of folic acid every day.  If you want to prevent pregnancy, talk to your health care provider about birth control (contraception). OSTEOPOROSIS AND MENOPAUSE   Osteoporosis is a disease in which the bones lose minerals and strength with aging. This can result in serious bone fractures. Your risk for osteoporosis can be identified using a bone density scan.  If you are 61 years of age or older, or if you are at risk for osteoporosis and fractures, ask your health care provider if you should be screened.  Ask your health care provider whether you should take a calcium or vitamin D supplement to lower your risk for osteoporosis.  Menopause may have certain physical symptoms and risks.  Hormone replacement therapy may reduce some of these symptoms and risks. Talk to your health care provider about whether hormone replacement therapy is right for you.  HOME CARE INSTRUCTIONS   Schedule regular health, dental, and eye exams.  Stay current with your immunizations.   Do not use any tobacco products including cigarettes, chewing tobacco, or electronic cigarettes.  If you are pregnant, do not drink alcohol.  If you are breastfeeding, limit how much and how often you drink alcohol.  Limit alcohol intake to no more than 1 drink per day for nonpregnant women. One drink equals 12 ounces of beer, 5 ounces of wine, or 1 ounces of hard liquor.  Do not use street drugs.  Do not share needles.  Ask your health care provider for help if  you need support or information about quitting drugs.  Tell your health care provider if you often feel depressed.  Tell your health care provider if you have ever been abused or do not feel safe at home.   This information is not intended to replace advice given to you by your health care provider. Make sure you discuss any questions you have with your health care provider.   Document Released: 06/05/2011 Document Revised: 12/11/2014 Document Reviewed: 10/22/2013 Elsevier Interactive Patient Education [employer].

## 2016-01-17 NOTE — Progress Notes (Signed)
Pre visit review using our clinic review tool, if applicable. No additional management support is needed unless otherwise documented below in the visit note. 

## 2016-01-17 NOTE — Progress Notes (Signed)
Patient ID: Destiny Martin, female    DOB: 1960-12-21  Age: 55 y.o. MRN: [id number]  The patient is here for annual  wellness examination and management of other chronic and acute problems.   The risk factors are reflected in the social history.  The roster of all physicians providing medical care to patient - is listed in the Snapshot section of the chart.  Activities of daily living:  The patient is 100% independent in all ADLs: dressing, toileting, feeding as well as independent mobility  Home safety : The patient has smoke detectors in the home. They wear seatbelts.  There are no firearms at home. There is no violence in the home.   There is no risks for hepatitis, STDs or HIV. There is no   history of blood transfusion. They have no travel history to infectious disease endemic areas of the world.  The patient has seen their dentist in the last six month. They have seen their eye doctor in the last year. They admit to slight hearing difficulty with regard to whispered voices and some television programs.  They have deferred audiologic testing in the last year.  They do not  have excessive sun exposure. Discussed the need for sun protection: hats, long sleeves and use of sunscreen if there is significant sun exposure.   Diet: the importance of a healthy diet is discussed. They do have a healthy diet.  The benefits of regular aerobic exercise were discussed. She walks 4 times per week ,  20 minutes.   Depression screen: there are no signs or vegative symptoms of depression- irritability, change in appetite, anhedonia, sadness/tearfullness.  Cognitive assessment: the patient manages all their financial and personal affairs and is actively engaged. They could relate day,date,year and events; recalled 2/3 objects at 3 minutes; performed clock-face test normally.  The following portions of the patient's history were reviewed and updated as appropriate: allergies, current medications, past  family history, past medical history,  past surgical history, past social history  and problem list.  Visual acuity was not assessed per patient preference since she has regular follow up with her ophthalmologist. Hearing and body mass index were assessed and reviewed.   During the course of the visit the patient was educated and counseled about appropriate screening and preventive services including : fall prevention , diabetes screening, nutrition counseling, colorectal cancer screening, and recommended immunizations.    CC: The primary encounter diagnosis was Encounter for preventive health examination. Diagnoses of Need for prophylactic vaccination and inoculation against influenza, B12 deficiency, Obstructive sleep apnea, Other fatigue, Hyperlipidemia, Screening for cervical cancer, Vitamin D deficiency, Need for hepatitis C screening test, Breast cancer screening, Essential hypertension, Cervical spondylosis without myelopathy, Overweight (BMI 25.0-29.9), and Pernicious anemia were also pertinent to this visit.   Underwent  cervical decompression for neck pain and right arm radiculopathy secondary to herniated disk .  [facility] in December . Sleep apnea was noted during recovery .  Last sleep study was done 4 years ago. But she never returned for the CPAP titration study, . First one was ordered by [facility] .  Prescribedand taking Adderall for ADD diagnosis made by Dr Sharl Ma.    History Jaisa has a past medical history of Hypertension and ADD (attention deficit disorder).   She has no past surgical history on file.   Her family history includes Heart disease in her father; Hypertension in her father.She reports that she has never smoked. She has never used smokeless tobacco. She  reports that she drinks alcohol. She reports that she does not use illicit drugs.  Outpatient Prescriptions Prior to Visit  Medication Sig Dispense Refill  . amphetamine-dextroamphetamine  (ADDERALL XR) 30 MG 24 hr capsule Take 1 capsule by mouth daily.    Marland Kitchen amphetamine-dextroamphetamine (ADDERALL) 30 MG tablet Take 30 mg by mouth daily.    . celecoxib (CELEBREX) 200 MG capsule 1 capsule.    . chlorpheniramine-HYDROcodone (TUSSIONEX) 10-8 MG/5ML LQCR Take 5 mLs by mouth every 12 (twelve) hours. 200 mL 0  . cyanocobalamin (,VITAMIN B-12,) 1000 MCG/ML injection Inject 1 mL (1,000 mcg total) into the muscle every 30 (thirty) days. 10 mL 1  . escitalopram (LEXAPRO) 20 MG tablet Take 1 tablet by mouth daily.    Marland Kitchen esomeprazole (NEXIUM) 40 MG capsule Take 1 capsule (40 mg total) by mouth 2 (two) times daily. 180 capsule 0  . FeFum-FePo-FA-B Cmp-C-Zn-Mn-Cu (SE-TAN PLUS) 162-115.2-1 MG CAPS TAKE 1 CAPSULE BY MOUTH DAILY AFTER SUPPER. 30 capsule 5  . fluticasone (FLONASE) 50 MCG/ACT nasal spray PLACE 2 SPRAYS INTO THE NOSE DAILY. 16 g 5  . HYDROcodone-acetaminophen (NORCO) 10-325 MG per tablet Take 1 tablet by mouth every 8 (eight) hours as needed. 120 tablet 0  . JUNEL FE 1/20 1-20 MG-MCG tablet TAKE 1 TABLET BY MOUTH EVERY DAY 28 tablet 1  . losartan (COZAAR) 50 MG tablet Take 1 tablet (50 mg total) by mouth daily. 90 tablet 3  . methocarbamol (ROBAXIN) 500 MG tablet Take 1 tablet (500 mg total) by mouth 4 (four) times daily. 90 tablet 0  . Syringe, Disposable, 1 ML MISC Use monthly for B12 injections 25 each 2  . URELLE (URELLE/URISED) 81 MG TABS Take 1 tablet (81 mg total) by mouth 2 (two) times daily. 10 each 3   No facility-administered medications prior to visit.    Review of Systems   Patient denies headache, fevers, malaise, unintentional weight loss, skin rash, eye pain, sinus congestion and sinus pain, sore throat, dysphagia,  hemoptysis , cough, dyspnea, wheezing, chest pain, palpitations, orthopnea, edema, abdominal pain, nausea, melena, diarrhea, constipation, flank pain, dysuria, hematuria, urinary  Frequency, nocturia, numbness, tingling, seizures,  Focal weakness, Loss of  consciousness,  Tremor, insomnia, depression, anxiety, and suicidal ideation.      Objective:  BP 118/68 mmHg  Pulse 96  Temp(Src) 98.3 F (36.8 C) (Oral)  Resp 12  Ht 4' 11.75" (1.518 m)  Wt 154 lb 12.8 oz (70.217 kg)  BMI 30.47 kg/m2  SpO2 99%  Physical Exam   General Appearance:    Alert, cooperative, no distress, appears stated age  Head:    Normocephalic, without obvious abnormality, atraumatic  Eyes:    PERRL, conjunctiva/corneas clear, EOM's intact, fundi    benign, both eyes  Ears:    Normal TM's and external ear canals, both ears  Nose:   Nares normal, septum midline, mucosa normal, no drainage    or sinus tenderness  Throat:   Lips, mucosa, and tongue normal; teeth and gums normal  Neck:   Supple, symmetrical, trachea midline, no adenopathy;    thyroid:  no enlargement/tenderness/nodules; no carotid   bruit or JVD  Back:     Symmetric, no curvature, ROM normal, no CVA tenderness  Lungs:     Clear to auscultation bilaterally, respirations unlabored  Chest Wall:    No tenderness or deformity   Heart:    Regular rate and rhythm, S1 and S2 normal, no murmur, rub   or gallop  Breast  Exam:    No tenderness, masses, or nipple abnormality  Abdomen:     Soft, non-tender, bowel sounds active all four quadrants,    no masses, no organomegaly  Genitalia:    Pelvic: cervix normal in appearance, external genitalia normal, no adnexal masses or tenderness, no cervical motion tenderness, rectovaginal septum normal, uterus normal size, shape, and consistency and vagina normal without discharge  Extremities:   Extremities normal, atraumatic, no cyanosis or edema  Pulses:   2+ and symmetric all extremities  Skin:   Skin color, texture, turgor normal, no rashes or lesions  Lymph nodes:   Cervical, supraclavicular, and axillary nodes normal  Neurologic:   CNII-XII intact, normal strength, sensation and reflexes    throughout       Assessment & Plan:   Problem List Items  Addressed This Visit    Hypertension    Well controlled on current regimen. Renal function stable, no changes today.  Lab Results  Component Value Date   CREATININE 0.81 01/17/2016   Lab Results  Component Value Date   NA 138 01/17/2016   K 3.9 01/17/2016   CL 101 01/17/2016   CO2 29 01/17/2016           Overweight (BMI 25.0-29.9)    Body mass index is 30.47 kg/(m^2). I have addressed  BMI and recommended wt loss of 10% of body weight over the next 6 months using a low glycemic index diet and regular exercise a minimum of 5 days per week.        Encounter for preventive health examination - Primary    Annual comprehensive preventive exam was done as well as an evaluation and management of chronic conditions .  During the course of the visit the patient was educated and counseled about appropriate screening and preventive services including :  diabetes screening, lipid analysis with projected  10 year  risk for CAD , nutrition counseling, breast, cervical and colorectal cancer screening, and recommended immunizations.  Printed recommendations for health maintenance screenings was given.  Lab Results  Component Value Date   CHOL 175 01/17/2016   HDL 42.00 01/17/2016   LDLCALC 107* 01/17/2016   TRIG 127.0 01/17/2016   CHOLHDL 4 01/17/2016   Lab Results  Component Value Date   TSH 1.79 01/17/2016         Pernicious anemia    Repeat CBC normal post supplementation with  B12 and iron .  Lab Results  Component Value Date   WBC 9.1 01/17/2016   HGB 12.5 01/17/2016   HCT 37.8 01/17/2016   MCV 91.3 01/17/2016   PLT 360.0 01/17/2016           Relevant Medications   cyanocobalamin ((VITAMIN B-12)) injection 1,000 mcg (Completed)   Obstructive sleep apnea    Diagnosed but never treated. Apneic events were noted during post operative period from cervical spine decompression sugery in December,  Repeat study ordered, will need ambien       Relevant Orders    Nocturnal polysomnography   Cervical spondylosis without myelopathy    S/p decompression by [facility] in Dec 2016. Records requested.  Radiculopathy resolved. Recover time was prolonged.         Other Visit Diagnoses    Need for prophylactic vaccination and inoculation against influenza        Relevant Orders    Flu Vaccine QUAD 36+ mos PF IM (Fluarix & Fluzone Quad PF) (Completed)    B12 deficiency  Relevant Medications    cyanocobalamin ((VITAMIN B-12)) injection 1,000 mcg (Completed)    Other fatigue        Relevant Orders    Comprehensive metabolic panel (Completed)    TSH (Completed)    CBC with Differential/Platelet (Completed)    Hyperlipidemia        Relevant Orders    Lipid panel (Completed)    Screening for cervical cancer        Relevant Orders    Cytology - PAP    Vitamin D deficiency        Relevant Orders    VITAMIN D 25 Hydroxy (Vit-D Deficiency, Fractures) (Completed)    Need for hepatitis C screening test        Relevant Orders    Hepatitis C antibody (Completed)    Breast cancer screening        Relevant Orders    MM DIGITAL SCREENING BILATERAL       I am having Ms. Aundria Rud maintain her chlorpheniramine-HYDROcodone, URELLE, Syringe (Disposable), amphetamine-dextroamphetamine, methocarbamol, escitalopram, amphetamine-dextroamphetamine, HYDROcodone-acetaminophen, SE-TAN PLUS, losartan, fluticasone, celecoxib, JUNEL FE 1/20, cyanocobalamin, and esomeprazole. We administered cyanocobalamin.  Meds ordered this encounter  Medications  . cyanocobalamin ((VITAMIN B-12)) injection 1,000 mcg    Sig:     There are no discontinued medications.  Follow-up: Return in about 6 months (around 07/16/2016).   Sherlene Shams, MD

## 2016-01-18 LAB — HEPATITIS C ANTIBODY: HCV Ab: NEGATIVE

## 2016-01-18 LAB — CYTOLOGY - PAP

## 2016-01-18 NOTE — Assessment & Plan Note (Signed)
S/p decompression by [facility] in Dec 2016. Records requested.  Radiculopathy resolved. Recover time was prolonged.

## 2016-01-18 NOTE — Assessment & Plan Note (Signed)
Body mass index is 30.47 kg/(m^2). I have addressed  BMI and recommended wt loss of 10% of body weight over the next 6 months using a low glycemic index diet and regular exercise a minimum of 5 days per week.

## 2016-01-18 NOTE — Assessment & Plan Note (Signed)
Diagnosed but never treated. Apneic events were noted during post operative period from cervical spine decompression sugery in December,  Repeat study ordered, will need [place]

## 2016-01-18 NOTE — Assessment & Plan Note (Signed)
Well controlled on current regimen. Renal function stable, no changes today.  Lab Results  Component Value Date   CREATININE 0.81 01/17/2016   Lab Results  Component Value Date   NA 138 01/17/2016   K 3.9 01/17/2016   CL 101 01/17/2016   CO2 29 01/17/2016

## 2016-01-18 NOTE — Assessment & Plan Note (Addendum)
Annual comprehensive preventive exam was done as well as an evaluation and management of chronic conditions .  During the course of the visit the patient was educated and counseled about appropriate screening and preventive services including :  diabetes screening, lipid analysis with projected  10 year  risk for CAD , nutrition counseling, breast, cervical and colorectal cancer screening, and recommended immunizations.  Printed recommendations for health maintenance screenings was given.  Lab Results  Component Value Date   CHOL 175 01/17/2016   HDL 42.00 01/17/2016   LDLCALC 107* 01/17/2016   TRIG 127.0 01/17/2016   CHOLHDL 4 01/17/2016   Lab Results  Component Value Date   TSH 1.79 01/17/2016

## 2016-01-18 NOTE — Assessment & Plan Note (Signed)
Repeat CBC normal post supplementation with  B12 and iron .  Lab Results  Component Value Date   WBC 9.1 01/17/2016   HGB 12.5 01/17/2016   HCT 37.8 01/17/2016   MCV 91.3 01/17/2016   PLT 360.0 01/17/2016

## 2016-01-19 ENCOUNTER — Encounter: Payer: Self-pay | Admitting: Internal Medicine

## 2016-01-19 ENCOUNTER — Other Ambulatory Visit: Payer: Self-pay | Admitting: Internal Medicine

## 2016-01-19 MED ORDER — ERGOCALCIFEROL 1.25 MG (50000 UT) PO CAPS: 50000.0000 [IU] | ORAL_CAPSULE | ORAL | Status: DC

## 2016-02-10 ENCOUNTER — Other Ambulatory Visit: Payer: Self-pay | Admitting: Internal Medicine

## 2016-02-28 ENCOUNTER — Encounter: Payer: Self-pay | Admitting: Internal Medicine

## 2016-02-28 NOTE — Telephone Encounter (Signed)
Please advise as to Sleep study

## 2016-03-02 ENCOUNTER — Other Ambulatory Visit: Payer: Self-pay | Admitting: Internal Medicine

## 2016-03-04 ENCOUNTER — Other Ambulatory Visit: Payer: Self-pay | Admitting: Internal Medicine

## 2016-05-02 ENCOUNTER — Other Ambulatory Visit: Payer: Self-pay | Admitting: Internal Medicine

## 2016-05-11 ENCOUNTER — Other Ambulatory Visit: Payer: Self-pay | Admitting: Internal Medicine

## 2016-05-12 ENCOUNTER — Other Ambulatory Visit: Payer: Self-pay | Admitting: *Deleted

## 2016-05-12 MED ORDER — FLUTICASONE PROPIONATE 50 MCG/ACT NA SUSP: NASAL | Status: DC

## 2016-05-12 MED ORDER — ESOMEPRAZOLE MAGNESIUM 40 MG PO CPDR: 40.0000 mg | DELAYED_RELEASE_CAPSULE | Freq: Two times a day (BID) | ORAL | Status: DC

## 2016-05-18 ENCOUNTER — Encounter: Payer: Self-pay | Admitting: Internal Medicine

## 2016-05-19 NOTE — Telephone Encounter (Signed)
Can we [facility]? [facility]

## 2016-05-23 NOTE — Telephone Encounter (Signed)
I took care of the testosterone question.

## 2016-05-23 NOTE — Telephone Encounter (Signed)
Olegario MessierKathy, I will work on the u/s at the [facility]. Can you help with her other request? Thx! MT

## 2016-06-17 ENCOUNTER — Other Ambulatory Visit: Payer: Self-pay | Admitting: Internal Medicine

## 2016-07-17 ENCOUNTER — Ambulatory Visit (INDEPENDENT_AMBULATORY_CARE_PROVIDER_SITE_OTHER): Payer: BC Managed Care – PPO | Admitting: Internal Medicine

## 2016-07-17 VITALS — BP 124/78 | HR 68 | Resp 16 | Wt 154.0 lb

## 2016-07-17 DIAGNOSIS — K1379 Other lesions of oral mucosa: Secondary | ICD-10-CM

## 2016-07-17 DIAGNOSIS — Z3040 Encounter for surveillance of contraceptives, unspecified: Secondary | ICD-10-CM | POA: Diagnosis not present

## 2016-07-17 DIAGNOSIS — K2100 Gastro-esophageal reflux disease with esophagitis, without bleeding: Secondary | ICD-10-CM

## 2016-07-17 DIAGNOSIS — E663 Overweight: Secondary | ICD-10-CM

## 2016-07-17 DIAGNOSIS — F5102 Adjustment insomnia: Secondary | ICD-10-CM

## 2016-07-17 DIAGNOSIS — E538 Deficiency of other specified B group vitamins: Secondary | ICD-10-CM | POA: Diagnosis not present

## 2016-07-17 DIAGNOSIS — M47812 Spondylosis without myelopathy or radiculopathy, cervical region: Secondary | ICD-10-CM

## 2016-07-17 DIAGNOSIS — I10 Essential (primary) hypertension: Secondary | ICD-10-CM

## 2016-07-17 LAB — POCT URINE PREGNANCY: Preg Test, Ur: NEGATIVE

## 2016-07-17 MED ORDER — MOMETASONE FUROATE 50 MCG/ACT NA SUSP: 2.0000 | Freq: Every day | NASAL | 12 refills | Status: DC

## 2016-07-17 MED ORDER — CYANOCOBALAMIN 1000 MCG/ML IJ SOLN
1000.0000 ug | Freq: Once | INTRAMUSCULAR | Status: AC
  Administered 2016-07-17: 1000 ug via INTRAMUSCULAR

## 2016-07-17 MED ORDER — CELECOXIB 200 MG PO CAPS: 200.0000 mg | ORAL_CAPSULE | Freq: Two times a day (BID) | ORAL | 1 refills | Status: DC

## 2016-07-17 MED ORDER — NORETHIN ACE-ETH ESTRAD-FE 1-20 MG-MCG PO TABS: 1.0000 | ORAL_TABLET | Freq: Every day | ORAL | 2 refills | Status: DC

## 2016-07-17 MED ORDER — LIDOCAINE VISCOUS 2 % MT SOLN: 20.0000 mL | OROMUCOSAL | 0 refills | Status: DC | PRN

## 2016-07-17 MED ORDER — HYDROCODONE-ACETAMINOPHEN 10-325 MG PO TABS: 1.0000 | ORAL_TABLET | Freq: Three times a day (TID) | ORAL | 0 refills | Status: DC | PRN

## 2016-07-17 MED ORDER — TRAZODONE HCL 50 MG PO TABS: 25.0000 mg | ORAL_TABLET | Freq: Every evening | ORAL | 3 refills | Status: DC | PRN

## 2016-07-17 NOTE — Progress Notes (Signed)
Subjective:  Patient ID: Destiny Martin, female    DOB: 1961/11/10  Age: 55 y.o. MRN: [id number]  CC: The primary encounter diagnosis was Encounter for surveillance of contraceptives. Diagnoses of Vitamin B12 deficiency, Pain in mouth, Insomnia due to stress, Essential hypertension, Cervical spondylosis without myelopathy, Overweight (BMI 25.0-29.9), and Gastroesophageal reflux disease with esophagitis were also pertinent to this visit.  HPI [name] presents for follow up on chronic conditions including hypertension,  b12and Vitamin D  deficiency,  Shoulder pain and obesity .   Vit D deficiency: she finished the weekly 3 month trial of Drisdol in May,  Has not been taking any Vit D vc she did not re read her e mail and the office was unable to provider her a satisfactory answer (she was told to ask her pharmacist , who was unable to tell her what to take )  ADD/anxiety  Her Psychiatrist is  prescribing meds for ADD and her SSRI . Takes prozac for depression.  Intermittently having insomnia . benadryl causes agitation and restless legs periodically.  Symptoms are aggravated by life stressors including husband's health and changes in legislation affecting her work in [employer] .  Discussed trial of trazodone   Her Right arm numbness has returned.  She underwent decompression of cervical spine  DDD in  Dec 2016 with transient improvement.  Has seen N/s since then and told there were no new impngement issues.  Hypertension: patient checks blood pressure twice weekly at home.  Readings have been for the most part < 140/80 at rest . Patient is following a reduced salt diet most days and is taking medications as prescribed  .  Lost weight earlier in the year,  Has kept it off but unable to lose more. Rl]  Oral contraception:  Has been forgetful and inconsistent with oral contraception.  Some hot flashes noted,     Mouth pain: had root canal on left mandible several weeks ago,,  Still  painful on left side.  No drainage,  Has had follow up with no complications noted.   Outpatient Medications Prior to Visit  Medication Sig Dispense Refill  . amphetamine-dextroamphetamine (ADDERALL XR) 30 MG 24 hr capsule Take 1 capsule by mouth daily.    Marland Kitchen amphetamine-dextroamphetamine (ADDERALL) 30 MG tablet Take 30 mg by mouth daily.    . cyanocobalamin (,VITAMIN B-12,) 1000 MCG/ML injection INJECT 1 ML (1,000 MCG TOTAL) INTO THE MUSCLE EVERY 30 (THIRTY) DAYS. 10 mL 0  . escitalopram (LEXAPRO) 20 MG tablet Take 1 tablet by mouth daily.    Marland Kitchen esomeprazole (NEXIUM) 40 MG capsule TAKE 1 CAPSULE (40 MG TOTAL) BY MOUTH 2 (TWO) TIMES DAILY BEFORE A MEAL. 60 capsule 3  . FeFum-FePo-FA-B Cmp-C-Zn-Mn-Cu (SE-TAN PLUS) 162-115.2-1 MG CAPS TAKE 1 CAPSULE BY MOUTH DAILY AFTER SUPPER. 30 capsule 5  . fluticasone (FLONASE) 50 MCG/ACT nasal spray INHALE 1 SPRAY BY INTRANASAL ROUTE EVERY DAY IN EACH NOSTRIL 16 g 0  . losartan (COZAAR) 50 MG tablet TAKE 1 TABLET (50 MG TOTAL) BY MOUTH DAILY. 90 tablet 1  . Syringe, Disposable, 1 ML MISC Use monthly for B12 injections 25 each 2  . URELLE (URELLE/URISED) 81 MG TABS Take 1 tablet (81 mg total) by mouth 2 (two) times daily. 10 each 3  . celecoxib (CELEBREX) 200 MG capsule 1 capsule.    Marland Kitchen HYDROcodone-acetaminophen (NORCO) 10-325 MG per tablet Take 1 tablet by mouth every 8 (eight) hours as needed. 120 tablet 0  . chlorpheniramine-HYDROcodone (  TUSSIONEX) 10-8 MG/5ML LQCR Take 5 mLs by mouth every 12 (twelve) hours. 200 mL 0  . cyanocobalamin (,VITAMIN B-12,) 1000 MCG/ML injection Inject 1 mL (1,000 mcg total) into the muscle every 30 (thirty) days. 10 mL 1  . ergocalciferol (DRISDOL) 50000 units capsule Take 1 capsule (50,000 Units total) by mouth once a week. 12 capsule 0  . JUNEL FE 1/20 1-20 MG-MCG tablet TAKE 1 TABLET BY MOUTH EVERY DAY (Patient not taking: Reported on 07/17/2016) 28 tablet 6  . methocarbamol (ROBAXIN) 500 MG tablet Take 1 tablet (500 mg total) by  mouth 4 (four) times daily. 90 tablet 0   No facility-administered medications prior to visit.     Review of Systems;  Patient denies headache, fevers, malaise, unintentional weight loss, skin rash, eye pain, sinus congestion and sinus pain, sore throat, dysphagia,  hemoptysis , cough, dyspnea, wheezing, chest pain, palpitations, orthopnea, edema, abdominal pain, nausea, melena, diarrhea, constipation, flank pain, dysuria, hematuria, urinary  Frequency, nocturia, numbness, tingling, seizures,  Focal weakness, Loss of consciousness,  Tremor, insomnia, depression, anxiety, and suicidal ideation.      Objective:  BP 124/78   Pulse 68   Resp 16   Wt 154 lb (69.9 kg)   BMI 30.33 kg/m   BP Readings from Last 3 Encounters:  07/17/16 124/78  01/17/16 118/68  01/11/15 126/80    Wt Readings from Last 3 Encounters:  07/17/16 154 lb (69.9 kg)  01/17/16 154 lb 12.8 oz (70.2 kg)  01/11/15 162 lb (73.5 kg)    General appearance: alert, cooperative and appears stated age Ears: normal TM's and external ear canals both ears Throat: lips, mucosa, and tongue normal; teeth and gums normal Neck: no adenopathy, no carotid bruit, supple, symmetrical, trachea midline and thyroid not enlarged, symmetric, no tenderness/mass/nodules Back: symmetric, no curvature. ROM normal. No CVA tenderness. Lungs: clear to auscultation bilaterally Heart: regular rate and rhythm, S1, S2 normal, no murmur, click, rub or gallop Abdomen: soft, non-tender; bowel sounds normal; no masses,  no organomegaly Pulses: 2+ and symmetric Skin: Skin color, texture, turgor normal. No rashes or lesions Lymph nodes: Cervical, supraclavicular, and axillary nodes normal.  No results found for: HGBA1C  Lab Results  Component Value Date   CREATININE 0.81 01/17/2016   CREATININE 0.80 01/11/2015   CREATININE 0.8 03/03/2014    Lab Results  Component Value Date   WBC 9.1 01/17/2016   HGB 12.5 01/17/2016   HCT 37.8 01/17/2016    PLT 360.0 01/17/2016   GLUCOSE 88 01/17/2016   CHOL 175 01/17/2016   TRIG 127.0 01/17/2016   HDL 42.00 01/17/2016   LDLCALC 107 (H) 01/17/2016   ALT 8 01/17/2016   AST 14 01/17/2016   NA 138 01/17/2016   K 3.9 01/17/2016   CL 101 01/17/2016   CREATININE 0.81 01/17/2016   BUN 9 01/17/2016   CO2 29 01/17/2016   TSH 1.79 01/17/2016    No results found.  Assessment & Plan:   Problem List Items Addressed This Visit    Hypertension    Well controlled on current regimen. Renal function  Has been stable, no changes today.  Lab Results  Component Value Date   CREATININE 0.81 01/17/2016   Lab Results  Component Value Date   NA 138 01/17/2016   K 3.9 01/17/2016   CL 101 01/17/2016   CO2 29 01/17/2016         Overweight (BMI 25.0-29.9)    I have addressed  BMI and recommended a low  glycemic index diet utilizing smaller more frequent meals to increase metabolism.  I have also recommended that patient start exercising with a goal of 30 minutes of aerobic exercise a minimum of 5 days per week. Screening for lipid disorders, thyroid and diabetes has been done this year       Contraception management - Primary    UPT was negative,  Resume OCPs.       Relevant Orders   POCT urine pregnancy (Completed)   Cervical spondylosis without myelopathy    S/p surgical decompression Dec 2016 .  Right arm pain /numbness has recurred but neurosurgical follow up normal. Per patient Resume celebrex .       Relevant Medications   celecoxib (CELEBREX) 200 MG capsule   HYDROcodone-acetaminophen (NORCO) 10-325 MG tablet   Pain in mouth    Secondary to root canal nearly 4 weeks aog,  Lidocaine viscous solution prescribed.       Insomnia due to stress    Trial of trazodone.  ADD and SSRI prescribed by psychiatrist      GERD (gastroesophageal reflux disease)    Discussed current controversy regarding prolonged use of PPI in patients without documented Barretts esophagus.  Patient has no  prior EGD but has been on PPI therapy for > 5 years (per patient).  Suggested trial of pepcid 20 mg daily.  If GERD symptoms return,  advised her to accept referral for EGD.        Other Visit Diagnoses    Vitamin B12 deficiency       Relevant Medications   cyanocobalamin ((VITAMIN B-12)) injection 1,000 mcg (Completed)      I have discontinued Ms. Cobbs's chlorpheniramine-HYDROcodone, methocarbamol, and ergocalciferol. I have changed her JUNEL FE 1/20 to norethindrone-ethinyl estradiol. I have also changed her celecoxib and HYDROcodone-acetaminophen. Additionally, I am having her start on traZODone, lidocaine, and mometasone. Lastly, I am having her maintain her URELLE, Syringe (Disposable), amphetamine-dextroamphetamine, escitalopram, amphetamine-dextroamphetamine, SE-TAN PLUS, losartan, cyanocobalamin, fluticasone, and esomeprazole. We administered cyanocobalamin.  Meds ordered this encounter  Medications  . traZODone (DESYREL) 50 MG tablet    Sig: Take 0.5-1 tablets (25-50 mg total) by mouth at bedtime as needed for sleep.    Dispense:  30 tablet    Refill:  3  . celecoxib (CELEBREX) 200 MG capsule    Sig: Take 1 capsule (200 mg total) by mouth 2 (two) times daily.    Dispense:  180 capsule    Refill:  1  . norethindrone-ethinyl estradiol (JUNEL FE 1/20) 1-20 MG-MCG tablet    Sig: Take 1 tablet by mouth daily.    Dispense:  90 tablet    Refill:  2    90 DAY SUPPLY  . lidocaine (XYLOCAINE) 2 % solution    Sig: Use as directed 20 mLs in the mouth or throat as needed for mouth pain.    Dispense:  100 mL    Refill:  0  . HYDROcodone-acetaminophen (NORCO) 10-325 MG tablet    Sig: Take 1 tablet by mouth every 8 (eight) hours as needed.    Dispense:  15 tablet    Refill:  0  . mometasone (NASONEX) 50 MCG/ACT nasal spray    Sig: Place 2 sprays into the nose daily.    Dispense:  17 g    Refill:  12  . cyanocobalamin ((VITAMIN B-12)) injection 1,000 mcg   A total of 40 minutes  was spent with patient more than half of which was spent in counseling patient  on the above mentioned issues , reviewing and explaining recent labs and imaging studies done, and coordination of care. Medications Discontinued During This Encounter  Medication Reason  . chlorpheniramine-HYDROcodone (TUSSIONEX) 10-8 MG/5ML LQCR Completed Course  . methocarbamol (ROBAXIN) 500 MG tablet Completed Course  . cyanocobalamin (,VITAMIN B-12,) 1000 MCG/ML injection Duplicate  . ergocalciferol (DRISDOL) 50000 units capsule Completed Course  . celecoxib (CELEBREX) 200 MG capsule Reorder  . JUNEL FE 1/20 1-20 MG-MCG tablet Reorder  . HYDROcodone-acetaminophen (NORCO) 10-325 MG per tablet Reorder    Follow-up: No Follow-up on file.   Sherlene ShamsULLO, Florella Mcneese L, MD

## 2016-07-17 NOTE — Progress Notes (Signed)
Pre visit review using our clinic review tool, if applicable. No additional management support is needed unless otherwise documented below in the visit note. 

## 2016-07-17 NOTE — Patient Instructions (Addendum)
I HAVE REFILLED THE BIRTH CONTROL AND THE CELEBREX  START TAKING VITAMIN D3 1000 UNITS DAILY,  INCREASE TO 2000 DURING THE WINTER MONTHS   I HAVE ALSO SENT AN RX FOR A LIDOCAINE SOLUTION TO SWHICH IN YOUR MOUTH FOR A FEW MINTUES TO HELP YOUR TOOTH PAIN   NEW RX FOR TRAZODONE TO USE AS NEEDED FOR INSMONIA    We discussed my concern about continuing your PPI (NEXIUM) in light of the recently published studies suggesting an association with increased risk of dementia and kidney failure.  I advised  you to try using  (Pepcid)  famotidine 20 mg  twice daily, which is available OTC .    if your reflux symptoms are controlled,  You can Continue the daily h2 blocker. If not,   resume NEXIUMI

## 2016-07-18 ENCOUNTER — Encounter: Payer: Self-pay | Admitting: Internal Medicine

## 2016-07-18 DIAGNOSIS — K1379 Other lesions of oral mucosa: Secondary | ICD-10-CM | POA: Insufficient documentation

## 2016-07-18 DIAGNOSIS — F5102 Adjustment insomnia: Secondary | ICD-10-CM | POA: Insufficient documentation

## 2016-07-18 DIAGNOSIS — K219 Gastro-esophageal reflux disease without esophagitis: Secondary | ICD-10-CM | POA: Insufficient documentation

## 2016-07-18 NOTE — Assessment & Plan Note (Signed)
I have addressed  BMI and recommended a low glycemic index diet utilizing smaller more frequent meals to increase metabolism.  I have also recommended that patient start exercising with a goal of 30 minutes of aerobic exercise a minimum of 5 days per week. Screening for lipid disorders, thyroid and diabetes has been done this year

## 2016-07-18 NOTE — Assessment & Plan Note (Signed)
UPT was negative,  Resume OCPs.

## 2016-07-18 NOTE — Assessment & Plan Note (Signed)
Discussed current controversy regarding prolonged use of PPI in patients without documented Barretts esophagus.  Patient has no prior EGD but has been on PPI therapy for > 5 years (per patient).  Suggested trial of pepcid 20 mg daily.  If GERD symptoms return,  advised her to accept referral for EGD.  

## 2016-07-18 NOTE — Assessment & Plan Note (Signed)
Trial of trazodone.  ADD and SSRI prescribed by psychiatrist

## 2016-07-18 NOTE — Assessment & Plan Note (Signed)
Well controlled on current regimen. Renal function  Has been stable, no changes today.  Lab Results  Component Value Date   CREATININE 0.81 01/17/2016   Lab Results  Component Value Date   NA 138 01/17/2016   K 3.9 01/17/2016   CL 101 01/17/2016   CO2 29 01/17/2016

## 2016-07-18 NOTE — Assessment & Plan Note (Signed)
Secondary to root canal nearly 4 weeks aog,  Lidocaine viscous solution prescribed.

## 2016-07-18 NOTE — Assessment & Plan Note (Signed)
S/p surgical decompression Dec 2016 .  Right arm pain /numbness has recurred but neurosurgical follow up normal. Per patient Resume celebrex .

## 2016-09-06 ENCOUNTER — Other Ambulatory Visit: Payer: Self-pay

## 2016-09-28 ENCOUNTER — Other Ambulatory Visit: Payer: Self-pay | Admitting: Internal Medicine

## 2016-11-16 ENCOUNTER — Other Ambulatory Visit: Payer: Self-pay | Admitting: Internal Medicine

## 2017-01-08 ENCOUNTER — Encounter: Payer: Self-pay | Admitting: Family

## 2017-01-08 ENCOUNTER — Ambulatory Visit: Payer: BC Managed Care – PPO | Admitting: Family

## 2017-01-08 ENCOUNTER — Ambulatory Visit (INDEPENDENT_AMBULATORY_CARE_PROVIDER_SITE_OTHER): Payer: BC Managed Care – PPO | Admitting: Family

## 2017-01-08 VITALS — BP 140/80 | HR 98 | Temp 98.7°F | Ht 60.0 in | Wt 151.6 lb

## 2017-01-08 DIAGNOSIS — F411 Generalized anxiety disorder: Secondary | ICD-10-CM

## 2017-01-08 NOTE — Patient Instructions (Signed)
Thinking of you.  Work note provided.   Let me know if you need anything else

## 2017-01-08 NOTE — Progress Notes (Signed)
Subjective:    Patient ID: Destiny Martin, female    DOB: [birth year], 56 y.o.   MRN: [id number]  CC: [name] is a 56 y.o. female who presents today for follow up.   HPI: Anxiety from father passing one month ago.  She would like a work note so that she can be excused while she handles his affairs and grieves; work has not been very compassionate.   Dr. Evelene Croon from psychiatrist wrote for xanax this month.    On lexapro;  takes a half of trazodone for sleep. Trouble sleeping right now with all she has going on.  Plans to see a counselor through hospice.   No thoughts of hurting herself or anyone else.        HISTORY:  Past Medical History:  Diagnosis Date  . ADD (attention deficit disorder)   . Hypertension    History reviewed. No pertinent surgical history. Family History  Problem Relation Age of Onset  . Hypertension Father   . Heart disease Father     Allergies: Erythromycin; Lisinopril; Penicillins; and Sulfa antibiotics Current Outpatient Prescriptions on File Prior to Visit  Medication Sig Dispense Refill  . amphetamine-dextroamphetamine (ADDERALL XR) 30 MG 24 hr capsule Take 1 capsule by mouth daily.    Marland Kitchen amphetamine-dextroamphetamine (ADDERALL) 30 MG tablet Take 30 mg by mouth daily.    . celecoxib (CELEBREX) 200 MG capsule Take 1 capsule (200 mg total) by mouth 2 (two) times daily. 180 capsule 1  . cyanocobalamin (,VITAMIN B-12,) 1000 MCG/ML injection INJECT 1 ML (1,000 MCG TOTAL) INTO THE MUSCLE EVERY 30 (THIRTY) DAYS. 10 mL 0  . escitalopram (LEXAPRO) 20 MG tablet Take 1 tablet by mouth daily.    Marland Kitchen esomeprazole (NEXIUM) 40 MG capsule TAKE 1 CAPSULE (40 MG TOTAL) BY MOUTH 2 (TWO) TIMES DAILY BEFORE A MEAL. 60 capsule 3  . FeFum-FePo-FA-B Cmp-C-Zn-Mn-Cu (SE-TAN PLUS) 162-115.2-1 MG CAPS TAKE 1 CAPSULE BY MOUTH DAILY AFTER SUPPER. 30 capsule 5  . fluticasone (FLONASE) 50 MCG/ACT nasal spray INHALE 1 SPRAY BY INTRANASAL ROUTE EVERY DAY IN EACH NOSTRIL 16 g 0    . HYDROcodone-acetaminophen (NORCO) 10-325 MG tablet Take 1 tablet by mouth every 8 (eight) hours as needed. 15 tablet 0  . lidocaine (XYLOCAINE) 2 % solution Use as directed 20 mLs in the mouth or throat as needed for mouth pain. 100 mL 0  . losartan (COZAAR) 50 MG tablet TAKE 1 TABLET (50 MG TOTAL) BY MOUTH DAILY. 90 tablet 1  . mometasone (NASONEX) 50 MCG/ACT nasal spray Place 2 sprays into the nose daily. 17 g 12  . norethindrone-ethinyl estradiol (JUNEL FE 1/20) 1-20 MG-MCG tablet Take 1 tablet by mouth daily. 90 tablet 2  . Syringe, Disposable, 1 ML MISC Use monthly for B12 injections 25 each 2  . traZODone (DESYREL) 50 MG tablet Take 0.5-1 tablets (25-50 mg total) by mouth at bedtime as needed for sleep. 30 tablet 3  . URELLE (URELLE/URISED) 81 MG TABS Take 1 tablet (81 mg total) by mouth 2 (two) times daily. 10 each 3   No current facility-administered medications on file prior to visit.     Social History  Substance Use Topics  . Smoking status: Never Smoker  . Smokeless tobacco: Never Used  . Alcohol use Yes     Comment: moderate    Review of Systems  Constitutional: Negative for chills and fever.  Respiratory: Negative for cough.   Cardiovascular: Negative for chest pain and palpitations.  Gastrointestinal: Negative for nausea and vomiting.  Psychiatric/Behavioral: Positive for sleep disturbance. Negative for suicidal ideas. The patient is nervous/anxious.       Objective:    BP 140/80   Pulse 98   Temp 98.7 F (37.1 C) (Oral)   Ht 5' (1.524 m)   Wt 151 lb 9.6 oz (68.8 kg)   SpO2 99%   BMI 29.61 kg/m  BP Readings from Last 3 Encounters:  01/08/17 140/80  07/17/16 124/78  01/17/16 118/68   Wt Readings from Last 3 Encounters:  01/08/17 151 lb 9.6 oz (68.8 kg)  07/17/16 154 lb (69.9 kg)  01/17/16 154 lb 12.8 oz (70.2 kg)    Physical Exam  Constitutional: She appears well-developed and well-nourished.  Eyes: Conjunctivae are normal.  Cardiovascular:  Normal rate, regular rhythm, normal heart sounds and normal pulses.   Pulmonary/Chest: Effort normal and breath sounds normal. She has no wheezes. She has no rhonchi. She has no rales.  Neurological: She is alert.  Skin: Skin is warm and dry.  Psychiatric: She has a normal mood and affect. Her speech is normal and behavior is normal. Thought content normal.  Vitals reviewed.      Assessment & Plan:   Problem List Items Addressed This Visit      Other   Generalized anxiety disorder - Primary    Loss of father unexpectedly. Here primarily for work note which was provided. Continue current regimen. Encouraged counseling as she said she would do at hospice.           I am having Ms. Penner maintain her URELLE, Syringe (Disposable), amphetamine-dextroamphetamine, escitalopram, amphetamine-dextroamphetamine, SE-TAN PLUS, cyanocobalamin, fluticasone, traZODone, celecoxib, norethindrone-ethinyl estradiol, lidocaine, HYDROcodone-acetaminophen, mometasone, losartan, and esomeprazole.   No orders of the defined types were placed in this encounter.   Return precautions given.   Risks, benefits, and alternatives of the medications and treatment plan prescribed today were discussed, and patient expressed understanding.   Education regarding symptom management and diagnosis given to patient on AVS.  Continue to follow with TULLO, Mar DaringERESA L, MD for routine health maintenance.   Destiny CarbonAllyson S Wynns and I agreed with plan.   Rennie PlowmanMargaret Arnett, FNP

## 2017-01-08 NOTE — Assessment & Plan Note (Signed)
Loss of father unexpectedly. Here primarily for work note which was provided. Continue current regimen. Encouraged counseling as she said she would do at hospice.

## 2017-01-19 ENCOUNTER — Telehealth: Payer: Self-pay | Admitting: Internal Medicine

## 2017-01-19 NOTE — Telephone Encounter (Signed)
Pt dropped off paper work to be filled out. She stated that she saw Arnett and Jason Cooprnett said she would fill out for her. Paper work is up front in [employer] folder.

## 2017-01-19 NOTE — Telephone Encounter (Signed)
Paperwork has been placed on providers desk . 

## 2017-01-22 ENCOUNTER — Telehealth: Payer: Self-pay | Admitting: Family

## 2017-01-22 ENCOUNTER — Encounter: Payer: Self-pay | Admitting: Family

## 2017-01-22 NOTE — Telephone Encounter (Signed)
close

## 2017-02-03 ENCOUNTER — Other Ambulatory Visit: Payer: Self-pay | Admitting: Internal Medicine

## 2017-02-05 ENCOUNTER — Telehealth: Payer: Self-pay | Admitting: Family

## 2017-02-05 NOTE — Telephone Encounter (Signed)
Mail- unread mychart message   Ms. Aundria RudRogers,   [facility].   I wanted to let you know I have completed the forms for work so you may return to work.   I have left them at the front desk and you may pick them up at anytime.   Take care,   Claris CheMargaret

## 2017-02-06 NOTE — Telephone Encounter (Signed)
LMTCB

## 2017-02-07 NOTE — Telephone Encounter (Signed)
LMTCB

## 2017-02-08 NOTE — Telephone Encounter (Signed)
Spoke with pt and she stated that she has already picked up the paperwork a couple of weeks ago.

## 2017-02-28 ENCOUNTER — Encounter: Payer: Self-pay | Admitting: Internal Medicine

## 2017-02-28 ENCOUNTER — Ambulatory Visit (INDEPENDENT_AMBULATORY_CARE_PROVIDER_SITE_OTHER): Payer: BC Managed Care – PPO | Admitting: Internal Medicine

## 2017-02-28 ENCOUNTER — Encounter: Payer: Self-pay | Admitting: General Surgery

## 2017-02-28 VITALS — BP 110/76 | HR 86 | Temp 98.4°F | Resp 16 | Ht 60.0 in | Wt 149.2 lb

## 2017-02-28 DIAGNOSIS — E7800 Pure hypercholesterolemia, unspecified: Secondary | ICD-10-CM

## 2017-02-28 DIAGNOSIS — M47812 Spondylosis without myelopathy or radiculopathy, cervical region: Secondary | ICD-10-CM | POA: Diagnosis not present

## 2017-02-28 DIAGNOSIS — D510 Vitamin B12 deficiency anemia due to intrinsic factor deficiency: Secondary | ICD-10-CM | POA: Diagnosis not present

## 2017-02-28 DIAGNOSIS — E559 Vitamin D deficiency, unspecified: Secondary | ICD-10-CM | POA: Diagnosis not present

## 2017-02-28 DIAGNOSIS — Z0000 Encounter for general adult medical examination without abnormal findings: Secondary | ICD-10-CM | POA: Diagnosis not present

## 2017-02-28 DIAGNOSIS — Z1211 Encounter for screening for malignant neoplasm of colon: Secondary | ICD-10-CM

## 2017-02-28 DIAGNOSIS — Z1231 Encounter for screening mammogram for malignant neoplasm of breast: Secondary | ICD-10-CM | POA: Diagnosis not present

## 2017-02-28 DIAGNOSIS — Z634 Disappearance and death of family member: Secondary | ICD-10-CM

## 2017-02-28 DIAGNOSIS — E538 Deficiency of other specified B group vitamins: Secondary | ICD-10-CM | POA: Diagnosis not present

## 2017-02-28 DIAGNOSIS — G4733 Obstructive sleep apnea (adult) (pediatric): Secondary | ICD-10-CM

## 2017-02-28 DIAGNOSIS — F4329 Adjustment disorder with other symptoms: Secondary | ICD-10-CM

## 2017-02-28 DIAGNOSIS — Z1239 Encounter for other screening for malignant neoplasm of breast: Secondary | ICD-10-CM

## 2017-02-28 DIAGNOSIS — R5383 Other fatigue: Secondary | ICD-10-CM | POA: Diagnosis not present

## 2017-02-28 DIAGNOSIS — F4321 Adjustment disorder with depressed mood: Secondary | ICD-10-CM

## 2017-02-28 LAB — CBC WITH DIFFERENTIAL/PLATELET
BASOS PCT: 0.8 % (ref 0.0–3.0)
Basophils Absolute: 0.1 10*3/uL (ref 0.0–0.1)
EOS PCT: 5.8 % — AB (ref 0.0–5.0)
Eosinophils Absolute: 0.5 10*3/uL (ref 0.0–0.7)
HEMATOCRIT: 39.1 % (ref 36.0–46.0)
HEMOGLOBIN: 13.1 g/dL (ref 12.0–15.0)
Lymphocytes Relative: 23.3 % (ref 12.0–46.0)
Lymphs Abs: 1.8 10*3/uL (ref 0.7–4.0)
MCHC: 33.6 g/dL (ref 30.0–36.0)
MCV: 91.9 fl (ref 78.0–100.0)
MONOS PCT: 6.2 % (ref 3.0–12.0)
Monocytes Absolute: 0.5 10*3/uL (ref 0.1–1.0)
Neutro Abs: 5 10*3/uL (ref 1.4–7.7)
Neutrophils Relative %: 63.9 % (ref 43.0–77.0)
Platelets: 406 10*3/uL — ABNORMAL HIGH (ref 150.0–400.0)
RBC: 4.26 Mil/uL (ref 3.87–5.11)
RDW: 13.2 % (ref 11.5–15.5)
WBC: 7.8 10*3/uL (ref 4.0–10.5)

## 2017-02-28 LAB — LIPID PANEL
CHOLESTEROL: 174 mg/dL (ref 0–200)
HDL: 44.2 mg/dL (ref >39.00)
LDL CALC: 95 mg/dL (ref 0–99)
NonHDL: 129.88
TRIGLYCERIDES: 176 mg/dL — AB (ref 0.0–149.0)
Total CHOL/HDL Ratio: 4
VLDL: 35.2 mg/dL (ref 0.0–40.0)

## 2017-02-28 LAB — COMPREHENSIVE METABOLIC PANEL
ALBUMIN: 4.3 g/dL (ref 3.5–5.2)
ALT: 7 U/L (ref 0–35)
AST: 11 U/L (ref 0–37)
Alkaline Phosphatase: 60 U/L (ref 39–117)
BUN: 10 mg/dL (ref 6–23)
CO2: 30 mEq/L (ref 19–32)
CREATININE: 0.83 mg/dL (ref 0.40–1.20)
Calcium: 9.3 mg/dL (ref 8.4–10.5)
Chloride: 103 mEq/L (ref 96–112)
GFR: 76.09 mL/min (ref >60.00)
Glucose, Bld: 99 mg/dL (ref 70–99)
POTASSIUM: 4.3 meq/L (ref 3.5–5.1)
SODIUM: 139 meq/L (ref 135–145)
TOTAL PROTEIN: 6.6 g/dL (ref 6.0–8.3)
Total Bilirubin: 0.3 mg/dL (ref 0.2–1.2)

## 2017-02-28 LAB — VITAMIN D 25 HYDROXY (VIT D DEFICIENCY, FRACTURES): VITD: 23.85 ng/mL — ABNORMAL LOW (ref 30.00–100.00)

## 2017-02-28 LAB — TSH: TSH: 2.28 u[IU]/mL (ref 0.35–4.50)

## 2017-02-28 MED ORDER — CYANOCOBALAMIN 1000 MCG/ML IJ SOLN
1000.0000 ug | Freq: Once | INTRAMUSCULAR | Status: AC
  Administered 2017-02-28: 1000 ug via INTRAMUSCULAR

## 2017-02-28 NOTE — Progress Notes (Signed)
Patient ID: Destiny Martin, female    DOB: Feb 23, 1961  Age: 56 y.o. MRN: [id number]  The patient is here for annual Medicare wellness examination and management of other chronic and acute problems.   PAP 2017 NORMAL  COLONOSCOPY VS COLOGAUARD DUE MAMMOGRAM NORMAL  2016 DOWN 5 LBS SINCE 2017,  DOWN 13 LBS SINCE 2016 The risk factors are reflected in the social history.  The roster of all physicians providing medical care to patient - is listed in the Snapshot section of the chart.  Activities of daily living:  The patient is 100% independent in all ADLs: dressing, toileting, feeding as well as independent mobility  Home safety : The patient has smoke detectors in the home. They wear seatbelts.  There are no firearms at home. There is no violence in the home.   There is no risks for hepatitis, STDs or HIV. There is no   history of blood transfusion. They have no travel history to infectious disease endemic areas of the world.  The patient has seen their dentist in the last six month. They have seen their eye doctor in the last year. They admit to slight hearing difficulty with regard to whispered voices and some television programs.  They have deferred audiologic testing in the last year.  They do not  have excessive sun exposure. Discussed the need for sun protection: hats, long sleeves and use of sunscreen if there is significant sun exposure.   Diet: the importance of a healthy diet is discussed. They do have a healthy diet.  The benefits of regular aerobic exercise were discussed. She walks 4 times per week ,  20 minutes.   Depression screen: there are no signs or vegative symptoms of depression- irritability, change in appetite, anhedonia, sadness/tearfullness.  Cognitive assessment: the patient manages all their financial and personal affairs and is actively engaged. They could relate day,date,year and events; recalled 2/3 objects at 3 minutes; performed clock-face test normally.  The  following portions of the patient's history were reviewed and updated as appropriate: allergies, current medications, past family history, past medical history,  past surgical history, past social history  and problem list.  Visual acuity was not assessed per patient preference since she has regular follow up with her ophthalmologist. Hearing and body mass index were assessed and reviewed.   During the course of the visit the patient was educated and counseled about appropriate screening and preventive services including : fall prevention , diabetes screening, nutrition counseling, colorectal cancer screening, and recommended immunizations.    CC: The primary encounter diagnosis was Vitamin B12 deficiency. Diagnoses of Breast cancer screening, Vitamin D deficiency, Other fatigue, Pure hypercholesterolemia, Colon cancer screening, Complicated grief, Encounter for preventive health examination, Pernicious anemia, Cervical spondylosis without myelopathy, and Obstructive sleep apnea were also pertinent to this visit.   GRIEF:  FATHER DIED on 01/21/24in Hospice after having a stroke.  Her had been recently hospitalized with severe sepsis and during his convalescence at a rehab facility, she witnessed a stroke in evolution with facial droop and loss of arm function.  She remains very angry at the way [facility] withheld TPA and then transferred him to [facility], where she was told that it was "too late" to do anything for him.  She made the decision to start Hospice [facility] when he was unable to recover swallow function,  And remains guilt ridden over "starving him to death," although she was following his advanced directives. She continues to endorse anger,  Bitterness,  And insomnia.  She has returned to work but having a difficult tie being productive and civil to her colleagues.   Has been using LEXAPRO, TRAZODONE and  XANAX  History Destiny Martin has a past medical history of ADD (attention deficit disorder) and  Hypertension.   She has no past surgical history on file.   Her family history includes Heart disease in her father; Hypertension in her father.She reports that she has never smoked. She has never used smokeless tobacco. She reports that she drinks alcohol. She reports that she does not use drugs.  Outpatient Medications Prior to Visit  Medication Sig Dispense Refill  . amphetamine-dextroamphetamine (ADDERALL) 30 MG tablet Take 30 mg by mouth daily.    . celecoxib (CELEBREX) 200 MG capsule Take 1 capsule (200 mg total) by mouth 2 (two) times daily. 180 capsule 1  . escitalopram (LEXAPRO) 20 MG tablet Take 1 tablet by mouth daily.    Marland Kitchen. esomeprazole (NEXIUM) 40 MG capsule TAKE 1 CAPSULE (40 MG TOTAL) BY MOUTH 2 (TWO) TIMES DAILY BEFORE A MEAL. 60 capsule 3  . fluticasone (FLONASE) 50 MCG/ACT nasal spray INHALE 1 SPRAY BY INTRANASAL ROUTE EVERY DAY IN EACH NOSTRIL 16 g 0  . HYDROcodone-acetaminophen (NORCO) 10-325 MG tablet Take 1 tablet by mouth every 8 (eight) hours as needed. 15 tablet 0  . losartan (COZAAR) 50 MG tablet TAKE 1 TABLET (50 MG TOTAL) BY MOUTH DAILY. 90 tablet 1  . mometasone (NASONEX) 50 MCG/ACT nasal spray Place 2 sprays into the nose daily. 17 g 12  . norethindrone-ethinyl estradiol (JUNEL FE 1/20) 1-20 MG-MCG tablet Take 1 tablet by mouth daily. 90 tablet 2  . traZODone (DESYREL) 50 MG tablet TAKE 0.5-1 TABLETS (25-50 MG TOTAL) BY MOUTH AT BEDTIME AS NEEDED FOR SLEEP. 30 tablet 3  . URELLE (URELLE/URISED) 81 MG TABS Take 1 tablet (81 mg total) by mouth 2 (two) times daily. 10 each 3  . amphetamine-dextroamphetamine (ADDERALL XR) 30 MG 24 hr capsule Take 1 capsule by mouth daily.    Marland Kitchen. FeFum-FePo-FA-B Cmp-C-Zn-Mn-Cu (SE-TAN PLUS) 162-115.2-1 MG CAPS TAKE 1 CAPSULE BY MOUTH DAILY AFTER SUPPER. (Patient not taking: Reported on 02/28/2017) 30 capsule 5  . cyanocobalamin (,VITAMIN B-12,) 1000 MCG/ML injection INJECT 1 ML (1,000 MCG TOTAL) INTO THE MUSCLE EVERY 30 (THIRTY) DAYS. 10 mL  0  . lidocaine (XYLOCAINE) 2 % solution Use as directed 20 mLs in the mouth or throat as needed for mouth pain. (Patient not taking: Reported on 02/28/2017) 100 mL 0  . Syringe, Disposable, 1 ML MISC Use monthly for B12 injections 25 each 2   No facility-administered medications prior to visit.     Review of Systems   Patient denies headache, fevers, malaise, unintentional weight loss, skin rash, eye pain, sinus congestion and sinus pain, sore throat, dysphagia,  hemoptysis , cough, dyspnea, wheezing, chest pain, palpitations, orthopnea, edema, abdominal pain, nausea, melena, diarrhea, constipation, flank pain, dysuria, hematuria, urinary  Frequency, nocturia, numbness, tingling, seizures,  Focal weakness, Loss of consciousness,  Tremor, insomnia, depression, anxiety, and suicidal ideation.      Objective:  BP 110/76   Pulse 86   Temp 98.4 F (36.9 C) (Oral)   Resp 16   Ht 5' (1.524 m)   Wt 149 lb 3.2 oz (67.7 kg)   SpO2 98%   BMI 29.14 kg/m   Physical Exam   General appearance: alert, cooperative and appears stated age Head: Normocephalic, without obvious abnormality, atraumatic Eyes: conjunctivae/corneas clear. PERRL, EOM's  intact. Fundi benign. Ears: normal TM's and external ear canals both ears Nose: Nares normal. Septum midline. Mucosa normal. No drainage or sinus tenderness. Throat: lips, mucosa, and tongue normal; teeth and gums normal Neck: no adenopathy, no carotid bruit, no JVD, supple, symmetrical, trachea midline and thyroid not enlarged, symmetric, no tenderness/mass/nodules Lungs: clear to auscultation bilaterally Breasts: normal appearance, no masses or tenderness Heart: regular rate and rhythm, S1, S2 normal, no murmur, click, rub or gallop Abdomen: soft, non-tender; bowel sounds normal; no masses,  no organomegaly Extremities: extremities normal, atraumatic, no cyanosis or edema Pulses: 2+ and symmetric Skin: Skin color, texture, turgor normal. No rashes or  lesions Neurologic: Alert and oriented X 3, normal strength and tone. Normal symmetric reflexes. Normal coordination and gait.      Assessment & Plan:   Problem List Items Addressed This Visit    Cervical spondylosis without myelopathy   Complicated grief    She remains very angry and bitter following the death of her father at age 81. She appears to be considering making a formal complaint against the ER at [facility] for withholding TPA and failing to make a timely transfer to a facility that would treat his code stroke.  Recommending grief counselling, continued use of lexapro, trazodone and prn alprazolam .      Encounter for preventive health examination    Annual comprehensive preventive exam was done as well as an evaluation and management of chronic conditions .  During the course of the visit the patient was educated and counseled about appropriate screening and preventive services including :  diabetes screening, lipid analysis with projected  10 year  risk for CAD , nutrition counseling, breast, cervical and colorectal cancer screening, and recommended immunizations.  Printed recommendations for health maintenance screenings was given.  Mammogram and colonoscopy referrals in progress      Obstructive sleep apnea    She was prescribed CPAP at 4 cm H20 after confirmatory study IN May 2017.  Unclear if she is using it.       Pernicious anemia    Managed with b12 injections,  Given today.      Relevant Medications   cyanocobalamin ((VITAMIN B-12)) injection 1,000 mcg (Completed)    Other Visit Diagnoses    Vitamin B12 deficiency    -  Primary   Relevant Medications   cyanocobalamin ((VITAMIN B-12)) injection 1,000 mcg (Completed)   Breast cancer screening       Relevant Orders   MM SCREENING BREAST TOMO BILATERAL   Vitamin D deficiency       Relevant Orders   VITAMIN D 25 Hydroxy (Vit-D Deficiency, Fractures) (Completed)   Other fatigue       Relevant Orders   Comprehensive  metabolic panel (Completed)   TSH (Completed)   CBC with Differential/Platelet (Completed)   Pure hypercholesterolemia       Relevant Orders   Lipid panel (Completed)   Colon cancer screening       Relevant Orders   Ambulatory referral to General Surgery      I have discontinued Ms. Mulhearn's Syringe (Disposable), cyanocobalamin, and lidocaine. I am also having her start on ergocalciferol. Additionally, I am having her maintain her URELLE, amphetamine-dextroamphetamine, escitalopram, amphetamine-dextroamphetamine, SE-TAN PLUS, fluticasone, celecoxib, norethindrone-ethinyl estradiol, HYDROcodone-acetaminophen, mometasone, losartan, esomeprazole, and traZODone. We administered cyanocobalamin.  Meds ordered this encounter  Medications  . cyanocobalamin ((VITAMIN B-12)) injection 1,000 mcg  . ergocalciferol (DRISDOL) 50000 units capsule    Sig: Take 1 capsule (50,000 Units  total) by mouth once a week.    Dispense:  4 capsule    Refill:  1    Medications Discontinued During This Encounter  Medication Reason  . cyanocobalamin (,VITAMIN B-12,) 1000 MCG/ML injection Patient has not taken in last 30 days  . lidocaine (XYLOCAINE) 2 % solution Patient has not taken in last 30 days  . Syringe, Disposable, 1 ML MISC Patient has not taken in last 30 days    Follow-up: No Follow-up on file.   Sherlene Shams, MD

## 2017-02-28 NOTE — Patient Instructions (Signed)
Good to see you..   You have been referred to Dr byrnett for colonoscopy  Fasting labs done today  Mammogram has been ordered   Health Maintenance for Postmenopausal Women Menopause is a normal process in which your reproductive ability comes to an end. This process happens gradually over a span of months to years, usually between the ages of 68 and 58. Menopause is complete when you have missed 12 consecutive menstrual periods. It is important to talk with your health care provider about some of the most common conditions that affect postmenopausal women, such as heart disease, cancer, and bone loss (osteoporosis). Adopting a healthy lifestyle and getting preventive care can help to promote your health and wellness. Those actions can also lower your chances of developing some of these common conditions. What should I know about menopause? During menopause, you may experience a number of symptoms, such as:  Moderate-to-severe hot flashes.  Night sweats.  Decrease in sex drive.  Mood swings.  Headaches.  Tiredness.  Irritability.  Memory problems.  Insomnia. Choosing to treat or not to treat menopausal changes is an individual decision that you make with your health care provider. What should I know about hormone replacement therapy and supplements? Hormone therapy products are effective for treating symptoms that are associated with menopause, such as hot flashes and night sweats. Hormone replacement carries certain risks, especially as you become older. If you are thinking about using estrogen or estrogen with progestin treatments, discuss the benefits and risks with your health care provider. What should I know about heart disease and stroke? Heart disease, heart attack, and stroke become more likely as you age. This may be due, in part, to the hormonal changes that your body experiences during menopause. These can affect how your body processes dietary fats, triglycerides, and  cholesterol. Heart attack and stroke are both medical emergencies. There are many things that you can do to help prevent heart disease and stroke:  Have your blood pressure checked at least every 1-2 years. High blood pressure causes heart disease and increases the risk of stroke.  If you are 64-31 years old, ask your health care provider if you should take aspirin to prevent a heart attack or a stroke.  Do not use any tobacco products, including cigarettes, chewing tobacco, or electronic cigarettes. If you need help quitting, ask your health care provider.  It is important to eat a healthy diet and maintain a healthy weight.  Be sure to include plenty of vegetables, fruits, low-fat dairy products, and lean protein.  Avoid eating foods that are high in solid fats, added sugars, or salt (sodium).  Get regular exercise. This is one of the most important things that you can do for your health.  Try to exercise for at least 150 minutes each week. The type of exercise that you do should increase your heart rate and make you sweat. This is known as moderate-intensity exercise.  Try to do strengthening exercises at least twice each week. Do these in addition to the moderate-intensity exercise.  Know your numbers.Ask your health care provider to check your cholesterol and your blood glucose. Continue to have your blood tested as directed by your health care provider. What should I know about cancer screening? There are several types of cancer. Take the following steps to reduce your risk and to catch any cancer development as early as possible. Breast Cancer  Practice breast self-awareness.  This means understanding how your breasts normally appear and feel.  It also means doing regular breast self-exams. Let your health care provider know about any changes, no matter how small.  If you are 84 or older, have a clinician do a breast exam (clinical breast exam or CBE) every year. Depending on  your age, family history, and medical history, it may be recommended that you also have a yearly breast X-ray (mammogram).  If you have a family history of breast cancer, talk with your health care provider about genetic screening.  If you are at high risk for breast cancer, talk with your health care provider about having an MRI and a mammogram every year.  Breast cancer (BRCA) gene test is recommended for women who have family members with BRCA-related cancers. Results of the assessment will determine the need for genetic counseling and BRCA1 and for BRCA2 testing. BRCA-related cancers include these types:  Breast. This occurs in males or females.  Ovarian.  Tubal. This may also be called fallopian tube cancer.  Cancer of the abdominal or pelvic lining (peritoneal cancer).  Prostate.  Pancreatic. Cervical, Uterine, and Ovarian Cancer  Your health care provider may recommend that you be screened regularly for cancer of the pelvic organs. These include your ovaries, uterus, and vagina. This screening involves a pelvic exam, which includes checking for microscopic changes to the surface of your cervix (Pap test).  For women ages 21-65, health care providers may recommend a pelvic exam and a Pap test every three years. For women ages 23-65, they may recommend the Pap test and pelvic exam, combined with testing for human papilloma virus (HPV), every five years. Some types of HPV increase your risk of cervical cancer. Testing for HPV may also be done on women of any age who have unclear Pap test results.  Other health care providers may not recommend any screening for nonpregnant women who are considered low risk for pelvic cancer and have no symptoms. Ask your health care provider if a screening pelvic exam is right for you.  If you have had past treatment for cervical cancer or a condition that could lead to cancer, you need Pap tests and screening for cancer for at least 20 years after your  treatment. If Pap tests have been discontinued for you, your risk factors (such as having a new sexual partner) need to be reassessed to determine if you should start having screenings again. Some women have medical problems that increase the chance of getting cervical cancer. In these cases, your health care provider may recommend that you have screening and Pap tests more often.  If you have a family history of uterine cancer or ovarian cancer, talk with your health care provider about genetic screening.  If you have vaginal bleeding after reaching menopause, tell your health care provider.  There are currently no reliable tests available to screen for ovarian cancer. Lung Cancer  Lung cancer screening is recommended for adults 36-12 years old who are at high risk for lung cancer because of a history of smoking. A yearly low-dose CT scan of the lungs is recommended if you:  Currently smoke.  Have a history of at least 30 pack-years of smoking and you currently smoke or have quit within the past 15 years. A pack-year is smoking an average of one pack of cigarettes per day for one year. Yearly screening should:  Continue until it has been 15 years since you quit.  Stop if you develop a health problem that would prevent you from having lung cancer treatment.  Colorectal Cancer  This type of cancer can be detected and can often be prevented.  Routine colorectal cancer screening usually begins at age 45 and continues through age 39.  If you have risk factors for colon cancer, your health care provider may recommend that you be screened at an earlier age.  If you have a family history of colorectal cancer, talk with your health care provider about genetic screening.  Your health care provider may also recommend using home test kits to check for hidden blood in your stool.  A small camera at the end of a tube can be used to examine your colon directly (sigmoidoscopy or colonoscopy). This is  done to check for the earliest forms of colorectal cancer.  Direct examination of the colon should be repeated every 5-10 years until age 81. However, if early forms of precancerous polyps or small growths are found or if you have a family history or genetic risk for colorectal cancer, you may need to be screened more often. Skin Cancer  Check your skin from head to toe regularly.  Monitor any moles. Be sure to tell your health care provider:  About any new moles or changes in moles, especially if there is a change in a mole's shape or color.  If you have a mole that is larger than the size of a pencil eraser.  If any of your family members has a history of skin cancer, especially at a young age, talk with your health care provider about genetic screening.  Always use sunscreen. Apply sunscreen liberally and repeatedly throughout the day.  Whenever you are outside, protect yourself by wearing long sleeves, pants, a wide-brimmed hat, and sunglasses. What should I know about osteoporosis? Osteoporosis is a condition in which bone destruction happens more quickly than new bone creation. After menopause, you may be at an increased risk for osteoporosis. To help prevent osteoporosis or the bone fractures that can happen because of osteoporosis, the following is recommended:  If you are 29-32 years old, get at least 1,000 mg of calcium and at least 600 mg of vitamin D per day.  If you are older than age 88 but younger than age 25, get at least 1,200 mg of calcium and at least 600 mg of vitamin D per day.  If you are older than age 61, get at least 1,200 mg of calcium and at least 800 mg of vitamin D per day. Smoking and excessive alcohol intake increase the risk of osteoporosis. Eat foods that are rich in calcium and vitamin D, and do weight-bearing exercises several times each week as directed by your health care provider. What should I know about how menopause affects my mental  health? Depression may occur at any age, but it is more common as you become older. Common symptoms of depression include:  Low or sad mood.  Changes in sleep patterns.  Changes in appetite or eating patterns.  Feeling an overall lack of motivation or enjoyment of activities that you previously enjoyed.  Frequent crying spells. Talk with your health care provider if you think that you are experiencing depression. What should I know about immunizations? It is important that you get and maintain your immunizations. These include:  Tetanus, diphtheria, and pertussis (Tdap) booster vaccine.  Influenza every year before the flu season begins.  Pneumonia vaccine.  Shingles vaccine. Your health care provider may also recommend other immunizations. This information is not intended to replace advice given to you by your health  care provider. Make sure you discuss any questions you have with your health care provider. Document Released: 01/12/2006 Document Revised: 06/09/2016 Document Reviewed: 08/24/2015 Elsevier Interactive Patient Education  2017 [employer].

## 2017-02-28 NOTE — Progress Notes (Signed)
Pre visit review using our clinic review tool, if applicable. No additional management support is needed unless otherwise documented below in the visit note. 

## 2017-03-01 ENCOUNTER — Encounter: Payer: Self-pay | Admitting: Internal Medicine

## 2017-03-01 MED ORDER — ERGOCALCIFEROL 1.25 MG (50000 UT) PO CAPS: 50000.0000 [IU] | ORAL_CAPSULE | ORAL | 1 refills | Status: DC

## 2017-03-02 DIAGNOSIS — F4321 Adjustment disorder with depressed mood: Secondary | ICD-10-CM | POA: Insufficient documentation

## 2017-03-02 DIAGNOSIS — F4329 Adjustment disorder with other symptoms: Secondary | ICD-10-CM | POA: Insufficient documentation

## 2017-03-02 DIAGNOSIS — Z634 Disappearance and death of family member: Secondary | ICD-10-CM

## 2017-03-02 HISTORY — DX: Adjustment disorder with depressed mood: F43.21

## 2017-03-02 NOTE — Assessment & Plan Note (Signed)
She remains very angry and bitter following the death of her father at age 56. She appears to be considering making a formal complaint against the ER at [facility] for withholding TPA and failing to make a timely transfer to a facility that would treat his code stroke.  Recommending grief counselling, continued use of lexapro, trazodone and prn alprazolam .

## 2017-03-02 NOTE — Assessment & Plan Note (Signed)
Annual comprehensive preventive exam was done as well as an evaluation and management of chronic conditions .  During the course of the visit the patient was educated and counseled about appropriate screening and preventive services including :  diabetes screening, lipid analysis with projected  10 year  risk for CAD , nutrition counseling, breast, cervical and colorectal cancer screening, and recommended immunizations.  Printed recommendations for health maintenance screenings was given.  Mammogram and colonoscopy referrals in progress

## 2017-03-02 NOTE — Assessment & Plan Note (Signed)
She was prescribed CPAP at 4 cm H20 after confirmatory study IN May 2017.  Unclear if she is using it.

## 2017-03-02 NOTE — Assessment & Plan Note (Signed)
Managed with b12 injections,  Given today.

## 2017-03-12 ENCOUNTER — Encounter: Payer: Self-pay | Admitting: General Surgery

## 2017-03-12 ENCOUNTER — Ambulatory Visit (INDEPENDENT_AMBULATORY_CARE_PROVIDER_SITE_OTHER): Payer: BC Managed Care – PPO | Admitting: General Surgery

## 2017-03-12 VITALS — BP 124/72 | HR 72 | Resp 12 | Ht 60.0 in | Wt 149.0 lb

## 2017-03-12 DIAGNOSIS — Z1211 Encounter for screening for malignant neoplasm of colon: Secondary | ICD-10-CM | POA: Diagnosis not present

## 2017-03-12 DIAGNOSIS — K219 Gastro-esophageal reflux disease without esophagitis: Secondary | ICD-10-CM | POA: Diagnosis not present

## 2017-03-12 MED ORDER — POLYETHYLENE GLYCOL 3350 17 GM/SCOOP PO POWD: ORAL | 0 refills | Status: DC

## 2017-03-12 NOTE — Patient Instructions (Signed)
Colonoscopy, Adult A colonoscopy is an exam to look at the entire large intestine. During the exam, a lubricated, bendable tube is inserted into the anus and then passed into the rectum, colon, and other parts of the large intestine. A colonoscopy is often done as a part of normal colorectal screening or in response to certain symptoms, such as anemia, persistent diarrhea, abdominal pain, and blood in the stool. The exam can help screen for and diagnose medical problems, including:  Tumors.  Polyps.  Inflammation.  Areas of bleeding. Tell a health care provider about:  Any allergies you have.  All medicines you are taking, including vitamins, herbs, eye drops, creams, and over-the-counter medicines.  Any problems you or family members have had with anesthetic medicines.  Any blood disorders you have.  Any surgeries you have had.  Any medical conditions you have.  Any problems you have had passing stool. What are the risks? Generally, this is a safe procedure. However, problems may occur, including:  Bleeding.  A tear in the intestine.  A reaction to medicines given during the exam.  Infection (rare). What happens before the procedure? Eating and drinking restrictions  Follow instructions from your health care provider about eating and drinking, which may include:  A few days before the procedure - follow a low-fiber diet. Avoid nuts, seeds, dried fruit, raw fruits, and vegetables.  1-3 days before the procedure - follow a clear liquid diet. Drink only clear liquids, such as clear broth or bouillon, black coffee or tea, clear juice, clear soft drinks or sports drinks, gelatin dessert, and popsicles. Avoid any liquids that contain red or purple dye.  On the day of the procedure - do not eat or drink anything during the 2 hours before the procedure, or within the time period that your health care provider recommends. Bowel prep  If you were prescribed an oral bowel prep to  clean out your colon:  Take it as told by your health care provider. Starting the day before your procedure, you will need to drink a large amount of medicated liquid. The liquid will cause you to have multiple loose stools until your stool is almost clear or light green.  If your skin or anus gets irritated from diarrhea, you may use these to relieve the irritation:  Medicated wipes, such as adult wet wipes with aloe and vitamin E.  A skin soothing-product like petroleum jelly.  If you vomit while drinking the bowel prep, take a break for up to 60 minutes and then begin the bowel prep again. If vomiting continues and you cannot take the bowel prep without vomiting, call your health care provider. General instructions   Ask your health care provider about changing or stopping your regular medicines. This is especially important if you are taking diabetes medicines or blood thinners.  Plan to have someone take you home from the hospital or clinic. What happens during the procedure?  An IV tube may be inserted into one of your veins.  You will be given medicine to help you relax (sedative).  To reduce your risk of infection:  Your health care team will wash or sanitize their hands.  Your anal area will be washed with soap.  You will be asked to lie on your side with your knees bent.  Your health care provider will lubricate a long, thin, flexible tube. The tube will have a camera and a light on the end.  The tube will be inserted into your anus.    The tube will be gently eased through your rectum and colon.  Air will be delivered into your colon to keep it open. You may feel some pressure or cramping.  The camera will be used to take images during the procedure.  A small tissue sample may be removed from your body to be examined under a microscope (biopsy). If any potential problems are found, the tissue will be sent to a lab for testing.  If small polyps are found, your health  care provider may remove them and have them checked for cancer cells.  The tube that was inserted into your anus will be slowly removed. The procedure may vary among health care providers and hospitals. What happens after the procedure?  Your blood pressure, heart rate, breathing rate, and blood oxygen level will be monitored until the medicines you were given have worn off.  Do not drive for 24 hours after the exam.  You may have a small amount of blood in your stool.  You may pass gas and have mild abdominal cramping or bloating due to the air that was used to inflate your colon during the exam.  It is up to you to get the results of your procedure. Ask your health care provider, or the department performing the procedure, when your results will be ready. This information is not intended to replace advice given to you by your health care provider. Make sure you discuss any questions you have with your health care provider. Document Released: 11/17/2000 Document Revised: 09/20/2016 Document Reviewed: 02/01/2016 Elsevier Interactive Patient Education  2017 Elsevier Inc.  

## 2017-03-12 NOTE — Progress Notes (Signed)
Patient ID: Destiny Martin, female   DOB: Jan 13, 1961, 56 y.o.   MRN: [id number]  Chief Complaint  Patient presents with  . Colonoscopy    HPI Destiny Martin is a 56 y.o. female here today for a evaluation of a screening colonoscopy. Patient states no Gi problems at this time. Moves her bowels every two days.  She has bad acid Reflux for a long time, worsening symptoms of late. Occasional mild dysphagia. No episodic vomiting. Frequent use of antacids. Ongoing anti-inflammatory therapy..   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 Marland Kitchen HPI  Past Medical History:  Diagnosis Date  . ADD (attention deficit disorder)   . Allergy   . GERD (gastroesophageal reflux disease)   . Hypertension     Past Surgical History:  Procedure Laterality Date  . FOOT SURGERY Right 2007  . NECK SURGERY  2016  . TONSILLECTOMY  1974    Family History  Problem Relation Age of Onset  . Hypertension Father   . Heart disease Father   . Prostate cancer Father     Social History Social History  Substance Use Topics  . Smoking status: Never Smoker  . Smokeless tobacco: Never Used  . Alcohol use Yes     Comment: moderate    Allergies  Allergen Reactions  . Erythromycin Nausea Only  . Lisinopril Swelling    Angio-edema  . Penicillins   . Sulfa Antibiotics Rash    Current Outpatient Prescriptions  Medication Sig Dispense Refill  . amphetamine-dextroamphetamine (ADDERALL XR) 30 MG 24 hr capsule Take 1 capsule by mouth daily.    . celecoxib (CELEBREX) 200 MG capsule Take 1 capsule (200 mg total) by mouth 2 (two) times daily. 180 capsule 1  . ergocalciferol (DRISDOL) 50000 units capsule Take 1 capsule (50,000 Units total) by mouth once a week. 4 capsule 1  . escitalopram (LEXAPRO) 20 MG tablet Take 1 tablet by mouth daily.    Marland Kitchen esomeprazole (NEXIUM) 40 MG capsule TAKE 1 CAPSULE (40 MG TOTAL) BY MOUTH 2 (TWO) TIMES DAILY BEFORE A MEAL. 60 capsule 3  . FeFum-FePo-FA-B Cmp-C-Zn-Mn-Cu (SE-TAN  PLUS) 162-115.2-1 MG CAPS TAKE 1 CAPSULE BY MOUTH DAILY AFTER SUPPER. 30 capsule 5  . fluticasone (FLONASE) 50 MCG/ACT nasal spray INHALE 1 SPRAY BY INTRANASAL ROUTE EVERY DAY IN EACH NOSTRIL 16 g 0  . HYDROcodone-acetaminophen (NORCO) 10-325 MG tablet Take 1 tablet by mouth every 8 (eight) hours as needed. 15 tablet 0  . losartan (COZAAR) 50 MG tablet TAKE 1 TABLET (50 MG TOTAL) BY MOUTH DAILY. 90 tablet 1  . mometasone (NASONEX) 50 MCG/ACT nasal spray Place 2 sprays into the nose daily. 17 g 12  . norethindrone-ethinyl estradiol (JUNEL FE 1/20) 1-20 MG-MCG tablet Take 1 tablet by mouth daily. 90 tablet 2  . traZODone (DESYREL) 50 MG tablet TAKE 0.5-1 TABLETS (25-50 MG TOTAL) BY MOUTH AT BEDTIME AS NEEDED FOR SLEEP. 30 tablet 3  . URELLE (URELLE/URISED) 81 MG TABS Take 1 tablet (81 mg total) by mouth 2 (two) times daily. 10 each 3  . polyethylene glycol powder (GLYCOLAX/MIRALAX) powder 255 grams one bottle for colonoscopy prep 255 g 0   No current facility-administered medications for this visit.     Review of Systems Review of Systems  Blood pressure 124/72, pulse 72, resp. rate 12, height 5' (1.524 m), weight 149 lb (67.6 kg), last menstrual period 11/11/2016.  Physical Exam Physical Exam  Constitutional: She is oriented to person, place, and time. She appears  well-developed and well-nourished.  Cardiovascular: Normal rate, regular rhythm and normal heart sounds.   Pulmonary/Chest: Effort normal and breath sounds normal.  Neurological: She is alert and oriented to person, place, and time.  Skin: Skin is warm and dry.    Data Reviewed CBC of 02/28/2017 showed a hemoglobin of 13.1 with an MCV of 92, platelet count of 406,000. Normal electrolytes, renal function, estimated GFR 76.  Assessment    Anesthesia for screening colonoscopy.  Long-standing reflux on excellent PPI therapy. Upper endoscopy to assess for esophageal damage and/or significant hiatal hernia.    Plan    Upper  endoscopy and colonoscopy recommended.     Colonoscopy with possible biopsy/polypectomy prn: Information regarding the procedure, including its potential risks and complications (including but not limited to perforation of the bowel, which may require emergency surgery to repair, and bleeding) was verbally given to the patient. Educational information regarding lower intestinal endoscopy was given to the patient. Written instructions for how to complete the bowel prep using Miralax were provided. The importance of drinking ample fluids to avoid dehydration as a result of the prep emphasized.   HPI, Physical Exam, Assessment and Plan have been scribed under the direction and in the presence of Donnalee Curry, MD.  Ples Specter, CMA  I have completed the exam and reviewed the above documentation for accuracy and completeness.  I agree with the above.  Museum/gallery conservator has been used and any errors in dictation or transcription are unintentional.  Donnalee Curry, M.D., F.A.C.S. Destiny Martin 03/12/2017, 9:29 PM   Patient has been scheduled for an upper and lower endoscopy on 03-27-17 at [facility]. Miralax prescription was sent to the patient's pharmacy today.   Nicholes Mango, CMA

## 2017-03-23 ENCOUNTER — Other Ambulatory Visit: Payer: Self-pay | Admitting: Internal Medicine

## 2017-03-23 NOTE — Telephone Encounter (Signed)
Pt last refill was on 12/19/16. Pt last OV was on 02/28/17. Ok to refill?

## 2017-03-26 ENCOUNTER — Encounter: Payer: Self-pay | Admitting: *Deleted

## 2017-03-27 ENCOUNTER — Encounter
Admission: RE | Disposition: A | Discharge status: Home / Self Care | Payer: Self-pay | Source: Ambulatory Visit | Attending: General Surgery

## 2017-03-27 ENCOUNTER — Ambulatory Visit
Admission: RE | Admit: 2017-03-27 | Discharge: 2017-03-27 | Disposition: A | Discharge status: Home / Self Care | Payer: BC Managed Care – PPO | Source: Ambulatory Visit | Attending: General Surgery | Admitting: General Surgery

## 2017-03-27 ENCOUNTER — Ambulatory Visit: Payer: BC Managed Care – PPO | Admitting: Anesthesiology

## 2017-03-27 DIAGNOSIS — Z1211 Encounter for screening for malignant neoplasm of colon: Secondary | ICD-10-CM | POA: Insufficient documentation

## 2017-03-27 DIAGNOSIS — I10 Essential (primary) hypertension: Secondary | ICD-10-CM | POA: Insufficient documentation

## 2017-03-27 DIAGNOSIS — K317 Polyp of stomach and duodenum: Secondary | ICD-10-CM | POA: Diagnosis not present

## 2017-03-27 DIAGNOSIS — F419 Anxiety disorder, unspecified: Secondary | ICD-10-CM | POA: Diagnosis not present

## 2017-03-27 DIAGNOSIS — K449 Diaphragmatic hernia without obstruction or gangrene: Secondary | ICD-10-CM | POA: Diagnosis not present

## 2017-03-27 DIAGNOSIS — Z8042 Family history of malignant neoplasm of prostate: Secondary | ICD-10-CM | POA: Diagnosis not present

## 2017-03-27 DIAGNOSIS — Z8249 Family history of ischemic heart disease and other diseases of the circulatory system: Secondary | ICD-10-CM | POA: Diagnosis not present

## 2017-03-27 DIAGNOSIS — K219 Gastro-esophageal reflux disease without esophagitis: Secondary | ICD-10-CM

## 2017-03-27 DIAGNOSIS — M1990 Unspecified osteoarthritis, unspecified site: Secondary | ICD-10-CM | POA: Insufficient documentation

## 2017-03-27 DIAGNOSIS — G4730 Sleep apnea, unspecified: Secondary | ICD-10-CM | POA: Diagnosis not present

## 2017-03-27 HISTORY — PX: COLONOSCOPY WITH PROPOFOL: SHX5780

## 2017-03-27 HISTORY — PX: ESOPHAGOGASTRODUODENOSCOPY (EGD) WITH PROPOFOL: SHX5813

## 2017-03-27 SURGERY — COLONOSCOPY WITH PROPOFOL
Anesthesia: General

## 2017-03-27 MED ORDER — GLYCOPYRROLATE 0.2 MG/ML IJ SOLN
INTRAMUSCULAR | Status: DC | PRN
  Administered 2017-03-27: 0.2 mg via INTRAVENOUS

## 2017-03-27 MED ORDER — SODIUM CHLORIDE 0.9 % IV SOLN
INTRAVENOUS | Status: DC
  Administered 2017-03-27: 14:00:00 via INTRAVENOUS

## 2017-03-27 MED ORDER — FENTANYL CITRATE (PF) 100 MCG/2ML IJ SOLN
INTRAMUSCULAR | Status: AC
  Filled 2017-03-27: qty 2

## 2017-03-27 MED ORDER — LIDOCAINE HCL (CARDIAC) 20 MG/ML IV SOLN
INTRAVENOUS | Status: DC | PRN
  Administered 2017-03-27: 40 mg via INTRAVENOUS

## 2017-03-27 MED ORDER — GLYCOPYRROLATE 0.2 MG/ML IJ SOLN
INTRAMUSCULAR | Status: AC
  Filled 2017-03-27: qty 1

## 2017-03-27 MED ORDER — PROPOFOL 10 MG/ML IV BOLUS
INTRAVENOUS | Status: DC | PRN
  Administered 2017-03-27: 70 mg via INTRAVENOUS

## 2017-03-27 MED ORDER — FENTANYL CITRATE (PF) 100 MCG/2ML IJ SOLN
INTRAMUSCULAR | Status: DC | PRN
  Administered 2017-03-27: 25 ug via INTRAVENOUS
  Administered 2017-03-27: 50 ug via INTRAVENOUS

## 2017-03-27 MED ORDER — MIDAZOLAM HCL 2 MG/2ML IJ SOLN
INTRAMUSCULAR | Status: AC
  Filled 2017-03-27: qty 2

## 2017-03-27 MED ORDER — PROPOFOL 500 MG/50ML IV EMUL
INTRAVENOUS | Status: AC
  Filled 2017-03-27: qty 50

## 2017-03-27 MED ORDER — MIDAZOLAM HCL 2 MG/2ML IJ SOLN
INTRAMUSCULAR | Status: DC | PRN
  Administered 2017-03-27: 2 mg via INTRAVENOUS

## 2017-03-27 MED ORDER — LIDOCAINE HCL (PF) 2 % IJ SOLN
INTRAMUSCULAR | Status: AC
  Filled 2017-03-27: qty 2

## 2017-03-27 MED ORDER — PHENYLEPHRINE HCL 10 MG/ML IJ SOLN
INTRAMUSCULAR | Status: DC | PRN
Start: 2017-03-27 — End: 2017-03-27
  Administered 2017-03-27: 100 ug via INTRAVENOUS
  Administered 2017-03-27: 50 ug via INTRAVENOUS

## 2017-03-27 MED ORDER — PROPOFOL 500 MG/50ML IV EMUL
INTRAVENOUS | Status: DC | PRN
  Administered 2017-03-27: 140 ug/kg/min via INTRAVENOUS

## 2017-03-27 NOTE — Op Note (Signed)
[facility] Gastroenterology Patient Name: Destiny Martin Procedure Date: 03/27/2017 2:51 PM MRN: [id number] Account #: [id number] Date of Birth: 1960/12/27 Admit Type: Outpatient Age: 56 Room: [facility] ENDO ROOM 1 Gender: Female Note Status: Finalized Procedure:            Colonoscopy Indications:          Screening for colorectal malignant neoplasm Providers:            Earline Mayotte, MD Medicines:            Monitored Anesthesia Care Complications:        No immediate complications. Procedure:            Pre-Anesthesia Assessment:                       - Prior to the procedure, a History and Physical was                        performed, and patient medications, allergies and                        sensitivities were reviewed. The patient's tolerance of                        previous anesthesia was reviewed.                       - The risks and benefits of the procedure and the                        sedation options and risks were discussed with the                        patient. All questions were answered and informed                        consent was obtained.                       After obtaining informed consent, the colonoscope was                        passed under direct vision. Throughout the procedure,                        the patient's blood pressure, pulse, and oxygen                        saturations were monitored continuously. The                        Colonoscope was introduced through the anus and                        advanced to the the cecum, identified by appendiceal                        orifice and ileocecal valve. The colonoscopy was                        performed without difficulty. The patient tolerated the  procedure well. The quality of the bowel preparation                        was excellent. Findings:      The entire examined colon appeared normal on direct and retroflexion        views. Impression:           - The entire examined colon is normal on direct and                        retroflexion views.                       - No specimens collected. Recommendation:       - Discharge patient to home (via wheelchair).                       - Repeat colonoscopy in 10 years for screening purposes. Procedure Code(s):    --- Professional ---                       [phone], Colonoscopy, flexible; diagnostic, including                        collection of specimen(s) by brushing or washing, when                        performed (separate procedure) Diagnosis Code(s):    --- Professional ---                       Z12.11, Encounter for screening for malignant neoplasm                        of colon CPT copyright 2016 American Medical Association. All rights reserved. The codes documented in this report are preliminary and upon coder review may  be revised to meet current compliance requirements. Earline Mayotte, MD 03/27/2017 3:36:39 PM This report has been signed electronically. Number of Addenda: 0 Note Initiated On: 03/27/2017 2:51 PM Scope Withdrawal Time: 0 hours 7 minutes 19 seconds  Total Procedure Duration: 0 hours 12 minutes 40 seconds       [facility]

## 2017-03-27 NOTE — Anesthesia Procedure Notes (Signed)
Date/Time: 03/27/2017 2:54 PM Performed by: Stormy Fabian Pre-anesthesia Checklist: Patient identified, Emergency Drugs available, Suction available and Patient being monitored Patient Re-evaluated:Patient Re-evaluated prior to inductionOxygen Delivery Method: Nasal cannula Intubation Type: IV induction Dental Injury: Teeth and Oropharynx as per pre-operative assessment  Comments: Nasal cannula with etCO2 monitoring

## 2017-03-27 NOTE — Anesthesia Postprocedure Evaluation (Signed)
Anesthesia Post Note  Patient: Destiny Martin  Procedure(s) Performed: Procedure(s) (LRB): COLONOSCOPY WITH PROPOFOL (N/A) ESOPHAGOGASTRODUODENOSCOPY (EGD) WITH PROPOFOL (N/A)  Patient location during evaluation: Endoscopy Anesthesia Type: General Level of consciousness: awake and alert Pain management: pain level controlled Vital Signs Assessment: post-procedure vital signs reviewed and stable Respiratory status: spontaneous breathing, nonlabored ventilation, respiratory function stable and patient connected to nasal cannula oxygen Cardiovascular status: blood pressure returned to baseline and stable Postop Assessment: no signs of nausea or vomiting Anesthetic complications: no     Last Vitals:  Vitals:   03/27/17 1602 03/27/17 1612  BP: 106/70 120/82  Pulse: 89 90  Resp: 16 15  Temp:      Last Pain:  Vitals:   03/27/17 1542  TempSrc: Tympanic                 Yarelis Ambrosino S

## 2017-03-27 NOTE — H&P (Signed)
No change in clinical history or exam. For EGD/ Colonoscopy.  

## 2017-03-27 NOTE — Op Note (Signed)
[facility] Gastroenterology Patient Name: Destiny Martin Procedure Date: 03/27/2017 2:51 PM MRN: [id number] Account #: [id number] Date of Birth: 06/12/1961 Admit Type: Outpatient Age: 56 Room: [facility] ENDO ROOM 1 Gender: Female Note Status: Finalized Procedure:            Upper GI endoscopy Indications:          Suspected esophageal reflux Providers:            Earline Mayotte, MD Referring MD:         Duncan Dull, MD (Referring MD) Medicines:            Monitored Anesthesia Care Complications:        No immediate complications. Procedure:            Pre-Anesthesia Assessment:                       - Prior to the procedure, a History and Physical was                        performed, and patient medications, allergies and                        sensitivities were reviewed. The patient's tolerance of                        previous anesthesia was reviewed.                       - The risks and benefits of the procedure and the                        sedation options and risks were discussed with the                        patient. All questions were answered and informed                        consent was obtained.                       After obtaining informed consent, the endoscope was                        passed under direct vision. Throughout the procedure,                        the patient's blood pressure, pulse, and oxygen                        saturations were monitored continuously. The Endoscope                        was introduced through the mouth, and advanced to the                        third part of duodenum. The upper GI endoscopy was                        accomplished without difficulty. The patient tolerated  the procedure well. Findings:      A medium-sized hiatal hernia was present. Hiatus at 30 cm, GEJ at 28 cm.       Normal appearing mucosa.      Multiple 5 mm sessile polyps with no stigmata of recent bleeding  were       found on the greater curvature of the gastric body. Biopsies were taken       with a cold forceps for histology.      The examined duodenum was normal. Impression:           - Medium-sized hiatal hernia.                       - Multiple gastric polyps. Biopsied.                       - Normal examined duodenum. Recommendation:       - Telephone endoscopist for pathology results in 1 week.                       - Perform a colonoscopy today. Procedure Code(s):    --- Professional ---                       [phone], Esophagogastroduodenoscopy, flexible, transoral;                        with biopsy, single or multiple Diagnosis Code(s):    --- Professional ---                       K44.9, Diaphragmatic hernia without obstruction or                        gangrene                       K31.7, Polyp of stomach and duodenum CPT copyright 2016 American Medical Association. All rights reserved. The codes documented in this report are preliminary and upon coder review may  be revised to meet current compliance requirements. Earline Mayotte, MD 03/27/2017 3:19:02 PM This report has been signed electronically. Number of Addenda: 0 Note Initiated On: 03/27/2017 2:51 PM      [facility]

## 2017-03-27 NOTE — Anesthesia Preprocedure Evaluation (Signed)
Anesthesia Evaluation  Patient identified by MRN, date of birth, ID band Patient awake    Reviewed: Allergy & Precautions, NPO status , Patient's Chart, lab work & pertinent test results  History of Anesthesia Complications Negative for: history of anesthetic complications  Airway Mallampati: II       Dental   Pulmonary sleep apnea (not using CPAP) ,           Cardiovascular hypertension, Pt. on medications      Neuro/Psych Anxiety    GI/Hepatic Neg liver ROS, GERD  Medicated and Poorly Controlled,  Endo/Other  negative endocrine ROS  Renal/GU negative Renal ROS     Musculoskeletal  (+) Arthritis ,   Abdominal   Peds  Hematology  (+) anemia ,   Anesthesia Other Findings   Reproductive/Obstetrics                             Anesthesia Physical Anesthesia Plan  ASA: II  Anesthesia Plan: General   Post-op Pain Management:    Induction: Intravenous  Airway Management Planned: Nasal Cannula  Additional Equipment:   Intra-op Plan:   Post-operative Plan:   Informed Consent: I have reviewed the patients History and Physical, chart, labs and discussed the procedure including the risks, benefits and alternatives for the proposed anesthesia with the patient or authorized representative who has indicated his/her understanding and acceptance.     Plan Discussed with:   Anesthesia Plan Comments:         Anesthesia Quick Evaluation

## 2017-03-27 NOTE — Anesthesia Post-op Follow-up Note (Cosign Needed)
Anesthesia QCDR form completed.        

## 2017-03-27 NOTE — Transfer of Care (Signed)
Immediate Anesthesia Transfer of Care Note  Patient: Destiny Martin  Procedure(s) Performed: Procedure(s): COLONOSCOPY WITH PROPOFOL (N/A) ESOPHAGOGASTRODUODENOSCOPY (EGD) WITH PROPOFOL (N/A)  Patient Location: PACU and Endoscopy Unit  Anesthesia Type:General  Level of Consciousness: sedated  Airway & Oxygen Therapy: Patient Spontanous Breathing and Patient connected to nasal cannula oxygen  Post-op Assessment: Report given to RN and Post -op Vital signs reviewed and stable  Post vital signs: Reviewed and stable  Last Vitals:  Vitals:   03/27/17 1354 03/27/17 1542  BP: 127/86 101/82  Pulse: 93 94  Resp: 18 15  Temp: 36.7 C 36.4 C    Complications: No apparent anesthesia complications

## 2017-03-28 ENCOUNTER — Encounter: Payer: Self-pay | Admitting: General Surgery

## 2017-03-29 LAB — SURGICAL PATHOLOGY

## 2017-03-30 ENCOUNTER — Telehealth: Payer: Self-pay

## 2017-03-30 NOTE — Telephone Encounter (Signed)
-----   Message from Earline Mayotte, MD sent at 03/30/2017  7:29 AM EDT ----- Please notify biopsies OK. Arrange f/u to discuss options for management of reflux symptoms. Thanks.  ----- Message ----- From: Interface, Lab In Three Zero One Sent: 03/29/2017   5:41 PM To: Earline Mayotte, MD

## 2017-03-30 NOTE — Telephone Encounter (Signed)
Notified patient as instructed, patient pleased. Discussed follow-up appointments, patient agrees  

## 2017-04-09 ENCOUNTER — Ambulatory Visit: Payer: BC Managed Care – PPO | Admitting: General Surgery

## 2017-04-10 ENCOUNTER — Ambulatory Visit
Admission: RE | Admit: 2017-04-10 | Discharge: 2017-04-10 | Disposition: A | Discharge status: Home / Self Care | Payer: BC Managed Care – PPO | Source: Ambulatory Visit | Attending: Internal Medicine | Admitting: Internal Medicine

## 2017-04-10 ENCOUNTER — Encounter: Payer: Self-pay | Admitting: Radiology

## 2017-04-10 DIAGNOSIS — Z1231 Encounter for screening mammogram for malignant neoplasm of breast: Secondary | ICD-10-CM | POA: Insufficient documentation

## 2017-04-10 DIAGNOSIS — Z1239 Encounter for other screening for malignant neoplasm of breast: Secondary | ICD-10-CM

## 2017-05-09 ENCOUNTER — Other Ambulatory Visit: Payer: Self-pay | Admitting: Internal Medicine

## 2017-05-19 ENCOUNTER — Other Ambulatory Visit: Payer: Self-pay | Admitting: Internal Medicine

## 2017-06-01 ENCOUNTER — Other Ambulatory Visit: Payer: Self-pay | Admitting: Internal Medicine

## 2017-06-15 ENCOUNTER — Other Ambulatory Visit: Payer: Self-pay | Admitting: Internal Medicine

## 2017-06-19 ENCOUNTER — Encounter: Payer: Self-pay | Admitting: *Deleted

## 2017-07-25 ENCOUNTER — Other Ambulatory Visit: Payer: Self-pay | Admitting: Internal Medicine

## 2017-09-26 ENCOUNTER — Other Ambulatory Visit: Payer: Self-pay | Admitting: Internal Medicine

## 2017-12-13 ENCOUNTER — Other Ambulatory Visit: Payer: Self-pay | Admitting: Internal Medicine

## 2017-12-13 NOTE — Telephone Encounter (Signed)
Okay to refill? Last OV was in March 2018. No future appt scheduled

## 2017-12-14 NOTE — Telephone Encounter (Signed)
Refill  LOSARTAN for 30 days only.  BMET NEEDED PRIOR  to any more refills

## 2018-01-11 ENCOUNTER — Other Ambulatory Visit: Payer: Self-pay | Admitting: Internal Medicine

## 2018-01-18 ENCOUNTER — Other Ambulatory Visit: Payer: Self-pay | Admitting: Internal Medicine

## 2018-02-05 IMAGING — MG MM DIGITAL SCREENING BILAT W/ TOMO W/ CAD
8 of 12 series · 8 of 28 positions shown · non-contrast
Comparison: Previous exam(s).

CLINICAL DATA: Screening.

EXAM:
2D DIGITAL SCREENING BILATERAL MAMMOGRAM WITH CAD AND ADJUNCT TOMO

[L MLO synth-2D]
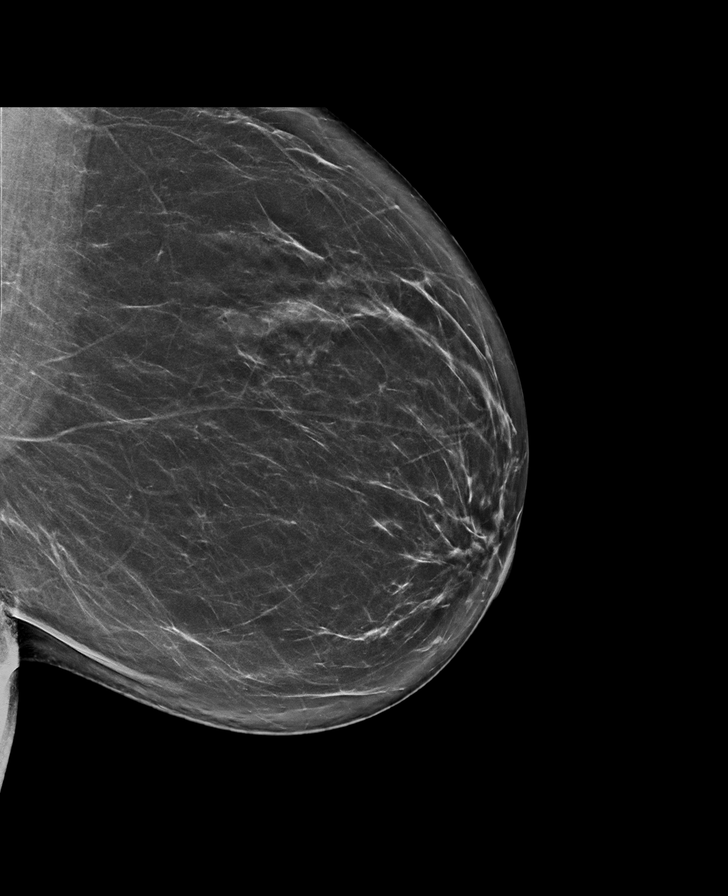

[R MLO synth-2D]
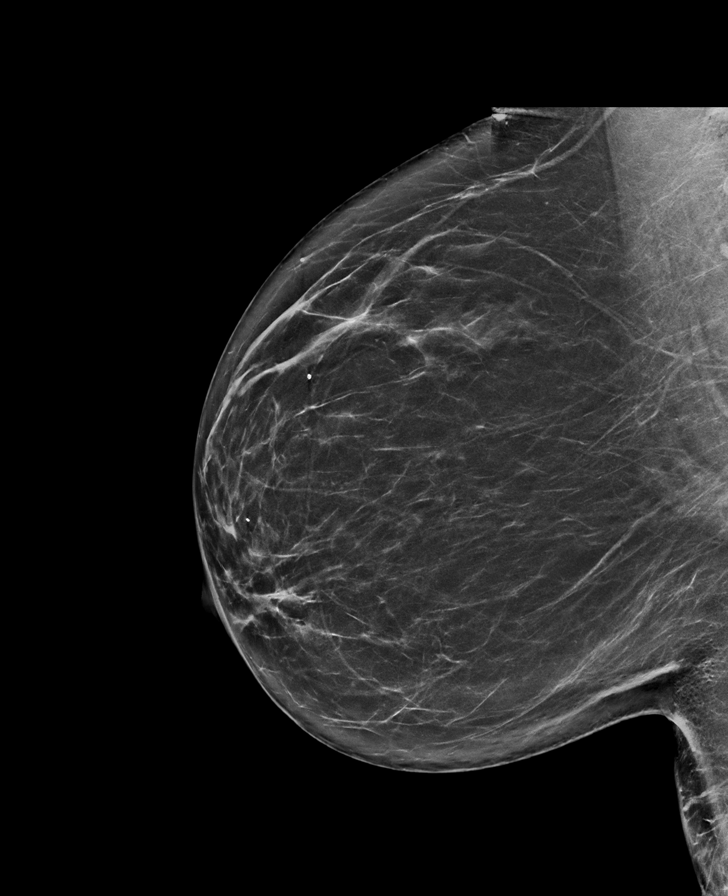

[L CC]
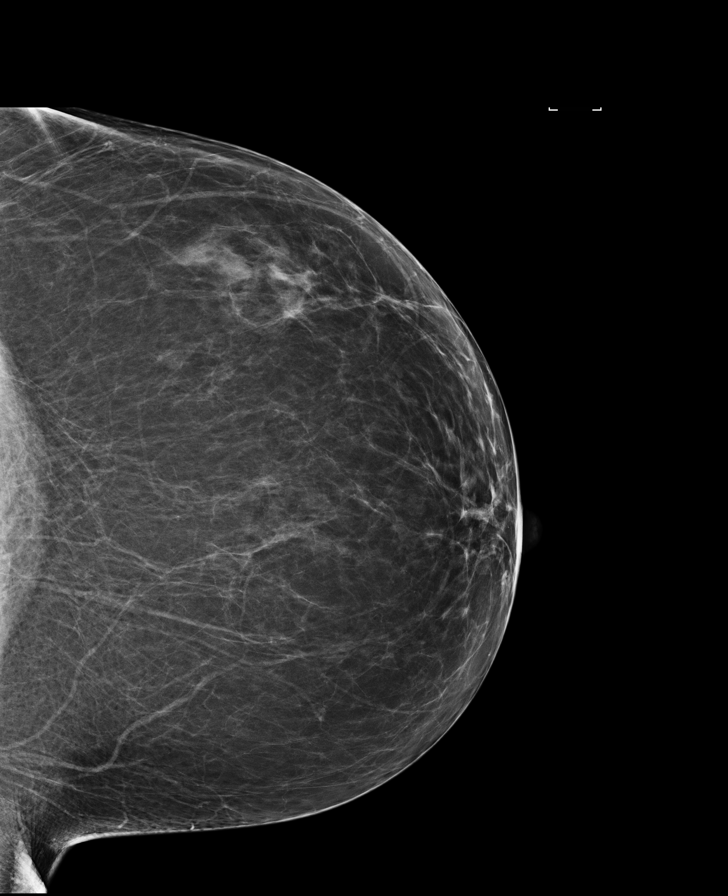

[L MLO]
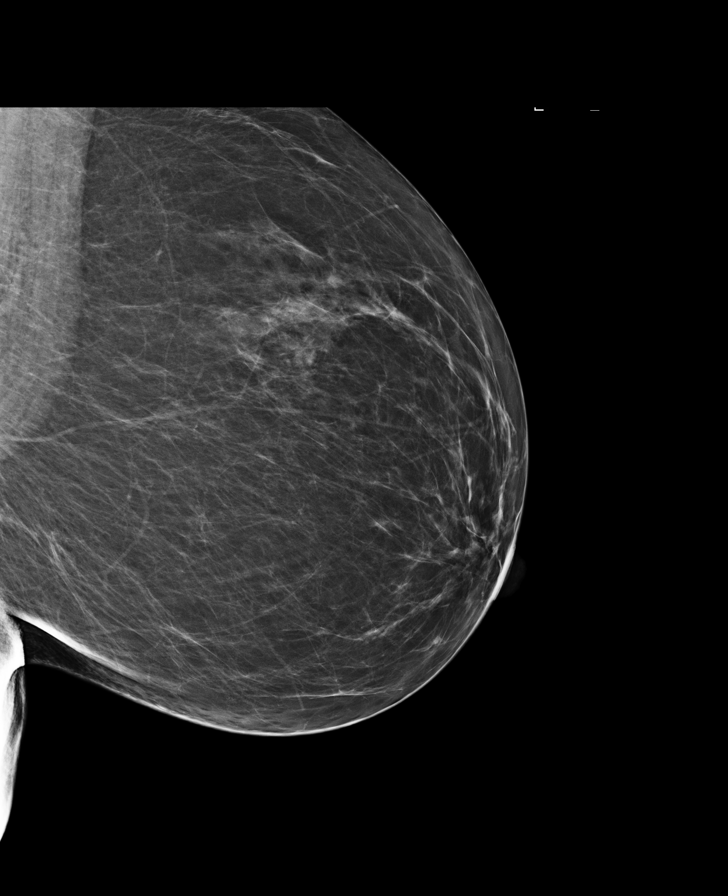

[R MLO]
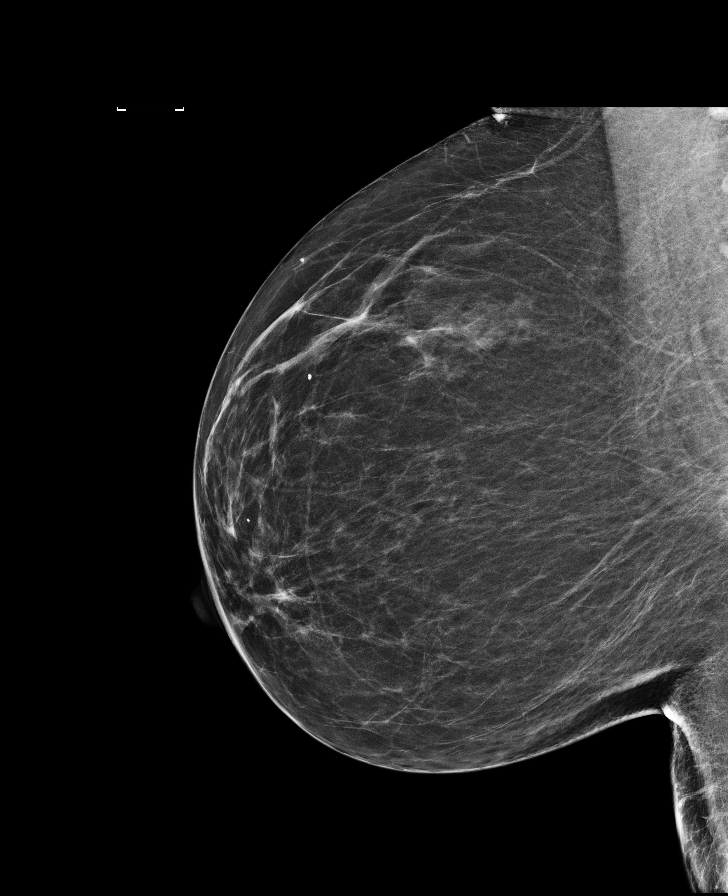

[L CC synth-2D]
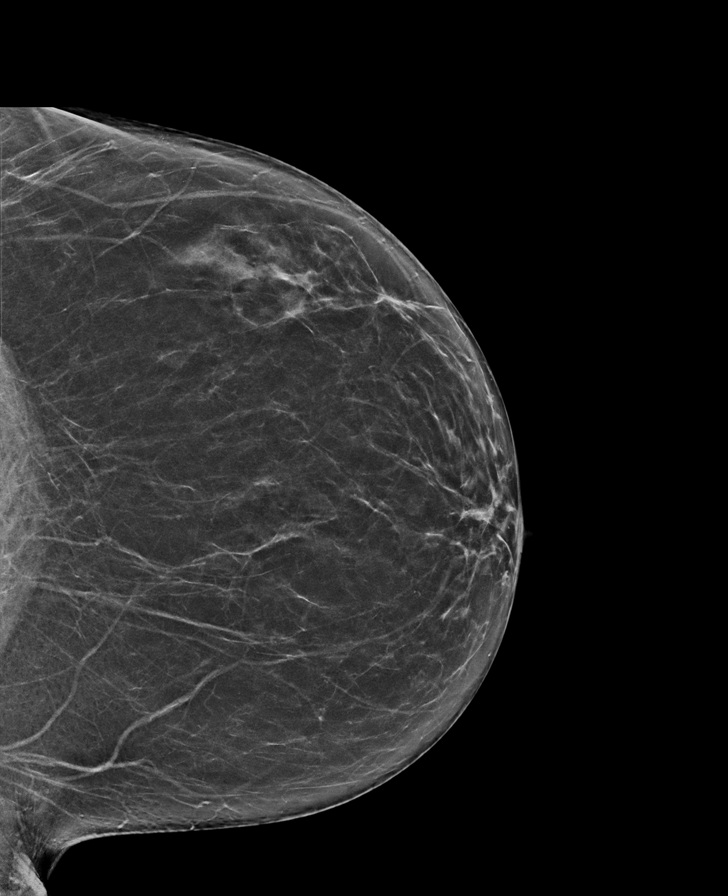

[R CC synth-2D]
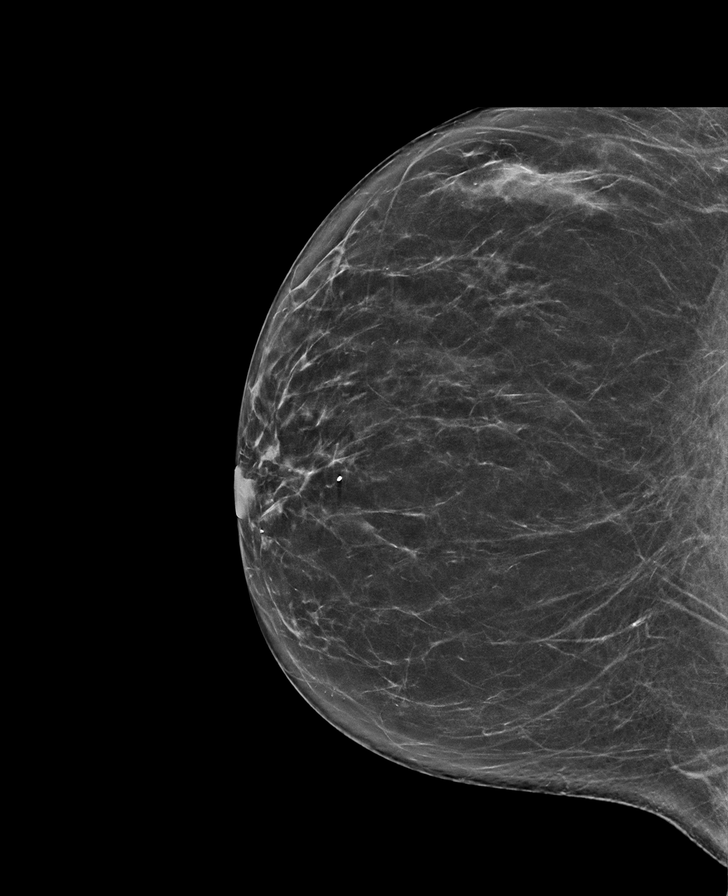

[R CC]
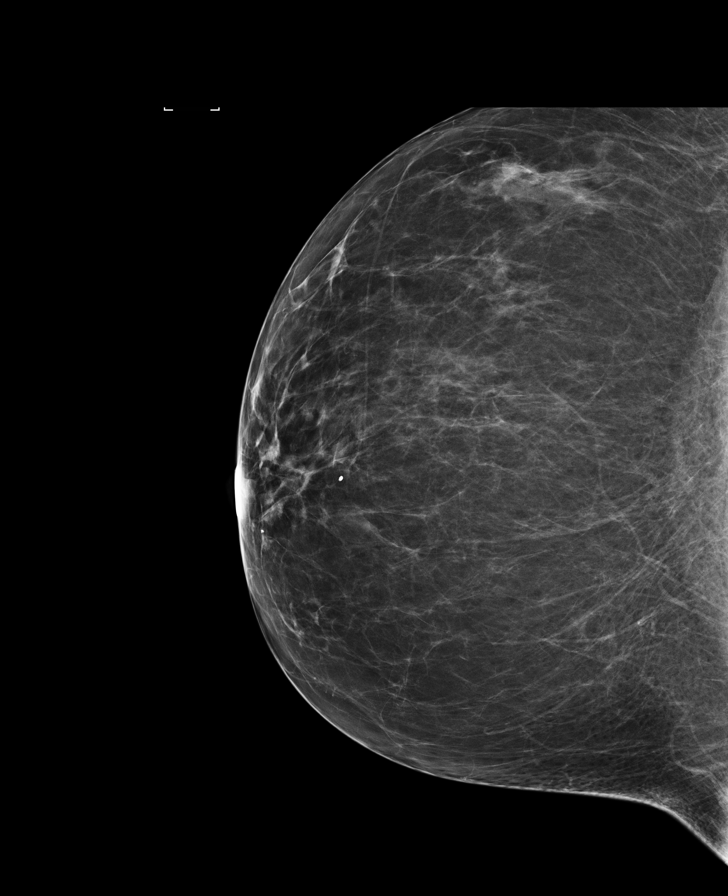

[8 of 28 positions shown; findings below may reference images not displayed]

ACR Breast Density Category b: There are scattered areas of
fibroglandular density.
FINDINGS: There are no findings suspicious for malignancy. Images were
processed with CAD.
IMPRESSION: No mammographic evidence of malignancy. A result letter of this
screening mammogram will be mailed directly to the patient.

RECOMMENDATION:
Screening mammogram in one year. (Code:97-6-RS4)

BI-RADS CATEGORY  1: Negative.

## 2018-02-12 ENCOUNTER — Other Ambulatory Visit: Payer: Self-pay | Admitting: Internal Medicine

## 2018-02-12 NOTE — Telephone Encounter (Signed)
Trazodone    Refilled: 09/26/2017  Esomeprazole    Refilled: 09/26/2017  Last OV: 02/28/2017 Next OV: not scheduled

## 2018-03-12 ENCOUNTER — Ambulatory Visit: Payer: BC Managed Care – PPO | Admitting: General Surgery

## 2018-03-12 ENCOUNTER — Encounter: Payer: Self-pay | Admitting: General Surgery

## 2018-03-12 VITALS — BP 142/70 | HR 72 | Resp 14 | Ht 62.0 in | Wt 151.0 lb

## 2018-03-12 DIAGNOSIS — K219 Gastro-esophageal reflux disease without esophagitis: Secondary | ICD-10-CM | POA: Diagnosis not present

## 2018-03-12 NOTE — Patient Instructions (Addendum)
Follow up appointment to be announced. May put blocks under the head of her bed. The patient is aware to call back for any questions or concerns..Marland Kitchen

## 2018-03-12 NOTE — Progress Notes (Signed)
Patient ID: Destiny Martin, female   DOB: 16-Jun-1961, 57 y.o.   MRN: [id number]  Chief Complaint  Patient presents with  . Follow-up    HPI Destiny Martin is a 57 y.o. female here today for her follow up upper endoscopy and colonoscopy done 03/27/2017. Patient takes Nexium daily. Patient states the other day she felt as if she was experiencing an episode of reflux and had to episodes of vomiting.  The second had a small amount of pink blood.  No further episodes..  This occurred about 3 weeks ago.  She states she did not have any thing to eat but a hand full of peanuts.Sometimes she gets up in the middle of the night with acid reflex.  No black stools noticed. Moves her bowels daily.    Past Medical History:  Diagnosis Date  . ADD (attention deficit disorder)   . Allergy   . GERD (gastroesophageal reflux disease)   . Hypertension     Past Surgical History:  Procedure Laterality Date  . COLONOSCOPY WITH PROPOFOL N/A 03/27/2017   Procedure: COLONOSCOPY WITH PROPOFOL;  Surgeon: Earline Mayotte, MD;  Location: [facility] ENDOSCOPY;  Service: Endoscopy;  Laterality: N/A;  . ESOPHAGOGASTRODUODENOSCOPY (EGD) WITH PROPOFOL N/A 03/27/2017   Procedure: ESOPHAGOGASTRODUODENOSCOPY (EGD) WITH PROPOFOL;  Surgeon: Earline Mayotte, MD;  Location: [facility] ENDOSCOPY;  Service: Endoscopy;  Laterality: N/A;  . ESOPHAGOGASTRODUODENOSCOPY ENDOSCOPY     2008   . FOOT SURGERY Right 2007  . NECK SURGERY  2016  . TONSILLECTOMY  1974    Family History  Problem Relation Age of Onset  . Hypertension Father   . Heart disease Father   . Prostate cancer Father     Social History Social History   Tobacco Use  . Smoking status: Never Smoker  . Smokeless tobacco: Never Used  Substance Use Topics  . Alcohol use: Yes    Comment: moderate  . Drug use: No    Allergies  Allergen Reactions  . Erythromycin Nausea Only  . Lisinopril Swelling    Angio-edema  . Penicillins   . Sulfa Antibiotics Rash     Current Outpatient Medications  Medication Sig Dispense Refill  . celecoxib (CELEBREX) 200 MG capsule TAKE 1 CAPSULE (200 MG TOTAL) BY MOUTH 2 (TWO) TIMES DAILY. 180 capsule 0  . cholecalciferol (VITAMIN D) 400 units TABS tablet Take 400 Units by mouth.    . cyanocobalamin (,VITAMIN B-12,) 1000 MCG/ML injection INJECT 1 ML (1,000 MCG TOTAL) INTO THE MUSCLE EVERY 30 (THIRTY) DAYS. 10 mL 0  . ergocalciferol (DRISDOL) 50000 units capsule Take 1 capsule (50,000 Units total) by mouth once a week. 4 capsule 1  . esomeprazole (NEXIUM) 40 MG capsule TAKE 1 CAPSULE (40 MG TOTAL) BY MOUTH 2 (TWO) TIMES DAILY BEFORE A MEAL. 60 capsule 3  . FeFum-FePo-FA-B Cmp-C-Zn-Mn-Cu (SE-TAN PLUS) 162-115.2-1 MG CAPS TAKE 1 CAPSULE BY MOUTH DAILY AFTER SUPPER. 30 capsule 5  . fluticasone (FLONASE) 50 MCG/ACT nasal spray INHALE 1 SPRAY BY INTRANASAL ROUTE EVERY DAY IN EACH NOSTRIL 16 g 0  . JUNEL FE 1/20 1-20 MG-MCG tablet TAKE 1 TABLET BY MOUTH DAILY. 84 tablet 2  . losartan (COZAAR) 50 MG tablet TAKE 1 TABLET BY MOUTH EVERY DAY 30 tablet 0  . traZODone (DESYREL) 50 MG tablet TAKE 0.5-1 TABLETS (25-50 MG TOTAL) BY MOUTH AT BEDTIME AS NEEDED FOR SLEEP. 30 tablet 3   No current facility-administered medications for this visit.     Review of Systems Review of  Systems  Constitutional: Negative.   Respiratory: Negative.   Cardiovascular: Negative.   Gastrointestinal: Negative.     Blood pressure (!) 142/70, pulse 72, resp. rate 14, height 5\' 2"  (1.575 m), weight 151 lb (68.5 kg).  Physical Exam Physical Exam  Constitutional: She is oriented to person, place, and time. She appears well-developed and well-nourished.  Neurological: She is alert and oriented to person, place, and time.  Skin: Skin is warm and dry.    Data Reviewed 2018 upper and lower endoscopy.  Assessment    General good control of reflux, nocturnal eating producing troublesome symptoms.    Plan  May put blocks under the head of her  bed.   Case reviewed informally with gastroenterology.  No indication for repeat endoscopy with a single episode of hematemesis.  Patient encouraged to have a more regular diet and to avoid eating late at night.  The patient is aware to call back for any questions or concerns.  Repeat colonoscopy in 2028, earlier if symptoms occur. HPI, Physical Exam, Assessment and Plan have been scribed under the direction and in the presence of Donnalee CurryJeffrey Quanta Roher, MD.  Ples SpecterJessica Qualls, CMA  I have completed the exam and reviewed the above documentation for accuracy and completeness.  I agree with the above.  [employer] has been used and any errors in dictation or transcription are unintentional.  Donnalee CurryJeffrey Jacoria Keiffer, M.D., F.A.C.S.   Merrily PewJeffrey W Marvel Sapp 03/13/2018, 4:35 PM

## 2018-03-13 ENCOUNTER — Telehealth: Payer: Self-pay

## 2018-03-13 NOTE — Telephone Encounter (Signed)
Notified patient as instructed, patient pleased. Discussed follow-up appointments, patient agrees  

## 2018-03-13 NOTE — Telephone Encounter (Signed)
-----   Message from Destiny MayotteJeffrey W Byrnett, MD sent at 03/13/2018  4:37 PM EDT ----- These notify the patient I reviewed her case with GI.  No indication for repeat endoscopy at this time.  She should promptly report any recurrent episodes of vomiting blood.

## 2018-04-07 ENCOUNTER — Other Ambulatory Visit: Payer: Self-pay | Admitting: Internal Medicine

## 2018-04-23 ENCOUNTER — Other Ambulatory Visit: Payer: Self-pay | Admitting: Internal Medicine

## 2018-04-23 NOTE — Telephone Encounter (Signed)
Refilled: 07/26/2017 Last OV: 02/28/2017 Next OV: not scheduled

## 2018-04-24 ENCOUNTER — Other Ambulatory Visit: Payer: Self-pay | Admitting: Internal Medicine

## 2018-05-04 ENCOUNTER — Telehealth: Payer: Self-pay | Admitting: Internal Medicine

## 2018-05-04 DIAGNOSIS — I10 Essential (primary) hypertension: Secondary | ICD-10-CM

## 2018-05-04 DIAGNOSIS — D510 Vitamin B12 deficiency anemia due to intrinsic factor deficiency: Secondary | ICD-10-CM

## 2018-05-06 NOTE — Telephone Encounter (Signed)
Refilled: 01/11/2018 Last OV: 02/28/2017 Next OV: not scheduled

## 2018-05-07 NOTE — Telephone Encounter (Signed)
LMTCB. Need to schedule pt a follow up appt with Dr. Darrick Huntsmanullo and a fasting lab appt a few days prior to office visit. Labs are ordered. PEC may speak with pt.

## 2018-05-07 NOTE — Telephone Encounter (Signed)
Refill for 30 days only.  OFFICE VISIT NEEDED prior to any more refills please call patient . FASTING LABS ORDERED

## 2018-05-08 NOTE — Telephone Encounter (Signed)
Labs scheduled for 06/10/18 Dr Darrick Huntsmanullo 06/14/18

## 2018-06-05 ENCOUNTER — Other Ambulatory Visit: Payer: Self-pay | Admitting: Internal Medicine

## 2018-06-10 ENCOUNTER — Other Ambulatory Visit (INDEPENDENT_AMBULATORY_CARE_PROVIDER_SITE_OTHER): Payer: BC Managed Care – PPO

## 2018-06-10 DIAGNOSIS — D510 Vitamin B12 deficiency anemia due to intrinsic factor deficiency: Secondary | ICD-10-CM

## 2018-06-10 DIAGNOSIS — I10 Essential (primary) hypertension: Secondary | ICD-10-CM | POA: Diagnosis not present

## 2018-06-10 LAB — COMPREHENSIVE METABOLIC PANEL
ALK PHOS: 61 U/L (ref 39–117)
ALT: 10 U/L (ref 0–35)
AST: 14 U/L (ref 0–37)
Albumin: 4.1 g/dL (ref 3.5–5.2)
BILIRUBIN TOTAL: 0.5 mg/dL (ref 0.2–1.2)
BUN: 14 mg/dL (ref 6–23)
CALCIUM: 9.6 mg/dL (ref 8.4–10.5)
CO2: 30 meq/L (ref 19–32)
CREATININE: 0.83 mg/dL (ref 0.40–1.20)
Chloride: 102 mEq/L (ref 96–112)
GFR: 75.73 mL/min (ref >60.00)
GLUCOSE: 90 mg/dL (ref 70–99)
Potassium: 4.2 mEq/L (ref 3.5–5.1)
SODIUM: 140 meq/L (ref 135–145)
Total Protein: 6.6 g/dL (ref 6.0–8.3)

## 2018-06-10 LAB — LIPID PANEL
CHOL/HDL RATIO: 3
Cholesterol: 171 mg/dL (ref 0–200)
HDL: 56.9 mg/dL (ref >39.00)
LDL CALC: 98 mg/dL (ref 0–99)
NONHDL: 114.5
TRIGLYCERIDES: 82 mg/dL (ref 0.0–149.0)
VLDL: 16.4 mg/dL (ref 0.0–40.0)

## 2018-06-10 LAB — VITAMIN B12: Vitamin B-12: 517 pg/mL (ref 211–911)

## 2018-06-10 LAB — TSH: TSH: 2.73 u[IU]/mL (ref 0.35–4.50)

## 2018-06-12 ENCOUNTER — Other Ambulatory Visit: Payer: Self-pay | Admitting: Internal Medicine

## 2018-06-12 DIAGNOSIS — Z1231 Encounter for screening mammogram for malignant neoplasm of breast: Secondary | ICD-10-CM

## 2018-06-14 ENCOUNTER — Encounter: Payer: Self-pay | Admitting: Internal Medicine

## 2018-06-14 ENCOUNTER — Ambulatory Visit (INDEPENDENT_AMBULATORY_CARE_PROVIDER_SITE_OTHER): Payer: BC Managed Care – PPO

## 2018-06-14 ENCOUNTER — Ambulatory Visit: Payer: BC Managed Care – PPO | Admitting: Internal Medicine

## 2018-06-14 VITALS — BP 110/76 | HR 87 | Temp 97.6°F | Ht 61.0 in | Wt 153.0 lb

## 2018-06-14 DIAGNOSIS — M79671 Pain in right foot: Secondary | ICD-10-CM

## 2018-06-14 DIAGNOSIS — Z1231 Encounter for screening mammogram for malignant neoplasm of breast: Secondary | ICD-10-CM | POA: Diagnosis not present

## 2018-06-14 DIAGNOSIS — I10 Essential (primary) hypertension: Secondary | ICD-10-CM | POA: Diagnosis not present

## 2018-06-14 DIAGNOSIS — F4329 Adjustment disorder with other symptoms: Secondary | ICD-10-CM

## 2018-06-14 DIAGNOSIS — Z634 Disappearance and death of family member: Secondary | ICD-10-CM

## 2018-06-14 DIAGNOSIS — Z1239 Encounter for other screening for malignant neoplasm of breast: Secondary | ICD-10-CM

## 2018-06-14 DIAGNOSIS — K219 Gastro-esophageal reflux disease without esophagitis: Secondary | ICD-10-CM | POA: Diagnosis not present

## 2018-06-14 DIAGNOSIS — F4321 Adjustment disorder with depressed mood: Secondary | ICD-10-CM

## 2018-06-14 DIAGNOSIS — Z0000 Encounter for general adult medical examination without abnormal findings: Secondary | ICD-10-CM

## 2018-06-14 MED ORDER — TRAMADOL HCL 50 MG PO TABS: 50.0000 mg | ORAL_TABLET | Freq: Three times a day (TID) | ORAL | 0 refills | Status: DC | PRN

## 2018-06-14 NOTE — Progress Notes (Signed)
Subjective:  Patient ID: Destiny Martin, female    DOB: 1961-10-12  Age: 57 y.o. MRN: [id number]  CC: The primary encounter diagnosis was Encounter for preventive health examination. Diagnoses of Foot pain, right, Breast cancer screening, Gastroesophageal reflux disease without esophagitis, Complicated grief, Essential hypertension, and Acute foot pain, right were also pertinent to this visit.  HPI Destiny Martin presents for follow up on annual preventive exa and follow up on chronic and acute issues Last seen March 2018   Cc: Right foot pain.   Right midfoot and forefoot pain for the past  month after dropping a heavy shelf  on foot.  Has been unable to wear her usual 3 inch heels due to persistent pain. No prior x rays   2) ADD:  Has been Seeing Evelene Croon for ADHD has been taaking adderall for over 5 years.  Using xanax prn .   3( wrist pain: Needs CT release on right wrist.   Constantly numb . Affecting work ability to [employer] her gun  Physiological scientist) Moderate CTS  Has been  On hold since cervical decompression  Surgery Nov 20, 2016  4) Grief:  Father died 12/31/22 5) OSA: has been wearing CPAP for the past 2 months.    6)GERD :  Severe,  Had an episode of  blood streaked emesis. Was seen by Dr Lemar Livings recently   And  prescribed nexium 40 mg two times daily .  She has been taking 80 mg once daily         Outpatient Medications Prior to Visit  Medication Sig Dispense Refill  . ALPRAZolam (XANAX) 0.5 MG tablet Take 0.5 mg by mouth 3 (three) times daily as needed.  5  . amphetamine-dextroamphetamine (ADDERALL XR) 30 MG 24 hr capsule Take 30 mg by mouth.  0  . amphetamine-dextroamphetamine (ADDERALL) 30 MG tablet Take 1 tablet by mouth 2 (two) times daily.  0  . celecoxib (CELEBREX) 200 MG capsule TAKE 1 CAPSULE (200 MG TOTAL) BY MOUTH 2 (TWO) TIMES DAILY. 180 capsule 0  . cholecalciferol (VITAMIN D) 400 units TABS tablet Take 400 Units by mouth.    . cyanocobalamin (,VITAMIN B-12,) 1000  MCG/ML injection INJECT 1 ML (1,000 MCG TOTAL) INTO THE MUSCLE EVERY 30 (THIRTY) DAYS. 10 mL 0  . esomeprazole (NEXIUM) 40 MG capsule TAKE 1 CAPSULE (40 MG TOTAL) BY MOUTH 2 (TWO) TIMES DAILY BEFORE A MEAL. 60 capsule 0  . losartan (COZAAR) 50 MG tablet TAKE 1 TABLET BY MOUTH EVERY DAY 30 tablet 0  . traZODone (DESYREL) 50 MG tablet TAKE 0.5-1 TABLETS (25-50 MG TOTAL) BY MOUTH AT BEDTIME AS NEEDED FOR SLEEP. 30 tablet 3  . ergocalciferol (DRISDOL) 50000 units capsule Take 1 capsule (50,000 Units total) by mouth once a week. 4 capsule 1  . FeFum-FePo-FA-B Cmp-C-Zn-Mn-Cu (SE-TAN PLUS) 162-115.2-1 MG CAPS TAKE 1 CAPSULE BY MOUTH DAILY AFTER SUPPER. 30 capsule 5  . fluticasone (FLONASE) 50 MCG/ACT nasal spray INHALE 1 SPRAY BY INTRANASAL ROUTE EVERY DAY IN EACH NOSTRIL 16 g 0  . JUNEL FE 1/20 1-20 MG-MCG tablet TAKE 1 TABLET BY MOUTH DAILY. 84 tablet 2   No facility-administered medications prior to visit.     Review of Systems;  Patient denies headache, fevers, malaise, unintentional weight loss, skin rash, eye pain, sinus congestion and sinus pain, sore throat, dysphagia,  hemoptysis , cough, dyspnea, wheezing, chest pain, palpitations, orthopnea, edema, abdominal pain, nausea, melena, diarrhea, constipation, flank pain, dysuria, hematuria, urinary  Frequency,  nocturia, numbness, tingling, seizures,  Focal weakness, Loss of consciousness,  Tremor, insomnia, depression, anxiety, and suicidal ideation.      Objective:  BP 110/76   Pulse 87   Temp 97.6 F (36.4 C) (Oral)   Ht 5\' 1"  (1.549 m)   Wt 153 lb (69.4 kg)   LMP 01/15/2018 (Approximate)   SpO2 98%   BMI 28.91 kg/m   BP Readings from Last 3 Encounters:  06/14/18 110/76  03/12/18 (!) 142/70  03/27/17 120/82    Wt Readings from Last 3 Encounters:  06/14/18 153 lb (69.4 kg)  03/12/18 151 lb (68.5 kg)  03/27/17 146 lb (66.2 kg)    General appearance: alert, cooperative and appears stated age Ears: normal TM's and external ear  canals both ears Throat: lips, mucosa, and tongue normal; teeth and gums normal Neck: no adenopathy, no carotid bruit, supple, symmetrical, trachea midline and thyroid not enlarged, symmetric, no tenderness/mass/nodules Back: symmetric, no curvature. ROM normal. No CVA tenderness. Lungs: clear to auscultation bilaterally Heart: regular rate and rhythm, S1, S2 normal, no murmur, click, rub or gallop Abdomen: soft, non-tender; bowel sounds normal; no masses,  no organomegaly Pulses: 2+ and symmetric Skin: Skin color, texture, turgor normal. No rashes or lesions Lymph nodes: Cervical, supraclavicular, and axillary nodes normal.  No results found for: HGBA1C  Lab Results  Component Value Date   CREATININE 0.83 06/10/2018   CREATININE 0.83 02/28/2017   CREATININE 0.81 01/17/2016    Lab Results  Component Value Date   WBC 7.8 02/28/2017   HGB 13.1 02/28/2017   HCT 39.1 02/28/2017   PLT 406.0 (H) 02/28/2017   GLUCOSE 90 06/10/2018   CHOL 171 06/10/2018   TRIG 82.0 06/10/2018   HDL 56.90 06/10/2018   LDLCALC 98 06/10/2018   ALT 10 06/10/2018   AST 14 06/10/2018   NA 140 06/10/2018   K 4.2 06/10/2018   CL 102 06/10/2018   CREATININE 0.83 06/10/2018   BUN 14 06/10/2018   CO2 30 06/10/2018   TSH 2.73 06/10/2018    Mm Screening Breast Tomo Bilateral  Result Date: 04/10/2017 CLINICAL DATA:  Screening. EXAM: 2D DIGITAL SCREENING BILATERAL MAMMOGRAM WITH CAD AND ADJUNCT TOMO COMPARISON:  Previous exam(s). ACR Breast Density Category b: There are scattered areas of fibroglandular density. FINDINGS: There are no findings suspicious for malignancy. Images were processed with CAD. IMPRESSION: No mammographic evidence of malignancy. A result letter of this screening mammogram will be mailed directly to the patient. RECOMMENDATION: Screening mammogram in one year. (Code:SM-B-01Y) BI-RADS CATEGORY  1: Negative. Electronically Signed   By: Ted Mcalpineobrinka  Dimitrova M.D.   On: 04/10/2017 12:38     Assessment & Plan:   Problem List Items Addressed This Visit    Acute foot pain, right    Plain films done today to rule out fracture were negative for recent fractures. Podiatry referral advised.       Complicated grief    Now resolved.       Encounter for preventive health examination - Primary    Annual comprehensive preventive exam was done as well as an evaluation and management of chronic conditions .  During the course of the visit the patient was educated and counseled about appropriate screening and preventive services including :  diabetes screening, lipid analysis with projected  10 year  risk for CAD , nutrition counseling, breast, cervical and colorectal cancer screening, and recommended immunizations.  Printed recommendations for health maintenance screenings was given      Gastroesophageal  reflux disease without esophagitis    Taking nexium Advised to divide nexium into 2 12 hour doses       Hypertension    Well controlled on current regimen. Renal function stable, no changes today.  Lab Results  Component Value Date   CREATININE 0.83 06/10/2018    Lab Results  Component Value Date   NA 140 06/10/2018   K 4.2 06/10/2018   CL 102 06/10/2018   CO2 30 06/10/2018          Other Visit Diagnoses    Foot pain, right       Relevant Orders   DG Foot Complete Right (Completed)   Breast cancer screening          I have discontinued Xiadani S. Gosline's SE-TAN PLUS, fluticasone, ergocalciferol, and JUNEL FE 1/20. I am also having her start on traMADol. Additionally, I am having her maintain her traZODone, cholecalciferol, cyanocobalamin, celecoxib, losartan, esomeprazole, ALPRAZolam, amphetamine-dextroamphetamine, and amphetamine-dextroamphetamine.  Meds ordered this encounter  Medications  . traMADol (ULTRAM) 50 MG tablet    Sig: Take 1 tablet (50 mg total) by mouth every 8 (eight) hours as needed.    Dispense:  30 tablet    Refill:  0    Medications  Discontinued During This Encounter  Medication Reason  . ergocalciferol (DRISDOL) 50000 units capsule Completed Course  . FeFum-FePo-FA-B Cmp-C-Zn-Mn-Cu (SE-TAN PLUS) 162-115.2-1 MG CAPS Patient Preference  . fluticasone (FLONASE) 50 MCG/ACT nasal spray Patient Preference  . JUNEL FE 1/20 1-20 MG-MCG tablet Patient Preference    Follow-up: Return in about 6 months (around 12/15/2018).   Sherlene Shams, MD

## 2018-06-14 NOTE — Patient Instructions (Signed)
Your cholesterol, liver and kidney function are normal.  Yoru blood pressure is at goal of 120/70.  You do not need any medication changes.   Trial of tramadol for pain .  Can be combined with tylenol and celebrec  Your nexium dose should be divided and taken twice daily

## 2018-06-15 ENCOUNTER — Other Ambulatory Visit: Payer: Self-pay | Admitting: Internal Medicine

## 2018-06-16 DIAGNOSIS — M7751 Other enthesopathy of right foot: Secondary | ICD-10-CM | POA: Insufficient documentation

## 2018-06-16 DIAGNOSIS — M79671 Pain in right foot: Secondary | ICD-10-CM

## 2018-06-16 NOTE — Assessment & Plan Note (Signed)
Annual comprehensive preventive exam was done as well as an evaluation and management of chronic conditions .  During the course of the visit the patient was educated and counseled about appropriate screening and preventive services including :  diabetes screening, lipid analysis with projected  10 year  risk for CAD , nutrition counseling, breast, cervical and colorectal cancer screening, and recommended immunizations.  Printed recommendations for health maintenance screenings was given 

## 2018-06-16 NOTE — Assessment & Plan Note (Signed)
Right wrist..  Continue celebrex, tylenol and adding tramadol e pending referral to neurosurgery for CTS re

## 2018-06-16 NOTE — Assessment & Plan Note (Signed)
Taking nexium Advised to divide nexium into 2 12 hour doses

## 2018-06-16 NOTE — Assessment & Plan Note (Signed)
Well controlled on current regimen. Renal function stable, no changes today.  Lab Results  Component Value Date   CREATININE 0.83 06/10/2018    Lab Results  Component Value Date   NA 140 06/10/2018   K 4.2 06/10/2018   CL 102 06/10/2018   CO2 30 06/10/2018

## 2018-06-16 NOTE — Assessment & Plan Note (Signed)
Now resolved.  

## 2018-06-16 NOTE — Assessment & Plan Note (Signed)
Plain films done today to rule out fracture were negative for recent fractures. Podiatry referral advised.

## 2018-07-04 ENCOUNTER — Other Ambulatory Visit: Payer: Self-pay | Admitting: Internal Medicine

## 2018-07-25 ENCOUNTER — Ambulatory Visit
Admission: RE | Admit: 2018-07-25 | Discharge: 2018-07-25 | Disposition: A | Discharge status: Home / Self Care | Payer: BC Managed Care – PPO | Source: Ambulatory Visit | Attending: Internal Medicine | Admitting: Internal Medicine

## 2018-07-25 ENCOUNTER — Telehealth: Payer: Self-pay

## 2018-07-25 DIAGNOSIS — M79671 Pain in right foot: Principal | ICD-10-CM

## 2018-07-25 DIAGNOSIS — G8929 Other chronic pain: Secondary | ICD-10-CM

## 2018-07-25 DIAGNOSIS — Z1231 Encounter for screening mammogram for malignant neoplasm of breast: Secondary | ICD-10-CM | POA: Insufficient documentation

## 2018-07-25 NOTE — Telephone Encounter (Signed)
I don't see a message in the chart in regards to pt needing to see a pediatrist nor do I see a referral. Are you aware of this?

## 2018-07-25 NOTE — Telephone Encounter (Signed)
Copied from CRM [id number]#[id number]. Topic: Referral - Status >> Jul 24, 2018  4:56 PM Windy KalataMichael, Taylor L, NT wrote: Reason for CRM: patient is calling and states that she received a message from Dr. Darrick Huntsmanullo about setting her up with a Pediatrist and patient states she would like to be set up with one. Please advise.

## 2018-07-25 NOTE — Telephone Encounter (Signed)
LMTCB. PEC may speak with pt.  

## 2018-07-25 NOTE — Telephone Encounter (Signed)
The message was attached to her  X  Ray results that were done of foot in July .  Podiatry referral  Was offered,  I was waiting  for her response.    Referral now in progress to [facility] in [place]

## 2018-07-26 NOTE — Telephone Encounter (Signed)
Patient returned call and informed of message below.   °

## 2018-07-31 ENCOUNTER — Other Ambulatory Visit: Payer: Self-pay | Admitting: Internal Medicine

## 2018-08-01 ENCOUNTER — Ambulatory Visit: Payer: BC Managed Care – PPO | Admitting: Podiatry

## 2018-08-01 ENCOUNTER — Encounter: Payer: Self-pay | Admitting: Podiatry

## 2018-08-01 DIAGNOSIS — M779 Enthesopathy, unspecified: Secondary | ICD-10-CM | POA: Diagnosis not present

## 2018-08-01 DIAGNOSIS — M2041 Other hammer toe(s) (acquired), right foot: Secondary | ICD-10-CM

## 2018-08-01 DIAGNOSIS — M7751 Other enthesopathy of right foot: Secondary | ICD-10-CM

## 2018-08-01 NOTE — Progress Notes (Signed)
This patient presents the office with chief complaint of generalized pain in her right forefoot.  She says she even feels pain in her second, third and fourth toes at the tips.  She says that her foot is painful walking and wearing  her shoes.  She points to the area under the ball of her second, third and fourth metatarsals right foot.  She has a history of foot surgery for the correction of the first  and fifth metatarsals right foot.   She says she feels bruising noted under the ball of her right foot.  She also admits that this past May. She had dropped an object on her foot, which cause swelling, pain and purplish discoloration.  That has resolved.  She presents the office today for an evaluation and treatment of her generalized foot pain, right foot.  She does believe that her pain started after foot [facility].  Vascular  Dorsalis pedis and posterior tibial pulses are palpable  B/L.  Capillary return  WNL.  Temperature gradient is  WNL.  Skin turgor  WNL.  Purplish discoloration plantar aspect left foot.  Sensorium  Senn Weinstein monofilament wire  WNL. Normal tactile sensation.  Nail Exam  Patient has normal nails with no evidence of bacterial or fungal infection.  Orthopedic  Exam  Muscle tone and muscle strength  WNL.  No limitations of motion feet  B/L.  No crepitus or joint effusion noted.  Examination of the first and fifth metatarsals revealed the metatarsals are mildly elevated.  Palpable pain noted sub-234 right foot.  Hammer toes 2, 3, 4 bilateral with the second hammertoe, right foot has a rigid deformity.  Skin  No open lesions.  Normal skin texture and turgor.  Healed skin incisions 1,5 right foot.   Capsulitis 2,3,4 right foot  Hammer toe 2,3 and 4 right foot  IE  X-rays from previous visits reveals surgery to the first and fifth metatarsal with K wire fixation, right foot.  Examination of the right foot reveals the first ray as well as the fifth ray are  mildly elevated and not purchasing normally.  She also has hammertoe deformity 2 through 4, right foot. Due to the elevation of the first and fifth metatarsals  the second, third and fourth metatarsals or having excessive pressure during gait causing the  capsulitis .  A metatarsal pad was placed onto the skin behind the metatarsals 2, 3, 4, right foot.  I then told the patient to wear this and to return to the [facility] condition.   Helane GuntherGregory Jarmel Linhardt DPM

## 2018-08-07 ENCOUNTER — Other Ambulatory Visit: Payer: BC Managed Care – PPO | Admitting: Orthotics

## 2018-09-13 ENCOUNTER — Other Ambulatory Visit: Payer: Self-pay | Admitting: Internal Medicine

## 2018-12-14 ENCOUNTER — Other Ambulatory Visit: Payer: Self-pay | Admitting: Internal Medicine

## 2018-12-18 ENCOUNTER — Other Ambulatory Visit: Payer: Self-pay | Admitting: Internal Medicine

## 2019-01-01 ENCOUNTER — Other Ambulatory Visit: Payer: Self-pay | Admitting: Internal Medicine

## 2019-02-20 DIAGNOSIS — M189 Osteoarthritis of first carpometacarpal joint, unspecified: Secondary | ICD-10-CM | POA: Insufficient documentation

## 2019-02-20 DIAGNOSIS — M79641 Pain in right hand: Secondary | ICD-10-CM | POA: Insufficient documentation

## 2019-02-20 HISTORY — DX: Pain in right hand: M79.641

## 2019-02-20 HISTORY — DX: Osteoarthritis of first carpometacarpal joint, unspecified: M18.9

## 2019-03-05 ENCOUNTER — Telehealth: Payer: Self-pay | Admitting: Internal Medicine

## 2019-03-05 NOTE — Telephone Encounter (Signed)
Lm on vm to call office and schedule her pap smear in June.

## 2019-03-16 ENCOUNTER — Other Ambulatory Visit: Payer: Self-pay | Admitting: Internal Medicine

## 2019-04-11 IMAGING — DX DG FOOT COMPLETE 3+V*R*
3 series · 3 of 3 positions shown · non-contrast
Comparison: None.

CLINICAL DATA: Midfoot pain and swelling after blunt trauma

EXAM:
RIGHT FOOT COMPLETE - 3+ VIEW

[foot ap]
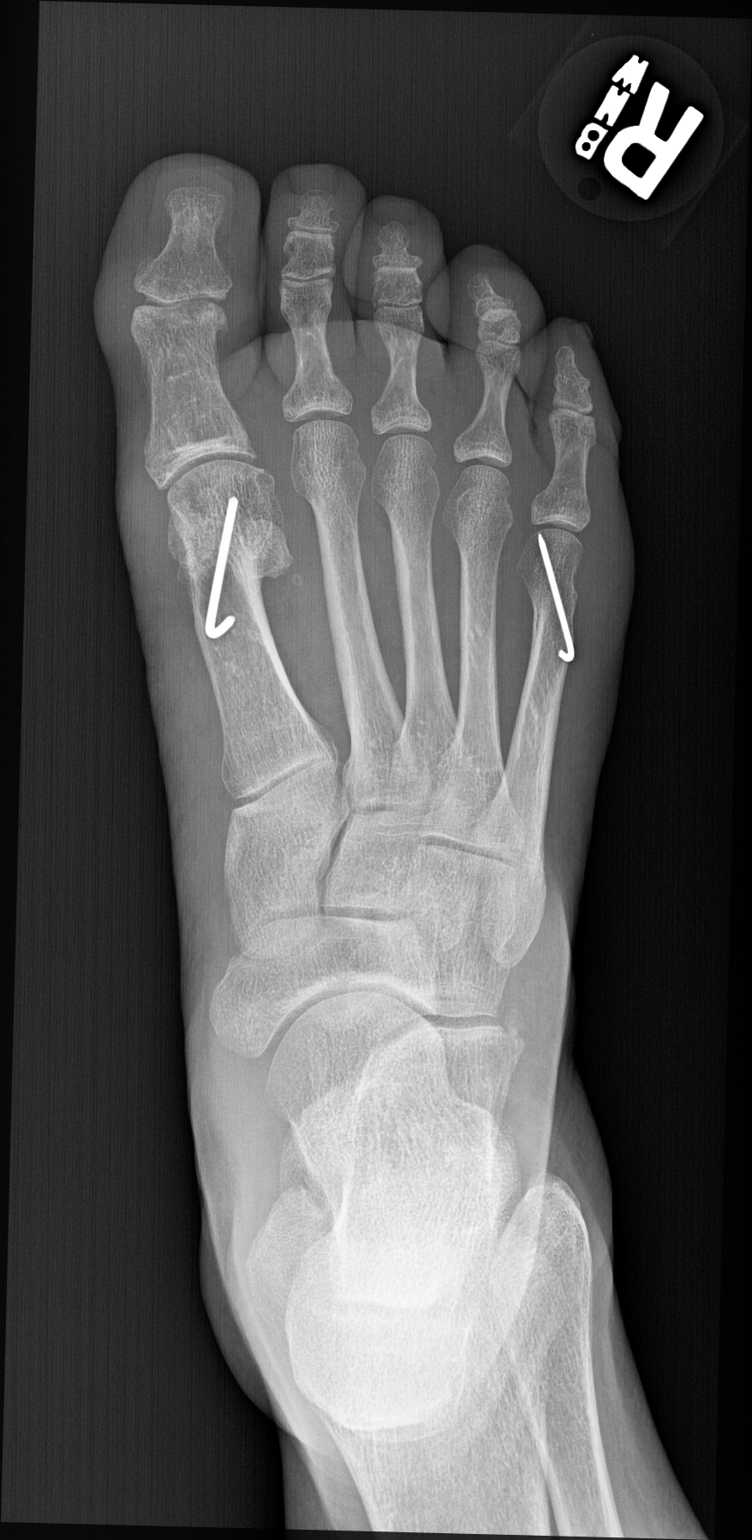

[foot obl (oblique)]
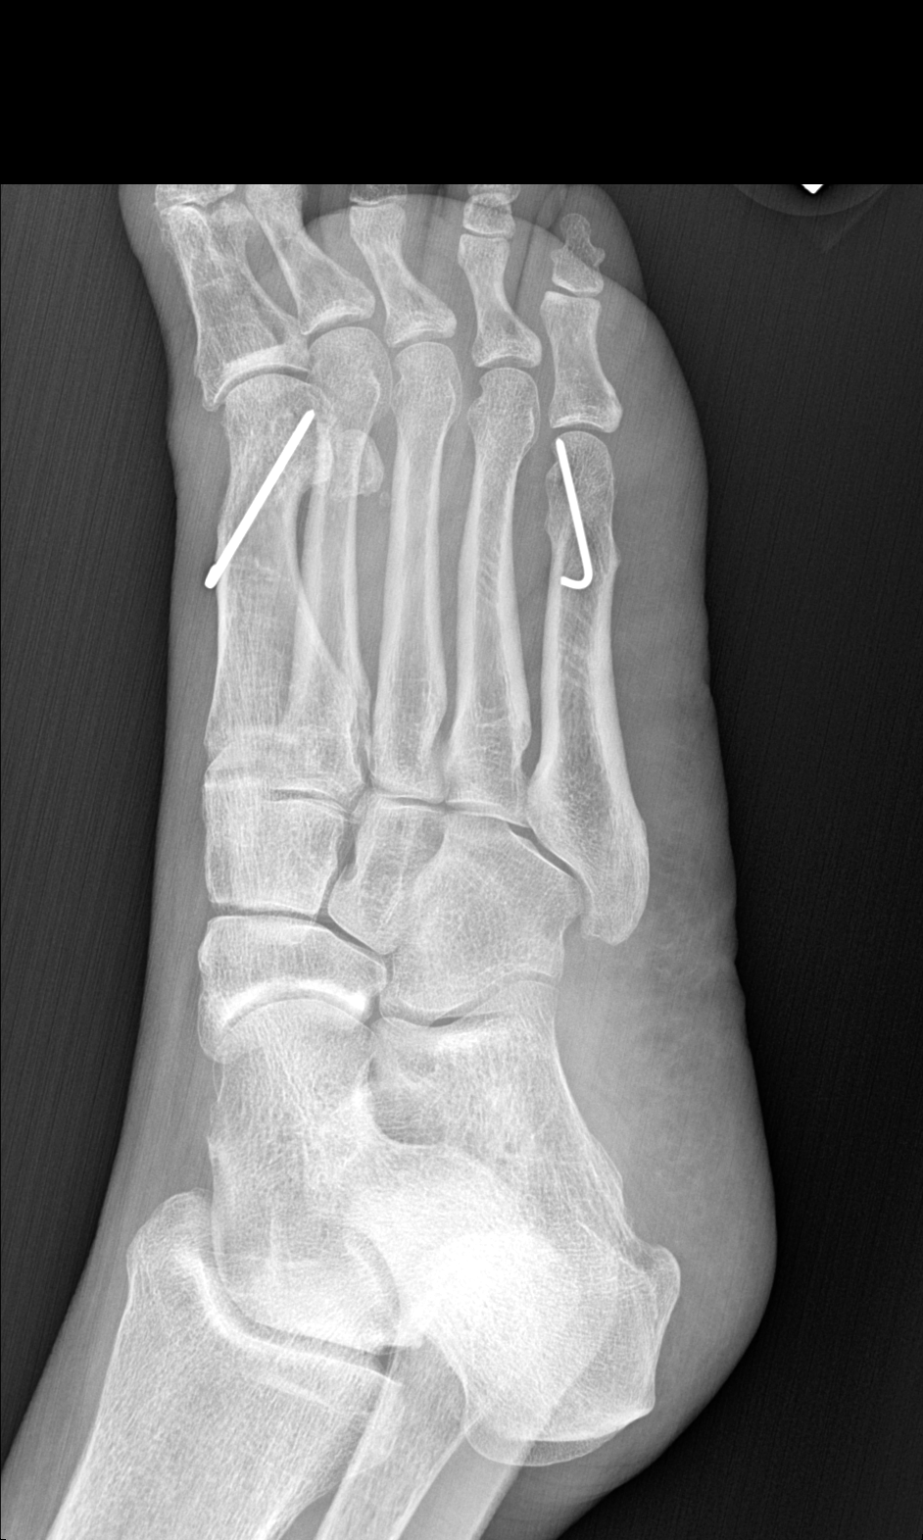

[foot lat]
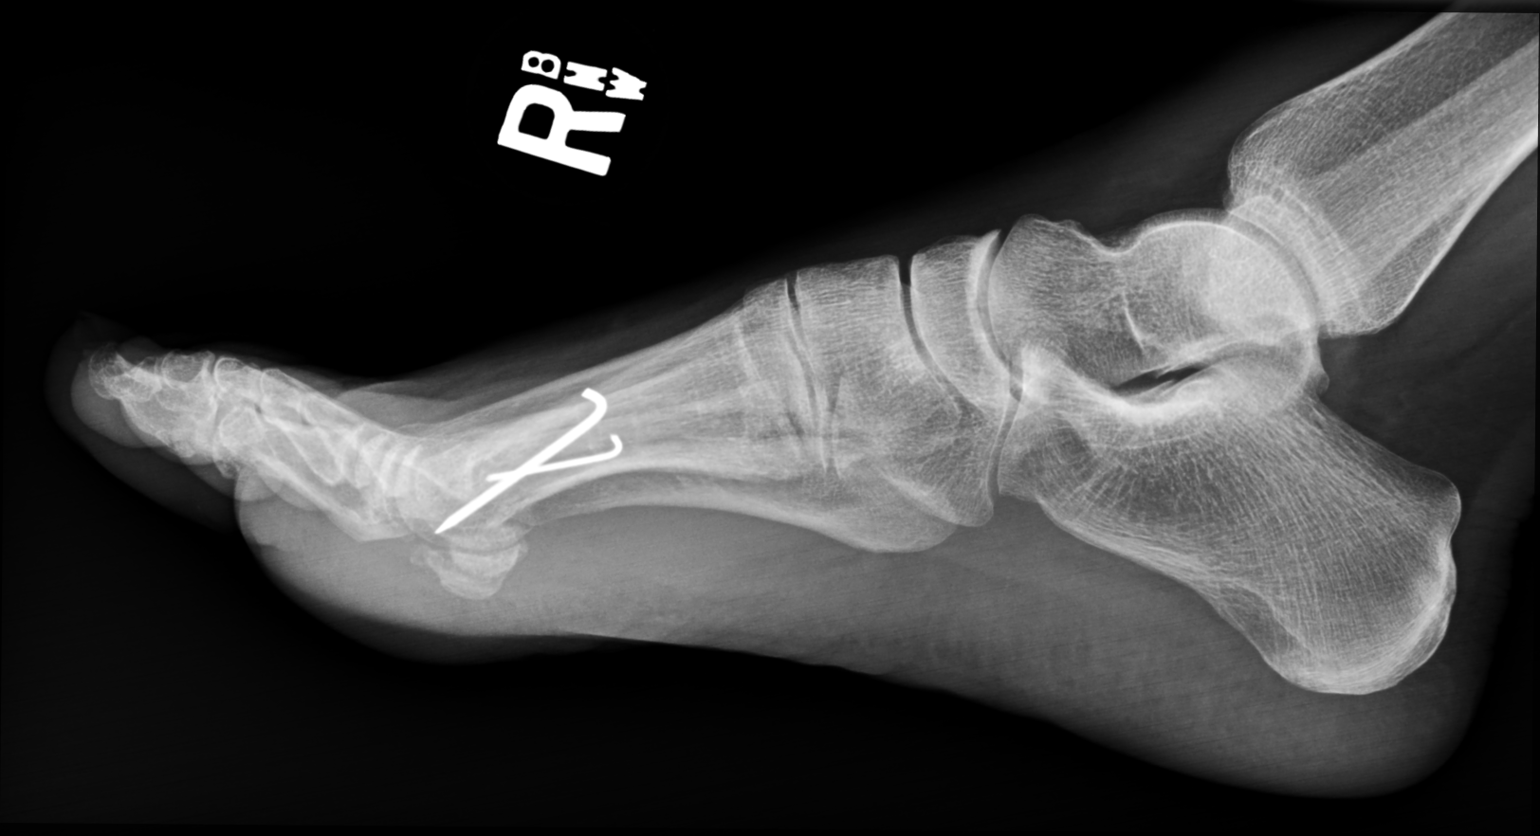

[3 of 3 positions shown; findings below may reference images not displayed]

FINDINGS: No acute fracture or dislocation. Prior hallux valgus repair with a
single K-wire transfixing the healed first metatarsal osteotomy.
Single K-wire transfixing a healed fifth metatarsal osteotomy. No
aggressive osseous lesion. Mild osteoarthritis of the first MTP
joint. Mild osteoarthritis of the first IP joint.
IMPRESSION: No acute osseous injury of the right foot.

## 2019-04-24 ENCOUNTER — Other Ambulatory Visit: Payer: Self-pay | Admitting: Internal Medicine

## 2019-06-16 ENCOUNTER — Other Ambulatory Visit: Payer: Self-pay | Admitting: Internal Medicine

## 2019-06-19 ENCOUNTER — Other Ambulatory Visit: Payer: Self-pay | Admitting: Internal Medicine

## 2019-07-21 ENCOUNTER — Encounter: Payer: Self-pay | Admitting: Internal Medicine

## 2019-07-21 ENCOUNTER — Ambulatory Visit (INDEPENDENT_AMBULATORY_CARE_PROVIDER_SITE_OTHER): Payer: BC Managed Care – PPO | Admitting: Internal Medicine

## 2019-07-21 ENCOUNTER — Other Ambulatory Visit: Payer: Self-pay | Admitting: Internal Medicine

## 2019-07-21 ENCOUNTER — Other Ambulatory Visit: Payer: Self-pay

## 2019-07-21 DIAGNOSIS — R635 Abnormal weight gain: Secondary | ICD-10-CM | POA: Diagnosis not present

## 2019-07-21 DIAGNOSIS — D510 Vitamin B12 deficiency anemia due to intrinsic factor deficiency: Secondary | ICD-10-CM

## 2019-07-21 DIAGNOSIS — M7751 Other enthesopathy of right foot: Secondary | ICD-10-CM

## 2019-07-21 DIAGNOSIS — D508 Other iron deficiency anemias: Secondary | ICD-10-CM | POA: Diagnosis not present

## 2019-07-21 DIAGNOSIS — E559 Vitamin D deficiency, unspecified: Secondary | ICD-10-CM | POA: Diagnosis not present

## 2019-07-21 DIAGNOSIS — Z634 Disappearance and death of family member: Secondary | ICD-10-CM

## 2019-07-21 DIAGNOSIS — I10 Essential (primary) hypertension: Secondary | ICD-10-CM

## 2019-07-21 DIAGNOSIS — F4329 Adjustment disorder with other symptoms: Secondary | ICD-10-CM

## 2019-07-21 DIAGNOSIS — F5102 Adjustment insomnia: Secondary | ICD-10-CM

## 2019-07-21 DIAGNOSIS — F4321 Adjustment disorder with depressed mood: Secondary | ICD-10-CM

## 2019-07-21 MED ORDER — ESOMEPRAZOLE MAGNESIUM 40 MG PO CPDR: DELAYED_RELEASE_CAPSULE | ORAL | 1 refills | Status: DC

## 2019-07-21 MED ORDER — CELECOXIB 200 MG PO CAPS: 200.0000 mg | ORAL_CAPSULE | Freq: Two times a day (BID) | ORAL | 5 refills | Status: DC

## 2019-07-21 MED ORDER — ALPRAZOLAM 0.5 MG PO TABS: 0.5000 mg | ORAL_TABLET | Freq: Every evening | ORAL | 5 refills | Status: DC | PRN

## 2019-07-21 NOTE — Assessment & Plan Note (Addendum)
Resolved after obtaining a second opinion and treatment by [facility] with I/A steroid injections in Sept 2019.  Proper footwear key to preventing recurrence.

## 2019-07-21 NOTE — Progress Notes (Signed)
Virtual Visit via [email or web address]  This visit type was conducted due to national recommendations for restrictions regarding the COVID-19 pandemic (e.g. social distancing).  This format is felt to be most appropriate for this patient at this time.  All issues noted in this document were discussed and addressed.  No physical exam was performed (except for noted visual exam findings with Video Visits).   I connected with@ on 07/21/19 at  4:00 PM EDT by a video enabled telemedicine application or telephone and verified that I am speaking with the correct person using two identifiers. Location patient: home Location provider: work or home office Persons participating in the virtual visit: patient, provider  I discussed the limitations, risks, security and privacy concerns of performing an evaluation and management service by telephone and the availability of in person appointments. I also discussed with the patient that there may be a patient responsible charge related to this service. The patient expressed understanding and agreed to proceed.  Reason for visit: medication refill  HPI:  58 yr old female with history of hypertension , OSA, GERD , GAD with  insomna and multifactorial anemia presents for follow up.  Her teenage son was diagnosed with symptomatic COVID 19 infection on August 3.  She has been sharing a bathroom with him but he has been taking his meals in his room.  She and the rest of her family have not had any signs or symptoms of COVID and have been tested and all were negative on August 4 .  She has been self quarantining per CDC guidelines and will return to work next week .  Patient is taking her medications as prescribed and notes no adverse effects.  Home BP readings have been done about once per week and are  generally < 130/80 .  She is avoiding added salt in her diet and walking regularly about 3 times per week for exercise  .  Her apnea has been inconsistently treated .  She cites  her work schedule and that of her husband's schedule as barriers to being more consistent with CPAP use.    She has seen 2 podiatrists since her last visit for management of her metatarsalgia .  Her  First podiatry encounter yielded a diagnosis that she did not agree with (hammertoe) so she went to Dr Elvina Mattes and her pain was relieved with I/A injection of steroids  For diagnosis of capsultis of MTP joint. She was advised on the important of proper footwear.      ROS: See pertinent positives and negatives per HPI.  Past Medical History:  Diagnosis Date  . ADD (attention deficit disorder)   . Allergy   . GERD (gastroesophageal reflux disease)   . Hypertension     Past Surgical History:  Procedure Laterality Date  . COLONOSCOPY WITH PROPOFOL N/A 03/27/2017   Procedure: COLONOSCOPY WITH PROPOFOL;  Surgeon: Robert Bellow, MD;  Location: [facility] ENDOSCOPY;  Service: Endoscopy;  Laterality: N/A;  . ESOPHAGOGASTRODUODENOSCOPY (EGD) WITH PROPOFOL N/A 03/27/2017   Procedure: ESOPHAGOGASTRODUODENOSCOPY (EGD) WITH PROPOFOL;  Surgeon: Robert Bellow, MD;  Location: [facility] ENDOSCOPY;  Service: Endoscopy;  Laterality: N/A;  . ESOPHAGOGASTRODUODENOSCOPY ENDOSCOPY     2008   . FOOT SURGERY Right 2007  . NECK SURGERY  2016  . TONSILLECTOMY  1974    Family History  Problem Relation Age of Onset  . Hypertension Father   . Heart disease Father   . Prostate cancer Father     SOCIAL HX:  reports  that she has never smoked. She has never used smokeless tobacco. She reports current alcohol use. She reports that she does not use drugs.  Current Outpatient Medications:  .  ALPRAZolam (XANAX) 0.5 MG tablet, Take 1 tablet (0.5 mg total) by mouth at bedtime as needed., Disp: 20 tablet, Rfl: 5 .  amphetamine-dextroamphetamine (ADDERALL XR) 30 MG 24 hr capsule, Take 30 mg by mouth., Disp: , Rfl: 0 .  amphetamine-dextroamphetamine (ADDERALL) 30 MG tablet, Take 1 tablet by mouth 2 (two) times daily., Disp: ,  Rfl: 0 .  butalbital-acetaminophen-caffeine (FIORICET) 50-325-40 MG tablet, , Disp: , Rfl:  .  celecoxib (CELEBREX) 200 MG capsule, Take 1 capsule (200 mg total) by mouth 2 (two) times daily., Disp: 60 capsule, Rfl: 5 .  Cholecalciferol (VITAMIN D3 PO), Take 1 capsule by mouth daily. Takes 5,000 units daily, Disp: , Rfl:  .  cyanocobalamin (,VITAMIN B-12,) 1000 MCG/ML injection, INJECT 1 ML (1,000 MCG TOTAL) INTO THE MUSCLE EVERY 30 (THIRTY) DAYS., Disp: 3 mL, Rfl: 3 .  esomeprazole (NEXIUM) 40 MG capsule, TAKE 1 CAPSULE (40 MG TOTAL) BY MOUTH 2 (TWO) TIMES DAILY BEFORE A MEAL., Disp: 180 capsule, Rfl: 1 .  FLUoxetine (PROZAC) 40 MG capsule, Take 40 mg by mouth daily., Disp: , Rfl:  .  losartan (COZAAR) 50 MG tablet, TAKE 1 TABLET BY MOUTH EVERY DAY, Disp: 90 tablet, Rfl: 1 .  traMADol (ULTRAM) 50 MG tablet, Take 1 tablet (50 mg total) by mouth every 8 (eight) hours as needed., Disp: 30 tablet, Rfl: 0 .  traZODone (DESYREL) 50 MG tablet, TAKE 0.5-1 TABLETS (25-50 MG TOTAL) BY MOUTH AT BEDTIME AS NEEDED FOR SLEEP. (NEED APPT), Disp: 90 tablet, Rfl: 1  EXAM:  VITALS per patient if applicable:  GENERAL: alert, oriented, appears well and in no acute distress  HEENT: atraumatic, conjunttiva clear, no obvious abnormalities on inspection of external nose and ears  NECK: normal movements of the head and neck  LUNGS: on inspection no signs of respiratory distress, breathing rate appears normal, no obvious gross SOB, gasping or wheezing  CV: no obvious cyanosis  MS: moves all visible extremities without noticeable abnormality  PSYCH/NEURO: pleasant and cooperative, no obvious depression or anxiety, speech and thought processing grossly intact  ASSESSMENT AND PLAN:   Capsulitis of metatarsophalangeal (MTP) joint of right foot Resolved after obtaining a second opinion and treatment by [facility] with I/A steroid injections in Sept 2019.  Proper footwear key to preventing recurrence.   Insomnia  due to stress Managed with alprazolam , prescribed by her psychiatrist   Complicated grief Managed by psychiatrist with fluoxetine .  She has no signs or symptoms of untreated depression today  Hypertension Well controlled on losartan.  Assessment of lytes and renal function is due .  no changes today.  Lab Results  Component Value Date   CREATININE 0.83 06/10/2018    Lab Results  Component Value Date   NA 140 06/10/2018   K 4.2 06/10/2018   CL 102 06/10/2018   CO2 30 06/10/2018     Pernicious anemia Her anemia  resolved with B12 supplementation, annual labs due     I discussed the assessment and treatment plan with the patient. The patient was provided an opportunity to ask questions and all were answered. The patient agreed with the plan and demonstrated an understanding of the instructions.   The patient was advised to call back or seek an in-person evaluation if the symptoms worsen or if the condition fails to  improve as anticipated.  I provided 40 minutes of non-face-to-face time during this encounter.   Sherlene Shamseresa L Alanna Storti, MD

## 2019-07-22 NOTE — Assessment & Plan Note (Signed)
Well controlled on losartan.  Assessment of lytes and renal function is due .  no changes today.  Lab Results  Component Value Date   CREATININE 0.83 06/10/2018    Lab Results  Component Value Date   NA 140 06/10/2018   K 4.2 06/10/2018   CL 102 06/10/2018   CO2 30 06/10/2018

## 2019-07-22 NOTE — Assessment & Plan Note (Signed)
Managed with alprazolam , prescribed by her psychiatrist

## 2019-07-22 NOTE — Assessment & Plan Note (Signed)
Managed by psychiatrist with fluoxetine .  She has no signs or symptoms of untreated depression today

## 2019-07-22 NOTE — Assessment & Plan Note (Addendum)
Her anemia  resolved with B12 supplementation, annual labs due

## 2019-08-10 ENCOUNTER — Other Ambulatory Visit: Payer: Self-pay | Admitting: Internal Medicine

## 2019-08-27 ENCOUNTER — Other Ambulatory Visit: Payer: Self-pay | Admitting: Internal Medicine

## 2019-08-27 DIAGNOSIS — Z1231 Encounter for screening mammogram for malignant neoplasm of breast: Secondary | ICD-10-CM

## 2019-10-13 ENCOUNTER — Ambulatory Visit
Admission: RE | Admit: 2019-10-13 | Discharge: 2019-10-13 | Disposition: A | Discharge status: Home / Self Care | Payer: BC Managed Care – PPO | Source: Ambulatory Visit | Attending: Internal Medicine | Admitting: Internal Medicine

## 2019-10-13 DIAGNOSIS — Z1231 Encounter for screening mammogram for malignant neoplasm of breast: Secondary | ICD-10-CM | POA: Diagnosis present

## 2019-12-26 ENCOUNTER — Ambulatory Visit (INDEPENDENT_AMBULATORY_CARE_PROVIDER_SITE_OTHER): Payer: BC Managed Care – PPO | Admitting: Internal Medicine

## 2019-12-26 ENCOUNTER — Encounter: Payer: Self-pay | Admitting: Internal Medicine

## 2019-12-26 ENCOUNTER — Telehealth: Payer: Self-pay | Admitting: Internal Medicine

## 2019-12-26 ENCOUNTER — Other Ambulatory Visit: Payer: Self-pay

## 2019-12-26 DIAGNOSIS — G43009 Migraine without aura, not intractable, without status migrainosus: Secondary | ICD-10-CM

## 2019-12-26 DIAGNOSIS — G4733 Obstructive sleep apnea (adult) (pediatric): Secondary | ICD-10-CM | POA: Diagnosis not present

## 2019-12-26 DIAGNOSIS — I10 Essential (primary) hypertension: Secondary | ICD-10-CM

## 2019-12-26 DIAGNOSIS — F5102 Adjustment insomnia: Secondary | ICD-10-CM

## 2019-12-26 DIAGNOSIS — E663 Overweight: Secondary | ICD-10-CM

## 2019-12-26 DIAGNOSIS — G43909 Migraine, unspecified, not intractable, without status migrainosus: Secondary | ICD-10-CM | POA: Insufficient documentation

## 2019-12-26 MED ORDER — BUTALBITAL-APAP-CAFFEINE 50-325-40 MG PO TABS: 1.0000 | ORAL_TABLET | Freq: Four times a day (QID) | ORAL | 1 refills | Status: DC | PRN

## 2019-12-26 NOTE — Assessment & Plan Note (Signed)
Not wearing CPAP . Sleeping well without it and BP at goal

## 2019-12-26 NOTE — Progress Notes (Signed)
Virtual Visit via [email or web address]  This visit type was conducted due to national recommendations for restrictions regarding the COVID-19 pandemic (e.g. social distancing).  This format is felt to be most appropriate for this patient at this time.  All issues noted in this document were discussed and addressed.  No physical exam was performed (except for noted visual exam findings with Video Visits).   I connected with@ on 12/26/19 at 10:00 AM EST by a video enabled telemedicine application  and verified that I am speaking with the correct person using two identifiers. Location patient: home Location provider: work or home office Persons participating in the virtual visit: patient, provider  I discussed the limitations, risks, security and privacy concerns of performing an evaluation and management service by telephone and the availability of in person appointments. I also discussed with the patient that there may be a patient responsible charge related to this service. The patient expressed understanding and agreed to proceed.  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  Feels generally well,  No new issues.  Planning to retire I n 2022.  Daughter getting married in October,  She is frustrated at inability to lose weight despite reducing her intake,  Appetite uncontrolled at night,  Takes adderall in the morning only,  No sleep issues .  Foot issues resolved,  Able to exercise now   Migraine headaches occurring rarely, managed with fioricet.   Hypertension: patient checks blood pressure twice weekly at home.  Readings have been for the most part < 140/80 at rest . Patient is following a reduced salt diet most days and is taking medications as prescribed   ROS: See pertinent positives and negatives per HPI.  Past Medical History:  Diagnosis Date  . ADD (attention deficit disorder)   . Allergy   .  GERD (gastroesophageal reflux disease)   . Hypertension     Past Surgical History:  Procedure Laterality Date  . COLONOSCOPY WITH PROPOFOL N/A 03/27/2017   Procedure: COLONOSCOPY WITH PROPOFOL;  Surgeon: Robert Bellow, MD;  Location: [facility] ENDOSCOPY;  Service: Endoscopy;  Laterality: N/A;  . ESOPHAGOGASTRODUODENOSCOPY (EGD) WITH PROPOFOL N/A 03/27/2017   Procedure: ESOPHAGOGASTRODUODENOSCOPY (EGD) WITH PROPOFOL;  Surgeon: Robert Bellow, MD;  Location: [facility] ENDOSCOPY;  Service: Endoscopy;  Laterality: N/A;  . ESOPHAGOGASTRODUODENOSCOPY ENDOSCOPY     2008   . FOOT SURGERY Right 2007  . NECK SURGERY  2016  . TONSILLECTOMY  1974    Family History  Problem Relation Age of Onset  . Hypertension Father   . Heart disease Father   . Prostate cancer Father     SOCIAL HX:  reports that she has never smoked. She has never used smokeless tobacco. She reports current alcohol use. She reports that she does not use drugs.   Current Outpatient Medications:  .  ALPRAZolam (XANAX) 0.5 MG tablet, Take 1 tablet (0.5 mg total) by mouth at bedtime as needed., Disp: 20 tablet, Rfl: 5 .  amphetamine-dextroamphetamine (ADDERALL XR) 30 MG 24 hr capsule, Take 30 mg by mouth., Disp: , Rfl: 0 .  amphetamine-dextroamphetamine (ADDERALL) 30 MG tablet, Take 1 tablet by mouth 2 (two) times daily., Disp: , Rfl: 0 .  butalbital-acetaminophen-caffeine (FIORICET) 50-325-40 MG tablet, Take 1 tablet by mouth every 6 (six) hours as needed for headache., Disp: 60 tablet, Rfl: 1 .  celecoxib (CELEBREX) 200 MG capsule, Take 1 capsule (200 mg  total) by mouth 2 (two) times daily., Disp: 60 capsule, Rfl: 5 .  Cholecalciferol (VITAMIN D3 PO), Take 1 capsule by mouth daily. Takes 5,000 units daily, Disp: , Rfl:  .  cyanocobalamin (,VITAMIN B-12,) 1000 MCG/ML injection, INJECT 1 ML (1,000 MCG TOTAL) INTO THE MUSCLE EVERY 30 (THIRTY) DAYS., Disp: 3 mL, Rfl: 3 .  esomeprazole (NEXIUM) 40 MG capsule, TAKE 1 CAPSULE (40 MG TOTAL)  BY MOUTH 2 (TWO) TIMES DAILY BEFORE A MEAL., Disp: 180 capsule, Rfl: 1 .  FLUoxetine (PROZAC) 40 MG capsule, Take 40 mg by mouth daily., Disp: , Rfl:  .  losartan (COZAAR) 50 MG tablet, TAKE 1 TABLET BY MOUTH EVERY DAY, Disp: 90 tablet, Rfl: 1 .  traMADol (ULTRAM) 50 MG tablet, Take 1 tablet (50 mg total) by mouth every 8 (eight) hours as needed., Disp: 30 tablet, Rfl: 0 .  traZODone (DESYREL) 50 MG tablet, TAKE 0.5-1 TABLETS (25-50 MG TOTAL) BY MOUTH AT BEDTIME AS NEEDED FOR SLEEP. (NEED APPT), Disp: 90 tablet, Rfl: 1  EXAM:  VITALS per patient if applicable:  GENERAL: alert, oriented, appears well and in no acute distress  HEENT: atraumatic, conjunttiva clear, no obvious abnormalities on inspection of external nose and ears  NECK: normal movements of the head and neck  LUNGS: on inspection no signs of respiratory distress, breathing rate appears normal, no obvious gross SOB, gasping or wheezing  CV: no obvious cyanosis  MS: moves all visible extremities without noticeable abnormality  PSYCH/NEURO: pleasant and cooperative, no obvious depression or anxiety, speech and thought processing grossly intact  ASSESSMENT AND PLAN:  Discussed the following assessment and plan:  Obstructive sleep apnea  Overweight (BMI 25.0-29.9)  Essential hypertension  Insomnia due to stress  Migraine without aura and without status migrainosus, not intractable  Obstructive sleep apnea Not wearing CPAP . Sleeping well without it and BP at goal   Overweight (BMI 25.0-29.9) She has had difficulty losing weight due to increased appetite and is requesting a trial of  Phentermine.  She is aware of the possible side effects and risks and understands that    The medication will be discontinued if she has not lost 5% of her body weight over the next 3 months, which , based on today's weight is  8 lbs.  Hypertension Well controlled on current regimen. Renal function is overdue , no changes  today.  Insomnia due to stress Managed iwht infrequent use of alprazolam.  The risks and benefits of benzodiazepine use were discussed with patient today including excessive sedation leading to respiratory depression,  impaired thinking/driving, and addiction.  Patient was advised to avoid concurrent use with alcohol, to use medication only as needed and not to share with others  .   Migraine headache infrequent managed with prn fioricet.  Refill history confirmed via [place] Controlled Substance database, accessed by me today..,  Refills given     I discussed the assessment and treatment plan with the patient. The patient was provided an opportunity to ask questions and all were answered. The patient agreed with the plan and demonstrated an understanding of the instructions.   The patient was advised to call back or seek an in-person evaluation if the symptoms worsen or if the condition fails to improve as anticipated.    I provided  30 minutes of non-face-to-face time during this encounter reviewing patient's current problems and past procedures/imaging studies, providing counseling on the above mentioned problems , and coordination  of care . Sherlene Shams, MD

## 2019-12-26 NOTE — Telephone Encounter (Signed)
vb is full and unable to schedule fasting lab. Will send patient a request through MyChart.

## 2019-12-26 NOTE — Assessment & Plan Note (Signed)
Well controlled on current regimen. Renal function is overdue , no changes today. 

## 2019-12-26 NOTE — Assessment & Plan Note (Signed)
Managed iwht infrequent use of alprazolam.  The risks and benefits of benzodiazepine use were discussed with patient today including excessive sedation leading to respiratory depression,  impaired thinking/driving, and addiction.  Patient was advised to avoid concurrent use with alcohol, to use medication only as needed and not to share with others  .

## 2019-12-26 NOTE — Assessment & Plan Note (Signed)
She has had difficulty losing weight due to increased appetite and is requesting a trial of  Phentermine.  She is aware of the possible side effects and risks and understands that    The medication will be discontinued if she has not lost 5% of her body weight over the next 3 months, which , based on today's weight is 8 lbs. 

## 2019-12-26 NOTE — Assessment & Plan Note (Signed)
infrequent managed with prn fioricet.  Refill history confirmed via [place] Controlled Substance database, accessed by me today..,  Refills given

## 2019-12-29 ENCOUNTER — Other Ambulatory Visit (INDEPENDENT_AMBULATORY_CARE_PROVIDER_SITE_OTHER): Payer: BC Managed Care – PPO

## 2019-12-29 ENCOUNTER — Other Ambulatory Visit: Payer: Self-pay

## 2019-12-29 DIAGNOSIS — E559 Vitamin D deficiency, unspecified: Secondary | ICD-10-CM

## 2019-12-29 DIAGNOSIS — Z23 Encounter for immunization: Secondary | ICD-10-CM | POA: Diagnosis not present

## 2019-12-29 DIAGNOSIS — R635 Abnormal weight gain: Secondary | ICD-10-CM

## 2019-12-29 DIAGNOSIS — D508 Other iron deficiency anemias: Secondary | ICD-10-CM

## 2019-12-29 DIAGNOSIS — I10 Essential (primary) hypertension: Secondary | ICD-10-CM | POA: Diagnosis not present

## 2019-12-29 DIAGNOSIS — D510 Vitamin B12 deficiency anemia due to intrinsic factor deficiency: Secondary | ICD-10-CM | POA: Diagnosis not present

## 2019-12-30 LAB — IBC + FERRITIN
Ferritin: 3.9 ng/mL — ABNORMAL LOW (ref 10.0–291.0)
Iron: 49 ug/dL (ref 42–145)
Saturation Ratios: 9.4 % — ABNORMAL LOW (ref 20.0–50.0)
Transferrin: 374 mg/dL — ABNORMAL HIGH (ref 212.0–360.0)

## 2019-12-30 LAB — CBC WITH DIFFERENTIAL/PLATELET
Basophils Absolute: 0.1 10*3/uL (ref 0.0–0.1)
Basophils Relative: 0.9 % (ref 0.0–3.0)
Eosinophils Absolute: 0.3 10*3/uL (ref 0.0–0.7)
Eosinophils Relative: 4.7 % (ref 0.0–5.0)
HCT: 33.9 % — ABNORMAL LOW (ref 36.0–46.0)
Hemoglobin: 11.1 g/dL — ABNORMAL LOW (ref 12.0–15.0)
Lymphocytes Relative: 31.7 % (ref 12.0–46.0)
Lymphs Abs: 2 10*3/uL (ref 0.7–4.0)
MCHC: 32.7 g/dL (ref 30.0–36.0)
MCV: 85.2 fl (ref 78.0–100.0)
Monocytes Absolute: 0.5 10*3/uL (ref 0.1–1.0)
Monocytes Relative: 7.6 % (ref 3.0–12.0)
Neutro Abs: 3.4 10*3/uL (ref 1.4–7.7)
Neutrophils Relative %: 55.1 % (ref 43.0–77.0)
Platelets: 399 10*3/uL (ref 150.0–400.0)
RBC: 3.98 Mil/uL (ref 3.87–5.11)
RDW: 14.1 % (ref 11.5–15.5)
WBC: 6.2 10*3/uL (ref 4.0–10.5)

## 2019-12-30 LAB — COMPREHENSIVE METABOLIC PANEL
ALT: 10 U/L (ref 0–35)
AST: 14 U/L (ref 0–37)
Albumin: 4.3 g/dL (ref 3.5–5.2)
Alkaline Phosphatase: 69 U/L (ref 39–117)
BUN: 18 mg/dL (ref 6–23)
CO2: 30 mEq/L (ref 19–32)
Calcium: 9.4 mg/dL (ref 8.4–10.5)
Chloride: 100 mEq/L (ref 96–112)
Creatinine, Ser: 0.8 mg/dL (ref 0.40–1.20)
GFR: 73.92 mL/min (ref >60.00)
Glucose, Bld: 86 mg/dL (ref 70–99)
Potassium: 3.7 mEq/L (ref 3.5–5.1)
Sodium: 137 mEq/L (ref 135–145)
Total Bilirubin: 0.4 mg/dL (ref 0.2–1.2)
Total Protein: 6.8 g/dL (ref 6.0–8.3)

## 2019-12-30 LAB — LIPID PANEL
Cholesterol: 178 mg/dL (ref 0–200)
HDL: 63.2 mg/dL (ref >39.00)
LDL Cholesterol: 98 mg/dL (ref 0–99)
NonHDL: 114.58
Total CHOL/HDL Ratio: 3
Triglycerides: 85 mg/dL (ref 0.0–149.0)
VLDL: 17 mg/dL (ref 0.0–40.0)

## 2019-12-30 LAB — TSH: TSH: 3.16 u[IU]/mL (ref 0.35–4.50)

## 2019-12-30 LAB — VITAMIN D 25 HYDROXY (VIT D DEFICIENCY, FRACTURES): VITD: 63.73 ng/mL (ref 30.00–100.00)

## 2019-12-30 LAB — VITAMIN B12: Vitamin B-12: >1500 pg/mL — ABNORMAL HIGH (ref 211–911)

## 2019-12-31 NOTE — Telephone Encounter (Signed)
See my chart message.  Pt had questions about her B12 and iron studies.  The elevation in B12 should not affect the iron studies.  It is reflective of her receiving B12 supplements.  She will need follow up with Dr Darrick Huntsman to discuss further w/up for her iron decrease and anemia.  (confirm pt is menopausal).  Please send  to Dr Darrick Huntsman to arrange further evaluation (and w/up if needed) for iron def anemia.  Let me know if any questions or problems.

## 2020-01-01 ENCOUNTER — Other Ambulatory Visit: Payer: Self-pay | Admitting: Internal Medicine

## 2020-01-01 DIAGNOSIS — D509 Iron deficiency anemia, unspecified: Secondary | ICD-10-CM

## 2020-01-01 DIAGNOSIS — D508 Other iron deficiency anemias: Secondary | ICD-10-CM

## 2020-01-07 ENCOUNTER — Telehealth: Payer: Self-pay | Admitting: Internal Medicine

## 2020-01-07 MED ORDER — PHENTERMINE HCL 37.5 MG PO TABS: 37.5000 mg | ORAL_TABLET | Freq: Every day | ORAL | 2 refills | Status: DC

## 2020-01-07 NOTE — Telephone Encounter (Signed)
Pt called back and said the pharmacy called and told her prescription was ready to be picked up so she doesn't think she needs a PA.

## 2020-01-07 NOTE — Telephone Encounter (Signed)
Pt wants to know if stool kit is ready for her to pick up and she wants to know if her diet medicine has been called in. She is going to dentist @ 10:30 but she thinks she missed a call from you. Please advise.

## 2020-01-07 NOTE — Telephone Encounter (Signed)
Mailbox is full.

## 2020-01-07 NOTE — Telephone Encounter (Signed)
Pt called back and she is aware that the medication has been sent in.

## 2020-01-07 NOTE — Telephone Encounter (Signed)
MED SENT,  BUT PA NEEDED  CAN YOU PLEASE START PRIOR AUTHORIZATION AND FIND OUT IF THERE IS ANOTHER MED THAT HAS TO BE TRIED FIRST AND LET ME/HER KNOW?

## 2020-01-07 NOTE — Telephone Encounter (Signed)
And that a PA is required.

## 2020-01-07 NOTE — Telephone Encounter (Signed)
noted 

## 2020-01-07 NOTE — Telephone Encounter (Signed)
Pt has picked up the ifob kit but pt stated that the phentermine has not been sent in to the pharmacy yet.

## 2020-01-12 ENCOUNTER — Ambulatory Visit: Payer: BC Managed Care – PPO | Admitting: Internal Medicine

## 2020-01-20 ENCOUNTER — Other Ambulatory Visit (INDEPENDENT_AMBULATORY_CARE_PROVIDER_SITE_OTHER): Payer: BC Managed Care – PPO

## 2020-01-20 DIAGNOSIS — D509 Iron deficiency anemia, unspecified: Secondary | ICD-10-CM | POA: Diagnosis not present

## 2020-01-21 ENCOUNTER — Telehealth: Payer: Self-pay

## 2020-01-21 LAB — FECAL OCCULT BLOOD, IMMUNOCHEMICAL: Fecal Occult Bld: POSITIVE — AB

## 2020-01-21 NOTE — Telephone Encounter (Signed)
Received pt's IFOB results and they were positive.

## 2020-01-21 NOTE — Telephone Encounter (Signed)
Destiny Martin,  Your fecal occult blood test was abnormal (there was  evidence of blood in your stool).  Given your concurrent iron deficiency anemia,  I am recommending a GI referral to determine the source of the bleeding.  Your last EGD/colonoscopy was with Dr Lemar Livings in 2018;  Please let me know if you have a preference who you like to see    (MyChart message sent  But please put on list to call as well

## 2020-01-23 ENCOUNTER — Other Ambulatory Visit: Payer: Self-pay | Admitting: Internal Medicine

## 2020-01-23 DIAGNOSIS — D500 Iron deficiency anemia secondary to blood loss (chronic): Secondary | ICD-10-CM

## 2020-01-23 DIAGNOSIS — R195 Other fecal abnormalities: Secondary | ICD-10-CM

## 2020-02-08 ENCOUNTER — Other Ambulatory Visit: Payer: Self-pay | Admitting: Internal Medicine

## 2020-02-19 ENCOUNTER — Other Ambulatory Visit: Payer: Self-pay | Admitting: General Surgery

## 2020-02-21 ENCOUNTER — Other Ambulatory Visit: Payer: Self-pay | Admitting: Internal Medicine

## 2020-03-14 ENCOUNTER — Other Ambulatory Visit: Payer: Self-pay | Admitting: Internal Medicine

## 2020-03-15 ENCOUNTER — Other Ambulatory Visit
Admission: RE | Admit: 2020-03-15 | Discharge: 2020-03-15 | Disposition: A | Discharge status: Home / Self Care | Payer: BC Managed Care – PPO | Source: Ambulatory Visit | Attending: General Surgery | Admitting: General Surgery

## 2020-03-15 ENCOUNTER — Other Ambulatory Visit: Payer: Self-pay

## 2020-03-15 DIAGNOSIS — Z01812 Encounter for preprocedural laboratory examination: Secondary | ICD-10-CM | POA: Diagnosis not present

## 2020-03-15 DIAGNOSIS — Z20822 Contact with and (suspected) exposure to covid-19: Secondary | ICD-10-CM | POA: Diagnosis not present

## 2020-03-15 LAB — SARS CORONAVIRUS 2 (TAT 6-24 HRS): SARS Coronavirus 2: NEGATIVE

## 2020-03-17 ENCOUNTER — Encounter: Payer: Self-pay | Admitting: General Surgery

## 2020-03-17 ENCOUNTER — Ambulatory Visit
Admission: RE | Admit: 2020-03-17 | Discharge: 2020-03-17 | Disposition: A | Discharge status: Home / Self Care | Payer: BC Managed Care – PPO | Attending: General Surgery | Admitting: General Surgery

## 2020-03-17 ENCOUNTER — Encounter
Admission: RE | Disposition: A | Discharge status: Home / Self Care | Payer: Self-pay | Source: Home / Self Care | Attending: General Surgery

## 2020-03-17 ENCOUNTER — Ambulatory Visit: Payer: BC Managed Care – PPO | Admitting: Anesthesiology

## 2020-03-17 ENCOUNTER — Other Ambulatory Visit: Payer: Self-pay

## 2020-03-17 DIAGNOSIS — K2950 Unspecified chronic gastritis without bleeding: Secondary | ICD-10-CM | POA: Diagnosis not present

## 2020-03-17 DIAGNOSIS — Z881 Allergy status to other antibiotic agents status: Secondary | ICD-10-CM | POA: Insufficient documentation

## 2020-03-17 DIAGNOSIS — D649 Anemia, unspecified: Secondary | ICD-10-CM | POA: Diagnosis not present

## 2020-03-17 DIAGNOSIS — Z882 Allergy status to sulfonamides status: Secondary | ICD-10-CM | POA: Insufficient documentation

## 2020-03-17 DIAGNOSIS — K449 Diaphragmatic hernia without obstruction or gangrene: Secondary | ICD-10-CM | POA: Diagnosis not present

## 2020-03-17 DIAGNOSIS — F419 Anxiety disorder, unspecified: Secondary | ICD-10-CM | POA: Diagnosis not present

## 2020-03-17 DIAGNOSIS — Z79899 Other long term (current) drug therapy: Secondary | ICD-10-CM | POA: Insufficient documentation

## 2020-03-17 DIAGNOSIS — Z8249 Family history of ischemic heart disease and other diseases of the circulatory system: Secondary | ICD-10-CM | POA: Diagnosis not present

## 2020-03-17 DIAGNOSIS — K317 Polyp of stomach and duodenum: Secondary | ICD-10-CM | POA: Insufficient documentation

## 2020-03-17 DIAGNOSIS — M1990 Unspecified osteoarthritis, unspecified site: Secondary | ICD-10-CM | POA: Insufficient documentation

## 2020-03-17 DIAGNOSIS — K644 Residual hemorrhoidal skin tags: Secondary | ICD-10-CM | POA: Insufficient documentation

## 2020-03-17 DIAGNOSIS — Z888 Allergy status to other drugs, medicaments and biological substances status: Secondary | ICD-10-CM | POA: Diagnosis not present

## 2020-03-17 DIAGNOSIS — F902 Attention-deficit hyperactivity disorder, combined type: Secondary | ICD-10-CM | POA: Diagnosis not present

## 2020-03-17 DIAGNOSIS — I10 Essential (primary) hypertension: Secondary | ICD-10-CM | POA: Insufficient documentation

## 2020-03-17 DIAGNOSIS — G4730 Sleep apnea, unspecified: Secondary | ICD-10-CM | POA: Insufficient documentation

## 2020-03-17 DIAGNOSIS — Z880 Allergy status to penicillin: Secondary | ICD-10-CM | POA: Insufficient documentation

## 2020-03-17 DIAGNOSIS — Z8042 Family history of malignant neoplasm of prostate: Secondary | ICD-10-CM | POA: Insufficient documentation

## 2020-03-17 DIAGNOSIS — R195 Other fecal abnormalities: Secondary | ICD-10-CM | POA: Diagnosis not present

## 2020-03-17 DIAGNOSIS — K219 Gastro-esophageal reflux disease without esophagitis: Secondary | ICD-10-CM | POA: Diagnosis not present

## 2020-03-17 HISTORY — PX: ESOPHAGOGASTRODUODENOSCOPY (EGD) WITH PROPOFOL: SHX5813

## 2020-03-17 SURGERY — ESOPHAGOGASTRODUODENOSCOPY (EGD) WITH PROPOFOL
Anesthesia: General

## 2020-03-17 MED ORDER — PROPOFOL 500 MG/50ML IV EMUL
INTRAVENOUS | Status: DC | PRN
  Administered 2020-03-17: 150 ug/kg/min via INTRAVENOUS
  Administered 2020-03-17: 50 mg via INTRAVENOUS

## 2020-03-17 MED ORDER — ONDANSETRON HCL 4 MG/2ML IJ SOLN
INTRAMUSCULAR | Status: DC | PRN
  Administered 2020-03-17: 4 mg via INTRAVENOUS

## 2020-03-17 MED ORDER — PROPOFOL 10 MG/ML IV BOLUS
INTRAVENOUS | Status: AC
  Filled 2020-03-17: qty 20

## 2020-03-17 MED ORDER — PROPOFOL 500 MG/50ML IV EMUL
INTRAVENOUS | Status: AC
  Filled 2020-03-17: qty 50

## 2020-03-17 MED ORDER — SODIUM CHLORIDE 0.9 % IV SOLN
INTRAVENOUS | Status: DC
  Administered 2020-03-17: 1000 mL via INTRAVENOUS

## 2020-03-17 MED ORDER — LIDOCAINE HCL (CARDIAC) PF 100 MG/5ML IV SOSY
PREFILLED_SYRINGE | INTRAVENOUS | Status: DC | PRN
  Administered 2020-03-17: 50 mg via INTRAVENOUS

## 2020-03-17 NOTE — Anesthesia Preprocedure Evaluation (Signed)
Anesthesia Evaluation  Patient identified by MRN, date of birth, ID band Patient awake    Reviewed: Allergy & Precautions, NPO status , Patient's Chart, lab work & pertinent test results  History of Anesthesia Complications Negative for: history of anesthetic complications  Airway Mallampati: II       Dental   Pulmonary sleep apnea (not using CPAP) ,           Cardiovascular hypertension, Pt. on medications      Neuro/Psych Anxiety    GI/Hepatic Neg liver ROS, GERD  Medicated and Poorly Controlled,  Endo/Other  negative endocrine ROS  Renal/GU negative Renal ROS     Musculoskeletal  (+) Arthritis ,   Abdominal   Peds  Hematology  (+) anemia ,   Anesthesia Other Findings   Reproductive/Obstetrics                             Anesthesia Physical  Anesthesia Plan  ASA: II  Anesthesia Plan: General   Post-op Pain Management:    Induction: Intravenous  PONV Risk Score and Plan: Propofol infusion  Airway Management Planned: Nasal Cannula  Additional Equipment:   Intra-op Plan:   Post-operative Plan:   Informed Consent: I have reviewed the patients History and Physical, chart, labs and discussed the procedure including the risks, benefits and alternatives for the proposed anesthesia with the patient or authorized representative who has indicated his/her understanding and acceptance.       Plan Discussed with:   Anesthesia Plan Comments:         Anesthesia Quick Evaluation

## 2020-03-17 NOTE — H&P (Signed)
Destiny Martin [id number] 1961-11-19     HPI:  Patient with recently noted modest fall in HGB and one heme positive stool.  Since then, 4 stool hemacult tests negative.  History Celebrex use.  Colonoscopy 3 years ago was unremarkable.  For EGD to assess possible gastric source for single heme positive stool.  Patient has a symptomatic anal skin tag for excision.   No medications prior to admission.   Allergies  Allergen Reactions  . Erythromycin Nausea Only  . Lisinopril Swelling    Angio-edema  . Penicillins   . Sulfa Antibiotics Rash   Past Medical History:  Diagnosis Date  . ADD (attention deficit disorder)   . Allergy   . GERD (gastroesophageal reflux disease)   . Hypertension    Past Surgical History:  Procedure Laterality Date  . COLONOSCOPY WITH PROPOFOL N/A 03/27/2017   Procedure: COLONOSCOPY WITH PROPOFOL;  Surgeon: Earline Mayotte, MD;  Location: [facility] ENDOSCOPY;  Service: Endoscopy;  Laterality: N/A;  . ESOPHAGOGASTRODUODENOSCOPY (EGD) WITH PROPOFOL N/A 03/27/2017   Procedure: ESOPHAGOGASTRODUODENOSCOPY (EGD) WITH PROPOFOL;  Surgeon: Earline Mayotte, MD;  Location: [facility] ENDOSCOPY;  Service: Endoscopy;  Laterality: N/A;  . ESOPHAGOGASTRODUODENOSCOPY ENDOSCOPY     2008   . FOOT SURGERY Right 2007  . NECK SURGERY  2016  . TONSILLECTOMY  1974   Social History   Socioeconomic History  . Marital status: Married    Spouse name: Not on file  . Number of children: Not on file  . Years of education: Not on file  . Highest education level: Not on file  Occupational History  . Not on file  Tobacco Use  . Smoking status: Never Smoker  . Smokeless tobacco: Never Used  Substance and Sexual Activity  . Alcohol use: Yes    Comment: moderate  . Drug use: No  . Sexual activity: Yes    Birth control/protection: Pill  Other Topics Concern  . Not on file  Social History Narrative  . Not on file   Social Determinants of Health   Financial Resource Strain:   .  Difficulty of Paying Living Expenses:   Food Insecurity:   . Worried About Programme researcher, broadcasting/film/video in the Last Year:   . Barista in the Last Year:   Transportation Needs:   . Freight forwarder (Medical):   Marland Kitchen Lack of Transportation (Non-Medical):   Physical Activity:   . Days of Exercise per Week:   . Minutes of Exercise per Session:   Stress:   . Feeling of Stress :   Social Connections:   . Frequency of Communication with Friends and Family:   . Frequency of Social Gatherings with Friends and Family:   . Attends Religious Services:   . Active Member of Clubs or Organizations:   . Attends Banker Meetings:   Marland Kitchen Marital Status:   Intimate Partner Violence:   . Fear of Current or Ex-Partner:   . Emotionally Abused:   Marland Kitchen Physically Abused:   . Sexually Abused:    Social History   Social History Narrative  . Not on file     ROS: Negative.     PE: HEENT: Negative. Lungs: Clear. Cardio: RR.   Assessment/Plan:  Proceed with planned endoscopy and anal tag excision.   Merrily Pew [facility] 03/17/2020

## 2020-03-17 NOTE — Anesthesia Postprocedure Evaluation (Signed)
Anesthesia Post Note  Patient: Destiny Martin  Procedure(s) Performed: ESOPHAGOGASTRODUODENOSCOPY (EGD) WITH PROPOFOL (N/A )  Patient location during evaluation: Endoscopy Anesthesia Type: General Level of consciousness: awake and alert and oriented Pain management: pain level controlled Vital Signs Assessment: post-procedure vital signs reviewed and stable Respiratory status: spontaneous breathing Cardiovascular status: blood pressure returned to baseline Anesthetic complications: no     Last Vitals:  Vitals:   03/17/20 0822 03/17/20 0832  BP: (!) 97/55 111/78  Pulse: 85 78  Resp: 20 16  Temp:    SpO2: 96% 100%    Last Pain:  Vitals:   03/17/20 0822  TempSrc:   PainSc: 0-No pain                 Sherel Fennell

## 2020-03-17 NOTE — Op Note (Signed)
Preoperative diagnosis: Anal skin tag, left posterior.  Postoperative diagnosis: Same.  Operative procedure: Excision of anal skin tag.  Operating surgeon: Donnalee Curry, MD.  Anesthesia: Monitored anesthesia, 5 cc 1% Xylocaine with 0.25% Marcaine with 1: 200,000 units of epinephrine.  Estimated blood loss: None.  Clinical note: The patient has a pedunculated skin tag which is symptomatic with perianal cleansing.  Was planned for excision at the time of her upper endoscopy.  Operative note: Local anesthesia was infiltrated after Betadine prep on induction of anesthesia for her EGD.  After that procedure was completed the area was transfixed with a 3-0 Vicryl figure-of-eight suture.  The anal skin tag was excised and the suture tied with excellent hemostasis.  The procedure was well-tolerated.

## 2020-03-17 NOTE — Transfer of Care (Signed)
Immediate Anesthesia Transfer of Care Note  Patient: Destiny Martin  Procedure(s) Performed: ESOPHAGOGASTRODUODENOSCOPY (EGD) WITH PROPOFOL (N/A )  Patient Location: PACU  Anesthesia Type:General  Level of Consciousness: awake, alert  and oriented  Airway & Oxygen Therapy: Patient Spontanous Breathing and Patient connected to nasal cannula oxygen  Post-op Assessment: Report given to RN and Post -op Vital signs reviewed and stable  Post vital signs: Reviewed and stable  Last Vitals:  Vitals Value Taken Time  BP 99/81 03/17/20 0813  Temp    Pulse 90 03/17/20 0814  Resp 15 03/17/20 0814  SpO2 99 % 03/17/20 0814  Vitals shown include unvalidated device data.  Last Pain:  Vitals:   03/17/20 0716  TempSrc: Temporal  PainSc: 0-No pain         Complications: No apparent anesthesia complications and Patient re-intubated

## 2020-03-17 NOTE — Op Note (Signed)
[facility] Gastroenterology Patient Name: Destiny Martin Procedure Date: 03/17/2020 7:29 AM MRN: [id number] Account #: [id number] Date of Birth: October 04, 1961 Admit Type: Outpatient Age: 59 Room: [facility] ENDO ROOM 1 Gender: Female Note Status: Finalized Procedure:             Upper GI endoscopy Indications:           Heme positive stool Providers:             Earline Mayotte, MD Referring MD:          Duncan Dull, MD (Referring MD) Medicines:             Monitored Anesthesia Care Complications:         No immediate complications. Procedure:             Pre-Anesthesia Assessment:                        - Prior to the procedure, a History and Physical was                         performed, and patient medications, allergies and                         sensitivities were reviewed. The patient's tolerance                         of previous anesthesia was reviewed.                        - The risks and benefits of the procedure and the                         sedation options and risks were discussed with the                         patient. All questions were answered and informed                         consent was obtained.                        After obtaining informed consent, the endoscope was                         passed under direct vision. Throughout the procedure,                         the patient's blood pressure, pulse, and oxygen                         saturations were monitored continuously. The Endoscope                         was introduced through the mouth, and advanced to the                         second part of duodenum. The upper GI endoscopy was  somewhat difficult due to unusual anatomy. The patient                         tolerated the procedure well. 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 The GEJ was tortuous       suggesting a para-esophageal hernia.      Multiple 5 mm sessile polyps  with no bleeding and no stigmata of recent       bleeding were found in the gastric body. Biopsies were taken with a cold       forceps for histology.      Segmental mild inflammation characterized by erythema and linear       erosions was found in the gastric antrum. Biopsies were taken with a       cold forceps for histology.      The stomach was normal. Impression:            - Medium-sized paraesophageal hernia.                        - Multiple gastric polyps. Biopsied.                        - Chronic gastritis. Biopsied.                        - Normal stomach. Recommendation:        - Telephone endoscopist for pathology results in 1                         week. Procedure Code(s):     --- Professional ---                        [phone], Esophagogastroduodenoscopy, flexible,                         transoral; with biopsy, single or multiple Diagnosis Code(s):     --- Professional ---                        K44.9, Diaphragmatic hernia without obstruction or                         gangrene                        K31.7, Polyp of stomach and duodenum                        K29.50, Unspecified chronic gastritis without bleeding                        R19.5, Other fecal abnormalities CPT copyright 2019 American Medical Association. All rights reserved. The codes documented in this report are preliminary and upon coder review may  be revised to meet current compliance requirements. Robert Bellow, MD 03/17/2020 8:09:30 AM This report has been signed electronically. Number of Addenda: 0 Note Initiated On: 03/17/2020 7:29 AM      [facility]

## 2020-03-18 ENCOUNTER — Encounter: Payer: Self-pay | Admitting: *Deleted

## 2020-03-19 LAB — SURGICAL PATHOLOGY

## 2020-03-22 ENCOUNTER — Other Ambulatory Visit: Payer: Self-pay | Admitting: Internal Medicine

## 2020-03-22 DIAGNOSIS — D649 Anemia, unspecified: Secondary | ICD-10-CM

## 2020-03-22 MED ORDER — TANDEM 162-115.2 MG PO CAPS: 1.0000 | ORAL_CAPSULE | Freq: Every day | ORAL | 0 refills | Status: DC

## 2020-04-09 ENCOUNTER — Ambulatory Visit: Payer: BC Managed Care – PPO | Admitting: Internal Medicine

## 2020-04-14 ENCOUNTER — Other Ambulatory Visit: Payer: Self-pay

## 2020-04-16 ENCOUNTER — Ambulatory Visit: Payer: BC Managed Care – PPO | Admitting: Internal Medicine

## 2020-05-04 ENCOUNTER — Other Ambulatory Visit: Payer: Self-pay

## 2020-05-05 ENCOUNTER — Telehealth: Payer: Self-pay

## 2020-05-05 ENCOUNTER — Ambulatory Visit (INDEPENDENT_AMBULATORY_CARE_PROVIDER_SITE_OTHER): Payer: BC Managed Care – PPO | Admitting: Internal Medicine

## 2020-05-05 ENCOUNTER — Encounter: Payer: Self-pay | Admitting: Internal Medicine

## 2020-05-05 ENCOUNTER — Other Ambulatory Visit (HOSPITAL_COMMUNITY)
Admission: RE | Admit: 2020-05-05 | Discharge: 2020-05-05 | Disposition: A | Discharge status: Home / Self Care | Payer: BC Managed Care – PPO | Source: Ambulatory Visit | Attending: Internal Medicine | Admitting: Internal Medicine

## 2020-05-05 VITALS — BP 132/90 | HR 89 | Temp 97.8°F | Resp 15 | Ht 62.0 in | Wt 154.0 lb

## 2020-05-05 DIAGNOSIS — N952 Postmenopausal atrophic vaginitis: Secondary | ICD-10-CM

## 2020-05-05 DIAGNOSIS — Z124 Encounter for screening for malignant neoplasm of cervix: Secondary | ICD-10-CM | POA: Diagnosis not present

## 2020-05-05 DIAGNOSIS — I10 Essential (primary) hypertension: Secondary | ICD-10-CM | POA: Diagnosis not present

## 2020-05-05 DIAGNOSIS — R300 Dysuria: Secondary | ICD-10-CM

## 2020-05-05 DIAGNOSIS — D510 Vitamin B12 deficiency anemia due to intrinsic factor deficiency: Secondary | ICD-10-CM

## 2020-05-05 DIAGNOSIS — Z0000 Encounter for general adult medical examination without abnormal findings: Secondary | ICD-10-CM | POA: Diagnosis not present

## 2020-05-05 MED ORDER — CIPROFLOXACIN HCL 250 MG PO TABS: 250.0000 mg | ORAL_TABLET | Freq: Two times a day (BID) | ORAL | 0 refills | Status: DC

## 2020-05-05 MED ORDER — TIZANIDINE HCL 4 MG PO CAPS: 4.0000 mg | ORAL_CAPSULE | Freq: Three times a day (TID) | ORAL | 0 refills | Status: DC | PRN

## 2020-05-05 MED ORDER — ESTROGENS, CONJUGATED 0.625 MG/GM VA CREA: 1.0000 | TOPICAL_CREAM | Freq: Every day | VAGINAL | 12 refills | Status: AC

## 2020-05-05 NOTE — Telephone Encounter (Signed)
PA submitted for Premarin Vaginal Cream.

## 2020-05-05 NOTE — Progress Notes (Signed)
Patient ID: Destiny Martin, female    DOB: 05-09-1961  Age: 59 y.o. MRN: [id number]  The patient is here for annual preventive  examination and management of other chronic and acute problems.  This visit occurred during the SARS-CoV-2 public health emergency.  Safety protocols were in place, including screening questions prior to the visit, additional usage of staff PPE, and extensive cleaning of exam room while observing appropriate contact time as indicated for disinfecting solutions.     The risk factors are reflected in the social history.  The roster of all physicians providing medical care to patient - is listed in the Snapshot section of the chart.  Activities of daily living:  The patient is 100% independent in all ADLs: dressing, toileting, feeding as well as independent mobility  Home safety : The patient has smoke detectors in the home. They wear seatbelts.  There are no unsecured firearms at home. There is no violence in the home.   There is no risks for hepatitis, STDs or HIV. There is no   history of blood transfusion. They have no travel history to infectious disease endemic areas of the world.  The patient has seen their dentist in the last six month. They have seen their eye doctor in the last year. They admit to slight hearing difficulty with regard to whispered voices and some television programs.  They have deferred audiologic testing in the last year.  They do not  have excessive sun exposure. Discussed the need for sun protection: hats, long sleeves and use of sunscreen if there is significant sun exposure.   Diet: the importance of a healthy diet is discussed. They do have a healthy diet.  The benefits of regular aerobic exercise were discussed. She walks 4 times per week ,  20 minutes.   Depression screen: there are no signs or vegative symptoms of depression- irritability, change in appetite, anhedonia, sadness/tearfullness.   The following portions of the patient's  history were reviewed and updated as appropriate: allergies, current medications, past family history, past medical history,  past surgical history, past social history  and problem list.  Visual acuity was not assessed per patient preference since she has regular follow up with her ophthalmologist. Hearing and body mass index were assessed and reviewed.   During the course of the visit the patient was educated and counseled about appropriate screening and preventive services including : fall prevention , diabetes screening, nutrition counseling, colorectal cancer screening, and recommended immunizations.    CC: The primary encounter diagnosis was Atrophic vaginitis. Diagnoses of Cervical cancer screening, Essential hypertension, Pernicious anemia, Encounter for preventive health examination, and Dysuria were also pertinent to this visit.  This visit occurred during the SARS-CoV-2 public health emergency.  Safety protocols were in place, including screening questions prior to the visit, additional usage of staff PPE, and extensive cleaning of exam room while observing appropriate contact time as indicated for disinfecting solutions.    Patient has received NO  doses of the available COVID 19 vaccines.   Patient continues to mask when outside of the home except when walking in yard or at safe distances from others .  Patient denies any change in mood or development of unhealthy behaviors resuting from the pandemic's restriction of activities and socialization.    Dysuria x 2 days  History of interstitial cystitis diagnosed remotely.  Has been asymptomatic for years.   Not losing weight,  Not exercising or taking phentermine regularly or appropriately   History Destiny Martin  has a past medical history of ADD (attention deficit disorder), Allergy, GERD (gastroesophageal reflux disease), and Hypertension.   She has a past surgical history that includes Tonsillectomy (1974); Foot surgery (Right, 2007);  Neck surgery (2016); Esophagogastroduodenoscopy endoscopy; Colonoscopy with propofol (N/A, 03/27/2017); Esophagogastroduodenoscopy (egd) with propofol (N/A, 03/27/2017); and Esophagogastroduodenoscopy (egd) with propofol (N/A, 03/17/2020).   Her family history includes Heart disease in her father; Hypertension in her father; Prostate cancer in her father.She reports that she has never smoked. She has never used smokeless tobacco. She reports current alcohol use. She reports that she does not use drugs.  Outpatient Medications Prior to Visit  Medication Sig Dispense Refill   ALPRAZolam (XANAX) 0.5 MG tablet Take 1 tablet (0.5 mg total) by mouth at bedtime as needed. 20 tablet 5   amphetamine-dextroamphetamine (ADDERALL XR) 30 MG 24 hr capsule Take 30 mg by mouth.  0   amphetamine-dextroamphetamine (ADDERALL) 30 MG tablet Take 1 tablet by mouth 2 (two) times daily.  0   butalbital-acetaminophen-caffeine (FIORICET) 50-325-40 MG tablet Take 1 tablet by mouth every 6 (six) hours as needed for headache. 60 tablet 1   celecoxib (CELEBREX) 200 MG capsule TAKE 1 CAPSULE BY MOUTH TWICE A DAY 60 capsule 5   Cholecalciferol (VITAMIN D3 PO) Take 1 capsule by mouth daily. Takes 5,000 units daily     cyanocobalamin (,VITAMIN B-12,) 1000 MCG/ML injection INJECT 1 ML (1,000 MCG TOTAL) INTO THE MUSCLE EVERY 30 (THIRTY) DAYS. 3 mL 3   esomeprazole (NEXIUM) 40 MG capsule TAKE 1 CAPSULE (40 MG TOTAL) BY MOUTH 2 (TWO) TIMES DAILY BEFORE A MEAL. 180 capsule 1   ferrous fumarate-iron polysaccharide complex (TANDEM) 162-115.2 MG CAPS capsule Take 1 capsule by mouth daily with breakfast. 90 capsule 0   FLUoxetine (PROZAC) 40 MG capsule Take 40 mg by mouth daily.     losartan (COZAAR) 50 MG tablet TAKE 1 TABLET BY MOUTH EVERY DAY 90 tablet 1   traMADol (ULTRAM) 50 MG tablet Take 1 tablet (50 mg total) by mouth every 8 (eight) hours as needed. 30 tablet 0   traZODone (DESYREL) 50 MG tablet TAKE 0.5-1 TABLETS (25-50  MG TOTAL) BY MOUTH AT BEDTIME AS NEEDED FOR SLEEP. (NEED APPT) 90 tablet 1   phentermine (ADIPEX-P) 37.5 MG tablet Take 1 tablet (37.5 mg total) by mouth daily before breakfast. (Patient not taking: Reported on 05/05/2020) 30 tablet 2   No facility-administered medications prior to visit.    Review of Systems   Patient denies headache, fevers, malaise, unintentional weight loss, skin rash, eye pain, sinus congestion and sinus pain, sore throat, dysphagia,  hemoptysis , cough, dyspnea, wheezing, chest pain, palpitations, orthopnea, edema, abdominal pain, nausea, melena, diarrhea, constipation, flank pain, dysuria, hematuria, urinary  Frequency, nocturia, numbness, tingling, seizures,  Focal weakness, Loss of consciousness,  Tremor, insomnia, depression, anxiety, and suicidal ideation.     Objective:  BP 132/90 (BP Location: Left Arm, Patient Position: Sitting, Cuff Size: Normal)    Pulse 89    Temp 97.8 F (36.6 C) (Temporal)    Resp 15    Ht 5\' 2"  (1.575 m)    Wt 154 lb (69.9 kg)    LMP 01/15/2018 (Approximate)    SpO2 99%    BMI 28.17 kg/m   Physical Exam  General Appearance:    Alert, cooperative, no distress, appears stated age  Head:    Normocephalic, without obvious abnormality, atraumatic  Eyes:    PERRL, conjunctiva/corneas clear, EOM's intact, fundi    benign,  both eyes  Ears:    Normal TM's and external ear canals, both ears  Nose:   Nares normal, septum midline, mucosa normal, no drainage    or sinus tenderness  Throat:   Lips, mucosa, and tongue normal; teeth and gums normal  Neck:   Supple, symmetrical, trachea midline, no adenopathy;    thyroid:  no enlargement/tenderness/nodules; no carotid   bruit or JVD  Back:     Symmetric, no curvature, ROM normal, no CVA tenderness  Lungs:     Clear to auscultation bilaterally, respirations unlabored  Chest Wall:    No tenderness or deformity   Heart:    Regular rate and rhythm, S1 and S2 normal, no murmur, rub   or gallop  Breast  Exam:    No tenderness, masses, or nipple abnormality  Abdomen:     Soft, non-tender, bowel sounds active all four quadrants,    no masses, no organomegaly  Genitalia:    Pelvic: cervix normal in appearance, external genitalia normal, no adnexal masses or tenderness, no cervical motion tenderness, rectovaginal septum normal, uterus normal size, shape, and consistency and vagina atrophic without discharge  Extremities:   Extremities normal, atraumatic, no cyanosis or edema  Pulses:   2+ and symmetric all extremities  Skin:   Skin color, texture, turgor normal, no rashes or lesions  Lymph nodes:   Cervical, supraclavicular, and axillary nodes normal  Neurologic:   CNII-XII intact, normal strength, sensation and reflexes    throughout    Assessment & Plan:   Problem List Items Addressed This Visit      Unprioritized   Atrophic vaginitis - Primary    Trial of estrogen cream      Relevant Medications   conjugated estrogens (PREMARIN) vaginal cream   Dysuria    She has self treated with one day of fluoroquinolone.  Will check UA and culture next week if symptoms do not resolve      Relevant Orders   Urinalysis, Routine w reflex microscopic   Urine Culture   Encounter for preventive health examination    age appropriate education and counseling updated, referrals for preventative services and immunizations addressed, dietary and smoking counseling addressed, most recent labs reviewed.  I have personally reviewed and have noted:  1) the patient's medical and social history 2) The pt's use of alcohol, tobacco, and illicit drugs 3) The patient's current medications and supplements 4) Functional ability including ADL's, fall risk, home safety risk, hearing and visual impairment 5) Diet and physical activities 6) Evidence for depression or mood disorder 7) The patient's height, weight, and BMI have been recorded in the chart  I have made referrals, and provided counseling and education  based on review of the above      Hypertension   Relevant Orders   Lipid panel   Comprehensive metabolic panel   Pernicious anemia   Relevant Orders   Iron, TIBC and Ferritin Panel   CBC with Differential/Platelet   B12 and Folate Panel    Other Visit Diagnoses    Cervical cancer screening       Relevant Orders   Cytology - PAP( [place]) (Completed)      I am having Destiny Martin start on tiZANidine, ciprofloxacin, and conjugated estrogens. I am also having her maintain her amphetamine-dextroamphetamine, amphetamine-dextroamphetamine, traMADol, cyanocobalamin, Cholecalciferol (VITAMIN D3 PO), FLUoxetine, ALPRAZolam, esomeprazole, butalbital-acetaminophen-caffeine, phentermine, traZODone, losartan, celecoxib, and Tandem.  Meds ordered this encounter  Medications   tiZANidine (ZANAFLEX) 4 MG capsule  Sig: Take 1 capsule (4 mg total) by mouth 3 (three) times daily as needed for muscle spasms.    Dispense:  90 capsule    Refill:  0   ciprofloxacin (CIPRO) 250 MG tablet    Sig: Take 1 tablet (250 mg total) by mouth 2 (two) times daily.    Dispense:  6 tablet    Refill:  0   conjugated estrogens (PREMARIN) vaginal cream    Sig: Place 1 Applicatorful vaginally daily.    Dispense:  42.5 g    Refill:  12    There are no discontinued medications.  Follow-up: No follow-ups on file.   Crecencio Mc, MD

## 2020-05-05 NOTE — Patient Instructions (Addendum)
Turmeric,  Osteo biflex and fish oil are al worth trying for inflammation  Tizanidine is your muscle relaxer  You can add up to 2000 mg of acetominophen (tylenol) every day safely  In divided doses (500 mg every 6 hours  Or 1000 mg every 12 hours.)  Vaginal estrogen and cipro called to your pharmacy .  Use the vaginal estrogen NIGHTLY for 2 weeks,  THEN REDUCE DOSE TO twice weekly    Return for fasting labs after 6 weeks of iron supplementation   Health Maintenance for Postmenopausal Women Menopause is a normal process in which your ability to get pregnant comes to an end. This process happens slowly over many months or years, usually between the ages of 17 and 53. Menopause is complete when you have missed your menstrual periods for 12 months. It is important to talk with your health care provider about some of the most common conditions that affect women after menopause (postmenopausal women). These include heart disease, cancer, and bone loss (osteoporosis). Adopting a healthy lifestyle and getting preventive care can help to promote your health and wellness. The actions you take can also lower your chances of developing some of these common conditions. What should I know about menopause? During menopause, you may get a number of symptoms, such as:  Hot flashes. These can be moderate or severe.  Night sweats.  Decrease in sex drive.  Mood swings.  Headaches.  Tiredness.  Irritability.  Memory problems.  Insomnia. Choosing to treat or not to treat these symptoms is a decision that you make with your health care provider. Do I need hormone replacement therapy?  Hormone replacement therapy is effective in treating symptoms that are caused by menopause, such as hot flashes and night sweats.  Hormone replacement carries certain risks, especially as you become older. If you are thinking about using estrogen or estrogen with progestin, discuss the benefits and risks with your  health care provider. What is my risk for heart disease and stroke? The risk of heart disease, heart attack, and stroke increases as you age. One of the causes may be a change in the body's hormones during menopause. This can affect how your body uses dietary fats, triglycerides, and cholesterol. Heart attack and stroke are medical emergencies. There are many things that you can do to help prevent heart disease and stroke. Watch your blood pressure  High blood pressure causes heart disease and increases the risk of stroke. This is more likely to develop in people who have high blood pressure readings, are of African descent, or are overweight.  Have your blood pressure checked: ? Every 3-5 years if you are 18-37 years of age. ? Every year if you are 14 years old or older. Eat a healthy diet   Eat a diet that includes plenty of vegetables, fruits, low-fat dairy products, and lean protein.  Do not eat a lot of foods that are high in solid fats, added sugars, or sodium. Get regular exercise Get regular exercise. This is one of the most important things you can do for your health. Most adults should:  Try to exercise for at least 150 minutes each week. The exercise should increase your heart rate and make you sweat (moderate-intensity exercise).  Try to do strengthening exercises at least twice each week. Do these in addition to the moderate-intensity exercise.  Spend less time sitting. Even light physical activity can be beneficial. Other tips  Work with your health care provider to achieve or maintain  a healthy weight.  Do not use any products that contain nicotine or tobacco, such as cigarettes, e-cigarettes, and chewing tobacco. If you need help quitting, ask your health care provider.  Know your numbers. Ask your health care provider to check your cholesterol and your blood sugar (glucose). Continue to have your blood tested as directed by your health care provider. Do I need  screening for cancer? Depending on your health history and family history, you may need to have cancer screening at different stages of your life. This may include screening for:  Breast cancer.  Cervical cancer.  Lung cancer.  Colorectal cancer. What is my risk for osteoporosis? After menopause, you may be at increased risk for osteoporosis. Osteoporosis is a condition in which bone destruction happens more quickly than new bone creation. To help prevent osteoporosis or the bone fractures that can happen because of osteoporosis, you may take the following actions:  If you are 71-36 years old, get at least 1,000 mg of calcium and at least 600 mg of vitamin D per day.  If you are older than age 84 but younger than age 27, get at least 1,200 mg of calcium and at least 600 mg of vitamin D per day.  If you are older than age 63, get at least 1,200 mg of calcium and at least 800 mg of vitamin D per day. Smoking and drinking excessive alcohol increase the risk of osteoporosis. Eat foods that are rich in calcium and vitamin D, and do weight-bearing exercises several times each week as directed by your health care provider. How does menopause affect my mental health? Depression may occur at any age, but it is more common as you become older. Common symptoms of depression include:  Low or sad mood.  Changes in sleep patterns.  Changes in appetite or eating patterns.  Feeling an overall lack of motivation or enjoyment of activities that you previously enjoyed.  Frequent crying spells. Talk with your health care provider if you think that you are experiencing depression. General instructions See your health care provider for regular wellness exams and vaccines. This may include:  Scheduling regular health, dental, and eye exams.  Getting and maintaining your vaccines. These include: ? Influenza vaccine. Get this vaccine each year before the flu season begins. ? Pneumonia  vaccine. ? Shingles vaccine. ? Tetanus, diphtheria, and pertussis (Tdap) booster vaccine. Your health care provider may also recommend other immunizations. Tell your health care provider if you have ever been abused or do not feel safe at home. Summary  Menopause is a normal process in which your ability to get pregnant comes to an end.  This condition causes hot flashes, night sweats, decreased interest in sex, mood swings, headaches, or lack of sleep.  Treatment for this condition may include hormone replacement therapy.  Take actions to keep yourself healthy, including exercising regularly, eating a healthy diet, watching your weight, and checking your blood pressure and blood sugar levels.  Get screened for cancer and depression. Make sure that you are up to date with all your vaccines. This information is not intended to replace advice given to you by your health care provider. Make sure you discuss any questions you have with your health care provider. Document Revised: 11/13/2018 Document Reviewed: 11/13/2018 Elsevier Patient Education  2020 [employer].

## 2020-05-06 DIAGNOSIS — N952 Postmenopausal atrophic vaginitis: Secondary | ICD-10-CM | POA: Insufficient documentation

## 2020-05-06 DIAGNOSIS — R300 Dysuria: Secondary | ICD-10-CM | POA: Insufficient documentation

## 2020-05-06 LAB — CYTOLOGY - PAP
Comment: NEGATIVE
Diagnosis: NEGATIVE
High risk HPV: NEGATIVE

## 2020-05-06 NOTE — Assessment & Plan Note (Signed)
She has self treated with one day of fluoroquinolone.  Will check UA and culture next week if symptoms do not resolve

## 2020-05-06 NOTE — Assessment & Plan Note (Signed)
Trial of estrogen cream

## 2020-05-06 NOTE — Assessment & Plan Note (Signed)

## 2020-05-07 MED ORDER — ESTRADIOL 10 MCG VA TABS: ORAL_TABLET | VAGINAL | 0 refills | Status: DC

## 2020-05-07 MED ORDER — CIPROFLOXACIN HCL 250 MG PO TABS: 250.0000 mg | ORAL_TABLET | Freq: Two times a day (BID) | ORAL | 0 refills | Status: DC

## 2020-05-09 ENCOUNTER — Other Ambulatory Visit: Payer: Self-pay | Admitting: Internal Medicine

## 2020-05-17 ENCOUNTER — Other Ambulatory Visit: Payer: BC Managed Care – PPO

## 2020-05-27 ENCOUNTER — Other Ambulatory Visit: Payer: Self-pay | Admitting: Internal Medicine

## 2020-06-09 ENCOUNTER — Other Ambulatory Visit: Payer: Self-pay | Admitting: Internal Medicine

## 2020-06-15 ENCOUNTER — Other Ambulatory Visit: Payer: Self-pay | Admitting: Internal Medicine

## 2020-06-18 ENCOUNTER — Other Ambulatory Visit: Payer: Self-pay | Admitting: Internal Medicine

## 2020-06-26 ENCOUNTER — Other Ambulatory Visit: Payer: Self-pay | Admitting: Internal Medicine

## 2020-07-12 ENCOUNTER — Other Ambulatory Visit: Payer: Self-pay | Admitting: Internal Medicine

## 2020-07-13 ENCOUNTER — Other Ambulatory Visit: Payer: Self-pay | Admitting: Internal Medicine

## 2020-08-06 ENCOUNTER — Other Ambulatory Visit: Payer: Self-pay | Admitting: Internal Medicine

## 2020-08-13 ENCOUNTER — Other Ambulatory Visit: Payer: Self-pay | Admitting: Internal Medicine

## 2020-08-18 ENCOUNTER — Other Ambulatory Visit: Payer: Self-pay | Admitting: Internal Medicine

## 2020-08-21 ENCOUNTER — Other Ambulatory Visit: Payer: Self-pay | Admitting: Internal Medicine

## 2020-10-26 ENCOUNTER — Other Ambulatory Visit: Payer: Self-pay | Admitting: Internal Medicine

## 2020-11-15 ENCOUNTER — Other Ambulatory Visit: Payer: Self-pay | Admitting: Internal Medicine

## 2020-11-16 ENCOUNTER — Other Ambulatory Visit: Payer: Self-pay | Admitting: Internal Medicine

## 2020-11-16 DIAGNOSIS — Z1231 Encounter for screening mammogram for malignant neoplasm of breast: Secondary | ICD-10-CM

## 2020-12-22 ENCOUNTER — Ambulatory Visit
Admission: RE | Admit: 2020-12-22 | Discharge: 2020-12-22 | Disposition: A | Discharge status: Home / Self Care | Payer: BC Managed Care – PPO | Source: Ambulatory Visit | Attending: Internal Medicine | Admitting: Internal Medicine

## 2020-12-22 ENCOUNTER — Other Ambulatory Visit: Payer: Self-pay

## 2020-12-22 DIAGNOSIS — Z1231 Encounter for screening mammogram for malignant neoplasm of breast: Secondary | ICD-10-CM | POA: Diagnosis not present

## 2020-12-25 ENCOUNTER — Other Ambulatory Visit: Payer: Self-pay | Admitting: Internal Medicine

## 2021-01-05 NOTE — Telephone Encounter (Signed)
Called and spoke to [place]. Scheduled a video visit with Dr. Darrick Huntsman on 01/06/2021 at 12:30pm.

## 2021-01-06 ENCOUNTER — Telehealth (INDEPENDENT_AMBULATORY_CARE_PROVIDER_SITE_OTHER): Payer: BC Managed Care – PPO | Admitting: Internal Medicine

## 2021-01-06 ENCOUNTER — Encounter: Payer: Self-pay | Admitting: Internal Medicine

## 2021-01-06 DIAGNOSIS — U071 COVID-19: Secondary | ICD-10-CM | POA: Insufficient documentation

## 2021-01-06 HISTORY — DX: COVID-19: U07.1

## 2021-01-06 MED ORDER — ONDANSETRON 4 MG PO TBDP: 4.0000 mg | ORAL_TABLET | Freq: Three times a day (TID) | ORAL | 0 refills | Status: DC | PRN

## 2021-01-06 MED ORDER — HYDROCOD POLST-CPM POLST ER 10-8 MG/5ML PO SUER: 5.0000 mL | Freq: Every evening | ORAL | 0 refills | Status: DC | PRN

## 2021-01-06 MED ORDER — BENZONATATE 200 MG PO CAPS: 200.0000 mg | ORAL_CAPSULE | Freq: Three times a day (TID) | ORAL | 1 refills | Status: DC | PRN

## 2021-01-06 MED ORDER — PREDNISONE 10 MG PO TABS: ORAL_TABLET | ORAL | 0 refills | Status: DC

## 2021-01-06 MED ORDER — LEVOFLOXACIN 500 MG PO TABS: 500.0000 mg | ORAL_TABLET | Freq: Every day | ORAL | 0 refills | Status: DC

## 2021-01-06 NOTE — Assessment & Plan Note (Signed)
In an unvaccinated individural with risk factors of hypertension , OSA and overweight.  Has failed to improve using ivermectin and doxycycline prescribed by community physician. She has persistent fevers and new onset sinusitis.  Levaquin, prednisone and cough suppressant along with rx for zofran for nausea sent to pharmacy

## 2021-01-06 NOTE — Progress Notes (Signed)
Virtual Visit via Caregility  This visit type was conducted due to national recommendations for restrictions regarding the COVID-19 pandemic (e.g. social distancing).  This format is felt to be most appropriate for this patient at this time.  All issues noted in this document were discussed and addressed.  No physical exam was performed (except for noted visual exam findings with Video Visits).   I connected with@ on 01/06/21 at 12:30 PM EST by a video enabled telemedicine applicationand verified that I am speaking with the correct person using two identifiers. Location patient: home Location provider: work or home office Persons participating in the virtual visit: patient, provider  I discussed the limitations, risks, security and privacy concerns of performing an evaluation and management service by telephone and the availability of in person appointments. I also discussed with the patient that there may be a patient responsible charge related to this service. The patient expressed understanding and agreed to proceed.  Reason for visit: COVID INFECTION   HPI:  70 yt old unvaccinated female diagnosed on jan 24 with COVID after having contact with infected husband.  Symptoms of cough, congestion , relative hypoxia and fatigue began on jan 26.Marland Kitchen  Husand was sick first for 2 weeks and tested positive on jan 23.  Patient was  Treated by community physician witH IVERMECTIN 13.5 mg daily x 5 days AND doxycycline  But continues to have cough,  Fevers to 101 and now has a rash on her back.  Some right sided facial/ear pain and sinus burning  .  sats on room air have not dropped below 92% cough worse at night.    ROS: See pertinent positives and negatives per HPI.  Past Medical History:  Diagnosis Date  . ADD (attention deficit disorder)   . Allergy   . GERD (gastroesophageal reflux disease)   . Hypertension     Past Surgical History:  Procedure Laterality Date  . COLONOSCOPY WITH PROPOFOL N/A  03/27/2017   Procedure: COLONOSCOPY WITH PROPOFOL;  Surgeon: Earline Mayotte, MD;  Location: [facility] ENDOSCOPY;  Service: Endoscopy;  Laterality: N/A;  . ESOPHAGOGASTRODUODENOSCOPY (EGD) WITH PROPOFOL N/A 03/27/2017   Procedure: ESOPHAGOGASTRODUODENOSCOPY (EGD) WITH PROPOFOL;  Surgeon: Earline Mayotte, MD;  Location: [facility] ENDOSCOPY;  Service: Endoscopy;  Laterality: N/A;  . ESOPHAGOGASTRODUODENOSCOPY (EGD) WITH PROPOFOL N/A 03/17/2020   Procedure: ESOPHAGOGASTRODUODENOSCOPY (EGD) WITH PROPOFOL;  Surgeon: Earline Mayotte, MD;  Location: [facility] ENDOSCOPY;  Service: Endoscopy;  Laterality: N/A;  also for excision anal tag in endo  . ESOPHAGOGASTRODUODENOSCOPY ENDOSCOPY     2008   . FOOT SURGERY Right 2007  . NECK SURGERY  2016  . TONSILLECTOMY  1974    Family History  Problem Relation Age of Onset  . Hypertension Father   . Heart disease Father   . Prostate cancer Father     SOCIAL HX:  reports that she has never smoked. She has never used smokeless tobacco. She reports current alcohol use. She reports that she does not use drugs.   Current Outpatient Medications:  .  ALPRAZolam (XANAX) 0.5 MG tablet, Take 1 tablet (0.5 mg total) by mouth at bedtime as needed., Disp: 20 tablet, Rfl: 5 .  amphetamine-dextroamphetamine (ADDERALL XR) 30 MG 24 hr capsule, Take 30 mg by mouth., Disp: , Rfl: 0 .  amphetamine-dextroamphetamine (ADDERALL) 30 MG tablet, Take 1 tablet by mouth 2 (two) times daily., Disp: , Rfl: 0 .  benzonatate (TESSALON) 200 MG capsule, Take 1 capsule (200 mg total) by mouth 3 (three)  times daily as needed for cough., Disp: 60 capsule, Rfl: 1 .  butalbital-acetaminophen-caffeine (FIORICET) 50-325-40 MG tablet, Take 1 tablet by mouth every 6 (six) hours as needed for headache., Disp: 60 tablet, Rfl: 1 .  celecoxib (CELEBREX) 200 MG capsule, TAKE 1 CAPSULE BY MOUTH TWICE A DAY, Disp: 60 capsule, Rfl: 5 .  chlorpheniramine-HYDROcodone (TUSSIONEX PENNKINETIC ER) 10-8 MG/5ML SUER, Take 5  mLs by mouth at bedtime as needed., Disp: 140 mL, Rfl: 0 .  conjugated estrogens (PREMARIN) vaginal cream, Place 1 Applicatorful vaginally daily., Disp: 42.5 g, Rfl: 12 .  esomeprazole (NEXIUM) 40 MG capsule, TAKE 1 CAPSULE (40 MG TOTAL) BY MOUTH 2 (TWO) TIMES DAILY BEFORE A MEAL., Disp: 180 capsule, Rfl: 1 .  FLUoxetine (PROZAC) 40 MG capsule, Take 40 mg by mouth daily., Disp: , Rfl:  .  levofloxacin (LEVAQUIN) 500 MG tablet, Take 1 tablet (500 mg total) by mouth daily., Disp: 7 tablet, Rfl: 0 .  losartan (COZAAR) 50 MG tablet, TAKE 1 TABLET BY MOUTH EVERY DAY, Disp: 90 tablet, Rfl: 1 .  ondansetron (ZOFRAN ODT) 4 MG disintegrating tablet, Take 1 tablet (4 mg total) by mouth every 8 (eight) hours as needed for nausea or vomiting., Disp: 20 tablet, Rfl: 0 .  phentermine (ADIPEX-P) 37.5 MG tablet, Take 1 tablet (37.5 mg total) by mouth daily before breakfast., Disp: 30 tablet, Rfl: 2 .  predniSONE (DELTASONE) 10 MG tablet, 6 tablets on Day 1 , then reduce by 1 tablet daily until gone, Disp: 21 tablet, Rfl: 0 .  TANDEM 162-115.2 MG CAPS capsule, TAKE 1 CAPSULE BY MOUTH DAILY WITH BREAKFAST., Disp: 90 capsule, Rfl: 0 .  tiZANidine (ZANAFLEX) 4 MG capsule, TAKE 1 CAPSULE (4 MG TOTAL) BY MOUTH 3 (THREE) TIMES DAILY AS NEEDED FOR MUSCLE SPASMS., Disp: 270 capsule, Rfl: 1 .  traMADol (ULTRAM) 50 MG tablet, Take 1 tablet (50 mg total) by mouth every 8 (eight) hours as needed., Disp: 30 tablet, Rfl: 0 .  traZODone (DESYREL) 50 MG tablet, TAKE 0.5-1 TABLETS (25-50 MG TOTAL) BY MOUTH AT BEDTIME AS NEEDED FOR SLEEP. (NEED APPT), Disp: 90 tablet, Rfl: 1 .  YUVAFEM 10 MCG TABS vaginal tablet, INSERT ONE TABLET VAGINALLY NIGHTLY FOR 2 WEEKS, THEN WEEKLY THEREAFTER, Disp: 48 tablet, Rfl: 1 .  cyanocobalamin (,VITAMIN B-12,) 1000 MCG/ML injection, INJECT 1 ML (1,000 MCG TOTAL) INTO THE MUSCLE EVERY 30 (THIRTY) DAYS. (Patient not taking: Reported on 01/06/2021), Disp: 3 mL, Rfl: 3 .  HYDROcodone-acetaminophen  (NORCO/VICODIN) 5-325 MG tablet, Take by mouth. (Patient not taking: Reported on 01/06/2021), Disp: , Rfl:   EXAM:  VITALS per patient if applicable:  GENERAL: alert, oriented, appears well and in no acute distress  HEENT: atraumatic, conjunttiva clear, no obvious abnormalities on inspection of external nose and ears  NECK: normal movements of the head and neck  LUNGS: on inspection no signs of respiratory distress, breathing rate appears normal, no obvious gross SOB, gasping or wheezing  CV: no obvious cyanosis  MS: moves all visible extremities without noticeable abnormality  PSYCH/NEURO: pleasant and cooperative, no obvious depression or anxiety, speech and thought processing grossly intact  ASSESSMENT AND PLAN:  Discussed the following assessment and plan:  COVID-19 virus infection  COVID-19 virus infection In an unvaccinated individural with risk factors of hypertension , OSA and overweight.  Has failed to improve using ivermectin and doxycycline prescribed by community physician. She has persistent fevers and new onset sinusitis.  Levaquin, prednisone and cough suppressant along with rx for zofran for  nausea sent to pharmacy     I discussed the assessment and treatment plan with the patient. The patient was provided an opportunity to ask questions and all were answered. The patient agreed with the plan and demonstrated an understanding of the instructions.   The patient was advised to call back or seek an in-person evaluation if the symptoms worsen or if the condition fails to improve as anticipated.    I provided  30 minutes  during this encounter reviewing patient's current problems and past surgeries, labs and imaging studies, providing counseling on the above mentioned problems I n a face to face visit  , and coordination  of care . Sherlene Shams, MD

## 2021-01-06 NOTE — Patient Instructions (Signed)
I have prescribed  the zofran for the nausea and vomiting and sent it to pharmacy   Levaquin 5000 mg daily for the infection sinuses,  Ears)  Prednisone for the rash and inflammation  (6-5-4-3-2-1- tapering dose)   2 cough suppressants:  benzonotate capsules for daytime cough  Tussionex for nighttime cough  (this has liquid vicodin in it so do not combine with tramadol or alcohol!)

## 2021-01-10 ENCOUNTER — Other Ambulatory Visit: Payer: Self-pay | Admitting: Internal Medicine

## 2021-01-28 ENCOUNTER — Other Ambulatory Visit: Payer: Self-pay | Admitting: Internal Medicine

## 2021-02-16 ENCOUNTER — Other Ambulatory Visit: Payer: Self-pay | Admitting: Internal Medicine

## 2021-02-20 ENCOUNTER — Other Ambulatory Visit: Payer: Self-pay | Admitting: Internal Medicine

## 2021-03-22 ENCOUNTER — Other Ambulatory Visit: Payer: Self-pay | Admitting: Internal Medicine

## 2021-03-28 ENCOUNTER — Telehealth: Payer: Self-pay | Admitting: Internal Medicine

## 2021-03-28 NOTE — Telephone Encounter (Signed)
Patient called and wanted to know if Dr Darrick Huntsman could take out her stitches that are in her face. Injury happened at her work place and went to a urgent care because of workmens comp. Patient was advised that she might have to go back to where she was seen, to have stitches removed since it is under work [employer]. She said she did not want to travel back to [facility].

## 2021-03-28 NOTE — Telephone Encounter (Signed)
Can you schedule this for me

## 2021-03-28 NOTE — Telephone Encounter (Signed)
Yes I will take out her stitches if I have an appt

## 2021-03-28 NOTE — Telephone Encounter (Signed)
Called and lm on vm to call office to make an appointment with Dr. Darrick Huntsman to remove stitches.

## 2021-06-03 ENCOUNTER — Other Ambulatory Visit: Payer: Self-pay | Admitting: Internal Medicine

## 2021-06-04 ENCOUNTER — Other Ambulatory Visit: Payer: Self-pay | Admitting: Internal Medicine

## 2021-06-07 ENCOUNTER — Other Ambulatory Visit: Payer: Self-pay | Admitting: Internal Medicine

## 2021-06-19 ENCOUNTER — Other Ambulatory Visit: Payer: Self-pay | Admitting: Internal Medicine

## 2021-08-11 ENCOUNTER — Other Ambulatory Visit: Payer: Self-pay | Admitting: Internal Medicine

## 2021-10-08 ENCOUNTER — Other Ambulatory Visit: Payer: Self-pay | Admitting: Internal Medicine

## 2021-10-10 NOTE — Telephone Encounter (Signed)
ONE TIME COURTESY REFILL.  NEEDS APPT

## 2021-10-10 NOTE — Telephone Encounter (Signed)
RX Refill: fioricet Last Seen: 01-06-21 Last Ordered: 12-26-19 Next Appt: NA

## 2021-11-15 ENCOUNTER — Other Ambulatory Visit: Payer: Self-pay | Admitting: Internal Medicine

## 2022-02-23 ENCOUNTER — Other Ambulatory Visit: Payer: Self-pay | Admitting: Internal Medicine

## 2022-02-23 DIAGNOSIS — Z1231 Encounter for screening mammogram for malignant neoplasm of breast: Secondary | ICD-10-CM

## 2022-03-21 ENCOUNTER — Other Ambulatory Visit: Payer: Self-pay | Admitting: Internal Medicine

## 2022-03-21 NOTE — Telephone Encounter (Signed)
LMTCB to schedule an appointment for refill to be done. ?

## 2022-03-31 ENCOUNTER — Other Ambulatory Visit: Payer: Self-pay | Admitting: Internal Medicine

## 2022-04-03 ENCOUNTER — Ambulatory Visit
Admission: RE | Admit: 2022-04-03 | Discharge: 2022-04-03 | Disposition: A | Discharge status: Home / Self Care | Payer: BC Managed Care – PPO | Source: Ambulatory Visit | Attending: Internal Medicine | Admitting: Internal Medicine

## 2022-04-03 DIAGNOSIS — Z1231 Encounter for screening mammogram for malignant neoplasm of breast: Secondary | ICD-10-CM

## 2022-04-06 ENCOUNTER — Encounter: Payer: Self-pay | Admitting: Internal Medicine

## 2022-04-06 ENCOUNTER — Ambulatory Visit: Payer: BC Managed Care – PPO | Admitting: Internal Medicine

## 2022-04-06 VITALS — BP 152/88 | HR 109 | Temp 98.3°F | Ht 62.0 in | Wt 151.4 lb

## 2022-04-06 DIAGNOSIS — Z79899 Other long term (current) drug therapy: Secondary | ICD-10-CM

## 2022-04-06 DIAGNOSIS — R7301 Impaired fasting glucose: Secondary | ICD-10-CM

## 2022-04-06 DIAGNOSIS — I10 Essential (primary) hypertension: Secondary | ICD-10-CM | POA: Diagnosis not present

## 2022-04-06 DIAGNOSIS — G43009 Migraine without aura, not intractable, without status migrainosus: Secondary | ICD-10-CM

## 2022-04-06 DIAGNOSIS — E785 Hyperlipidemia, unspecified: Secondary | ICD-10-CM | POA: Diagnosis not present

## 2022-04-06 DIAGNOSIS — G4733 Obstructive sleep apnea (adult) (pediatric): Secondary | ICD-10-CM

## 2022-04-06 DIAGNOSIS — D510 Vitamin B12 deficiency anemia due to intrinsic factor deficiency: Secondary | ICD-10-CM

## 2022-04-06 DIAGNOSIS — E663 Overweight: Secondary | ICD-10-CM

## 2022-04-06 LAB — CBC WITH DIFFERENTIAL/PLATELET
Basophils Absolute: 0 10*3/uL (ref 0.0–0.1)
Basophils Relative: 0.8 % (ref 0.0–3.0)
Eosinophils Absolute: 0.2 10*3/uL (ref 0.0–0.7)
Eosinophils Relative: 3 % (ref 0.0–5.0)
HCT: 37.5 % (ref 36.0–46.0)
Hemoglobin: 12.4 g/dL (ref 12.0–15.0)
Lymphocytes Relative: 25.1 % (ref 12.0–46.0)
Lymphs Abs: 1.4 10*3/uL (ref 0.7–4.0)
MCHC: 33 g/dL (ref 30.0–36.0)
MCV: 91.5 fl (ref 78.0–100.0)
Monocytes Absolute: 0.4 10*3/uL (ref 0.1–1.0)
Monocytes Relative: 7.1 % (ref 3.0–12.0)
Neutro Abs: 3.5 10*3/uL (ref 1.4–7.7)
Neutrophils Relative %: 64 % (ref 43.0–77.0)
Platelets: 409 10*3/uL — ABNORMAL HIGH (ref 150.0–400.0)
RBC: 4.1 Mil/uL (ref 3.87–5.11)
RDW: 13.8 % (ref 11.5–15.5)
WBC: 5.5 10*3/uL (ref 4.0–10.5)

## 2022-04-06 LAB — COMPREHENSIVE METABOLIC PANEL
ALT: 14 U/L (ref 0–35)
AST: 18 U/L (ref 0–37)
Albumin: 4.6 g/dL (ref 3.5–5.2)
Alkaline Phosphatase: 52 U/L (ref 39–117)
BUN: 11 mg/dL (ref 6–23)
CO2: 28 mEq/L (ref 19–32)
Calcium: 9.4 mg/dL (ref 8.4–10.5)
Chloride: 103 mEq/L (ref 96–112)
Creatinine, Ser: 0.75 mg/dL (ref 0.40–1.20)
GFR: 87.22 mL/min (ref >60.00)
Glucose, Bld: 104 mg/dL — ABNORMAL HIGH (ref 70–99)
Potassium: 3.7 mEq/L (ref 3.5–5.1)
Sodium: 140 mEq/L (ref 135–145)
Total Bilirubin: 0.5 mg/dL (ref 0.2–1.2)
Total Protein: 7 g/dL (ref 6.0–8.3)

## 2022-04-06 LAB — TSH: TSH: 1.36 u[IU]/mL (ref 0.35–5.50)

## 2022-04-06 LAB — LIPID PANEL
Cholesterol: 194 mg/dL (ref 0–200)
HDL: 76.4 mg/dL (ref >39.00)
LDL Cholesterol: 105 mg/dL — ABNORMAL HIGH (ref 0–99)
NonHDL: 117.14
Total CHOL/HDL Ratio: 3
Triglycerides: 62 mg/dL (ref 0.0–149.0)
VLDL: 12.4 mg/dL (ref 0.0–40.0)

## 2022-04-06 LAB — LDL CHOLESTEROL, DIRECT: Direct LDL: 97 mg/dL

## 2022-04-06 LAB — HEMOGLOBIN A1C: Hgb A1c MFr Bld: 5.5 % (ref 4.6–6.5)

## 2022-04-06 LAB — MICROALBUMIN / CREATININE URINE RATIO
Creatinine,U: 116.1 mg/dL
Microalb Creat Ratio: 0.6 mg/g (ref 0.0–30.0)
Microalb, Ur: 0.7 mg/dL (ref 0.0–1.9)

## 2022-04-06 MED ORDER — METOPROLOL SUCCINATE ER 25 MG PO TB24: 25.0000 mg | ORAL_TABLET | Freq: Every day | ORAL | 3 refills | Status: DC

## 2022-04-06 MED ORDER — TIZANIDINE HCL 4 MG PO CAPS: ORAL_CAPSULE | ORAL | 1 refills | Status: DC

## 2022-04-06 MED ORDER — BUTALBITAL-APAP-CAFFEINE 50-325-40 MG PO TABS: 1.0000 | ORAL_TABLET | Freq: Four times a day (QID) | ORAL | 5 refills | Status: DC | PRN

## 2022-04-06 NOTE — Patient Instructions (Addendum)
?  You can add up to 2000 mg of acetominophen (tylenol) every day safely  In divided doses (500 mg every 6 hours  Or 1000 mg every 12 hours.)  ? ?I am refilling the fioricet because it works well for headaches and arthritis.  ? ?If you use the fioricet, on a regular basis (daily),   reduce the tylenol use because fioricet contains tylenol ? ? ?I am adding metoprolol at bedtime for 1) hypertension 2 ) pounding heart 3) frequent headaches ? ?Goal bp is 130/80 or less  ?

## 2022-04-06 NOTE — Progress Notes (Addendum)
Subjective:  Patient ID: Destiny Martin, female    DOB: [birth year]  Age: 61 y.o. MRN: [id number]  CC: The primary encounter diagnosis was Primary hypertension. Diagnoses of Impaired fasting glucose, Hyperlipidemia, unspecified hyperlipidemia type, Long-term use of high-risk medication, Pernicious anemia, Overweight (BMI 25.0-29.9), Obstructive sleep apnea, and Migraine without aura and without status migrainosus, not intractable were also pertinent to this visit.   This visit occurred during the SARS-CoV-2 public health emergency.  Safety protocols were in place, including screening questions prior to the visit, additional usage of staff PPE, and extensive cleaning of exam room while observing appropriate contact time as indicated for disinfecting solutions.    HPI Destiny Martin presents for refills on medications .  Her last virtual visit was February 2022, and her last  in person visit June 2021 Chief Complaint  Patient presents with   Follow-up    Follow up   Patent requesting refills on controlled substances:  alprazolam, phentermine and fioricet   Refill history confirmed via [place] Controlled Substance databas, accessed by me today..  there have been no refills for the past 12 months,  however patient takes adderall daily  prescribed by Dr Toy Care. Advised to discuss alprazolam use with Toy Care  .   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  Mother had a heart attack in [place] , was hospitalized and misdiagnosed.  Now living here in [place]    Overweight:  wants to lose weight,  advised that phentermine could not be used concurrently  with adderall   Elevated BP :  has had it checked it urgent care and was 130/80 3 months ago    Outpatient Medications Prior to Visit  Medication Sig Dispense Refill   ALPRAZolam (XANAX) 0.5 MG tablet Take 1 tablet (0.5 mg total) by mouth at bedtime as needed. 20 tablet 5   amphetamine-dextroamphetamine (ADDERALL XR) 30 MG 24 hr capsule Take 30 mg by  mouth.  0   amphetamine-dextroamphetamine (ADDERALL) 30 MG tablet Take 1 tablet by mouth 2 (two) times daily.  0   celecoxib (CELEBREX) 200 MG capsule TAKE 1 CAPSULE BY MOUTH TWICE A DAY 60 capsule 5   conjugated estrogens (PREMARIN) vaginal cream Place 1 Applicatorful vaginally daily. 42.5 g 12   cyanocobalamin (,VITAMIN B-12,) 1000 MCG/ML injection INJECT 1 ML (1,000 MCG TOTAL) INTO THE MUSCLE EVERY 30 (THIRTY) DAYS. 3 mL 3   esomeprazole (NEXIUM) 40 MG capsule TAKE 1 CAPSULE (40 MG TOTAL) BY MOUTH 2 (TWO) TIMES DAILY BEFORE A MEAL. 180 capsule 1   FLUoxetine (PROZAC) 40 MG capsule Take 40 mg by mouth daily.     losartan (COZAAR) 50 MG tablet TAKE 1 TABLET BY MOUTH EVERY DAY 90 tablet 1   ondansetron (ZOFRAN-ODT) 4 MG disintegrating tablet TAKE 1 TABLET BY MOUTH EVERY 8 HOURS AS NEEDED FOR NAUSEA AND VOMITING 18 tablet 1   phentermine (ADIPEX-P) 37.5 MG tablet Take 1 tablet (37.5 mg total) by mouth daily before breakfast. 30 tablet 2   TANDEM 162-115.2 MG CAPS capsule TAKE 1 CAPSULE BY MOUTH DAILY WITH BREAKFAST. 90 capsule 0   traMADol (ULTRAM) 50 MG tablet Take 1 tablet (50 mg total) by mouth every 8 (eight) hours as needed. 30 tablet 0   traZODone (DESYREL) 50 MG tablet TAKE 0.5-1 TABLETS (25-50 MG TOTAL) BY MOUTH AT BEDTIME AS NEEDED FOR SLEEP. (NEED APPT) 270 tablet 1   YUVAFEM 10 MCG TABS vaginal tablet INSERT ONE TABLET VAGINALLY NIGHTLY FOR 2 WEEKS, THEN  WEEKLY THEREAFTER 48 tablet 1   butalbital-acetaminophen-caffeine (FIORICET) 50-325-40 MG tablet TAKE 1 TABLET BY MOUTH EVERY 6 (SIX) HOURS AS NEEDED FOR HEADACHE. 30 tablet 0   HYDROcodone-acetaminophen (NORCO/VICODIN) 5-325 MG tablet Take by mouth.     tiZANidine (ZANAFLEX) 4 MG capsule TAKE 1 CAPSULE BY MOUTH 3 TIMES DAILY AS NEEDED FOR MUSCLE SPASMS. 270 capsule 1   benzonatate (TESSALON) 200 MG capsule Take 1 capsule (200 mg total) by mouth 3 (three) times daily as needed for cough. (Patient not taking: Reported on 04/06/2022) 60  capsule 1   chlorpheniramine-HYDROcodone (TUSSIONEX PENNKINETIC ER) 10-8 MG/5ML SUER Take 5 mLs by mouth at bedtime as needed. (Patient not taking: Reported on 04/06/2022) 140 mL 0   levofloxacin (LEVAQUIN) 500 MG tablet Take 1 tablet (500 mg total) by mouth daily. (Patient not taking: Reported on 04/06/2022) 7 tablet 0   predniSONE (DELTASONE) 10 MG tablet 6 tablets on Day 1 , then reduce by 1 tablet daily until gone (Patient not taking: Reported on 04/06/2022) 21 tablet 0   No facility-administered medications prior to visit.    Review of Systems;  Patient denies headache, fevers, malaise, unintentional weight loss, skin rash, eye pain, sinus congestion and sinus pain, sore throat, dysphagia,  hemoptysis , cough, dyspnea, wheezing, chest pain, palpitations, orthopnea, edema, abdominal pain, nausea, melena, diarrhea, constipation, flank pain, dysuria, hematuria, urinary  Frequency, nocturia, numbness, tingling, seizures,  Focal weakness, Loss of consciousness,  Tremor, insomnia, depression, anxiety, and suicidal ideation.      Objective:  BP (!) 152/88 (BP Location: Left Arm, Patient Position: Sitting, Cuff Size: Normal)   Pulse (!) 109   Temp 98.3 F (36.8 C) (Oral)   Ht _0  (1.575 m)   Wt 151 lb 6.4 oz (68.7 kg)   LMP 01/15/2018 (Approximate)   SpO2 98%   BMI 27.69 kg/m   BP Readings from Last 3 Encounters:  04/06/22 (!) 152/88  05/05/20 132/90  03/17/20 111/78    Wt Readings from Last 3 Encounters:  04/06/22 151 lb 6.4 oz (68.7 kg)  01/06/21 145 lb (65.8 kg)  05/05/20 154 lb (69.9 kg)    General appearance: alert, cooperative and appears stated age Ears: normal TM's and external ear canals both ears Throat: lips, mucosa, and tongue normal; teeth and gums normal Neck: no adenopathy, no carotid bruit, supple, symmetrical, trachea midline and thyroid not enlarged, symmetric, no tenderness/mass/nodules Back: symmetric, no curvature. ROM normal. No CVA tenderness. Lungs: clear  to auscultation bilaterally Heart: regular rate and rhythm, S1, S2 normal, no murmur, click, rub or gallop Abdomen: soft, non-tender; bowel sounds normal; no masses,  no organomegaly Pulses: 2+ and symmetric Skin: Skin color, texture, turgor normal. No rashes or lesions Lymph nodes: Cervical, supraclavicular, and axillary nodes normal.  Lab Results  Component Value Date   HGBA1C 5.5 04/06/2022    Lab Results  Component Value Date   CREATININE 0.75 04/06/2022   CREATININE 0.80 12/29/2019   CREATININE 0.83 06/10/2018    Lab Results  Component Value Date   WBC 5.5 04/06/2022   HGB 12.4 04/06/2022   HCT 37.5 04/06/2022   PLT 409.0 (H) 04/06/2022   GLUCOSE 104 (H) 04/06/2022   CHOL 194 04/06/2022   TRIG 62.0 04/06/2022   HDL 76.40 04/06/2022   LDLDIRECT 97.0 04/06/2022   LDLCALC 105 (H) 04/06/2022   ALT 14 04/06/2022   AST 18 04/06/2022   NA 140 04/06/2022   K 3.7 04/06/2022   CL 103 04/06/2022   CREATININE  0.75 04/06/2022   BUN 11 04/06/2022   CO2 28 04/06/2022   TSH 1.36 04/06/2022   HGBA1C 5.5 04/06/2022   MICROALBUR 0.7 04/06/2022    MM 3D SCREEN BREAST BILATERAL  Result Date: 04/03/2022 CLINICAL DATA:  Screening. EXAM: DIGITAL SCREENING BILATERAL MAMMOGRAM WITH TOMOSYNTHESIS AND CAD TECHNIQUE: Bilateral screening digital craniocaudal and mediolateral oblique mammograms were obtained. Bilateral screening digital breast tomosynthesis was performed. The images were evaluated with computer-aided detection. COMPARISON:  Previous exam(s). ACR Breast Density Category b: There are scattered areas of fibroglandular density. FINDINGS: There are no findings suspicious for malignancy. IMPRESSION: No mammographic evidence of malignancy. A result letter of this screening mammogram will be mailed directly to the patient. RECOMMENDATION: Screening mammogram in one year. (Code:SM-B-01Y) BI-RADS CATEGORY  1: Negative. Electronically Signed   By: Zerita Boers M.D.   On: 04/03/2022 16:15     Assessment & Plan:   Problem List Items Addressed This Visit     Hypertension - Primary    Her BP hhas not been  Well controlled on losartan 50 mg daily.  Today I am adding metoprolol for concurrent headaches and palpitations.       Relevant Medications   metoprolol succinate (TOPROL-XL) 25 MG 24 hr tablet   Other Relevant Orders   Comp Met (CMET) (Completed)   Urine Microalbumin w/creat. ratio (Completed)   Migraine headache    Hear headaches have been more frequent de to job stressors.  Adding toprol xl        Relevant Medications   tiZANidine (ZANAFLEX) 4 MG capsule   metoprolol succinate (TOPROL-XL) 25 MG 24 hr tablet   butalbital-acetaminophen-caffeine (FIORICET) 50-325-40 MG tablet   Obstructive sleep apnea    She is not  wearing CPAP . Sleeping well without it ; however her blood pressure is elevated.        Overweight (BMI 25.0-29.9)    Reviewed her previous success at weight loss,  Her goal weight for BMI < 25 (130  lbs). I have discussed the various pharmacologic treatments along with the risks and benefits of each medication.  Phentermine is not an option due to concurrent use of stimulants for ADD.  Recommending low GI diet and exercise , return I n 3 months to consider metformin vs wegovy vs contrave         Pernicious anemia    Her anemia  resolved with B12 supplementation, annual labs are normal    Lab Results  Component Value Date   WBC 5.5 04/06/2022   HGB 12.4 04/06/2022   HCT 37.5 04/06/2022   MCV 91.5 04/06/2022   PLT 409.0 (H) 04/06/2022         Other Visit Diagnoses     Impaired fasting glucose       Relevant Orders   Comp Met (CMET) (Completed)   HgB A1c (Completed)   Urine Microalbumin w/creat. ratio (Completed)   Hyperlipidemia, unspecified hyperlipidemia type       Relevant Medications   metoprolol succinate (TOPROL-XL) 25 MG 24 hr tablet   Other Relevant Orders   Lipid panel (Completed)   Direct LDL (Completed)    Long-term use of high-risk medication       Relevant Orders   CBC with Differential/Platelet (Completed)   TSH (Completed)       I spent a total of  38 minutes with this patient in a face to face visit on the date of this encounter reviewing  her last video visit,  her last in  person visit  prior strategies that were successful in achieving weight loss.  Her current diet and eating habits     and post visit ordering of testing and therapeutics.    Follow-up: Return in about 6 months (around 10/07/2022).   Crecencio Mc, MD

## 2022-04-08 NOTE — Assessment & Plan Note (Addendum)
She is not  wearing CPAP . Sleeping well without it ; however her blood pressure is elevated.  ?

## 2022-04-08 NOTE — Assessment & Plan Note (Signed)
Her anemia  resolved with B12 supplementation, annual labs are normal  ?  ?Lab Results  ?Component Value Date  ? WBC 5.5 04/06/2022  ? HGB 12.4 04/06/2022  ? HCT 37.5 04/06/2022  ? MCV 91.5 04/06/2022  ? PLT 409.0 (H) 04/06/2022  ? ? ?

## 2022-04-08 NOTE — Assessment & Plan Note (Signed)
Hear headaches have been more frequent de to job stressors.  Adding toprol xl  ?

## 2022-04-08 NOTE — Assessment & Plan Note (Addendum)
Reviewed her previous success at weight loss,  Her goal weight for BMI < 25 (130  lbs). I have discussed the various pharmacologic treatments along with the risks and benefits of each medication.  Phentermine is not an option due to concurrent use of stimulants for ADD.  Recommending low GI diet and exercise , return I n 3 months to consider metformin vs wegovy vs contrave

## 2022-04-08 NOTE — Assessment & Plan Note (Addendum)
Her BP hhas not been  Well controlled on losartan 50 mg daily.  Today I am adding metoprolol for concurrent headaches and palpitations.

## 2022-06-21 ENCOUNTER — Encounter: Payer: Self-pay | Admitting: Internal Medicine

## 2022-06-22 ENCOUNTER — Other Ambulatory Visit: Payer: Self-pay | Admitting: Internal Medicine

## 2022-06-23 ENCOUNTER — Other Ambulatory Visit: Payer: Self-pay | Admitting: Internal Medicine

## 2022-06-23 MED ORDER — METFORMIN HCL ER 500 MG PO TB24: 500.0000 mg | ORAL_TABLET | Freq: Every day | ORAL | 1 refills | Status: DC

## 2022-06-23 MED ORDER — CIPROFLOXACIN HCL 250 MG PO TABS: 250.0000 mg | ORAL_TABLET | Freq: Two times a day (BID) | ORAL | 0 refills | Status: AC

## 2022-07-30 ENCOUNTER — Other Ambulatory Visit: Payer: Self-pay | Admitting: Internal Medicine

## 2022-10-09 ENCOUNTER — Encounter: Payer: Self-pay | Admitting: Internal Medicine

## 2022-10-09 ENCOUNTER — Ambulatory Visit: Payer: BC Managed Care – PPO | Admitting: Internal Medicine

## 2022-10-09 VITALS — BP 140/92 | HR 85 | Temp 98.2°F | Ht 62.0 in | Wt 143.5 lb

## 2022-10-09 DIAGNOSIS — K219 Gastro-esophageal reflux disease without esophagitis: Secondary | ICD-10-CM

## 2022-10-09 DIAGNOSIS — D510 Vitamin B12 deficiency anemia due to intrinsic factor deficiency: Secondary | ICD-10-CM

## 2022-10-09 DIAGNOSIS — I10 Essential (primary) hypertension: Secondary | ICD-10-CM

## 2022-10-09 DIAGNOSIS — R030 Elevated blood-pressure reading, without diagnosis of hypertension: Secondary | ICD-10-CM

## 2022-10-09 DIAGNOSIS — M47812 Spondylosis without myelopathy or radiculopathy, cervical region: Secondary | ICD-10-CM | POA: Diagnosis not present

## 2022-10-09 DIAGNOSIS — M19041 Primary osteoarthritis, right hand: Secondary | ICD-10-CM

## 2022-10-09 DIAGNOSIS — E559 Vitamin D deficiency, unspecified: Secondary | ICD-10-CM | POA: Diagnosis not present

## 2022-10-09 DIAGNOSIS — Z23 Encounter for immunization: Secondary | ICD-10-CM

## 2022-10-09 DIAGNOSIS — M19042 Primary osteoarthritis, left hand: Secondary | ICD-10-CM

## 2022-10-09 LAB — VITAMIN B12: Vitamin B-12: 516 pg/mL (ref 211–911)

## 2022-10-09 LAB — VITAMIN D 25 HYDROXY (VIT D DEFICIENCY, FRACTURES): VITD: 40.4 ng/mL (ref 30.00–100.00)

## 2022-10-09 MED ORDER — ALPRAZOLAM 0.5 MG PO TABS: 0.5000 mg | ORAL_TABLET | Freq: Every evening | ORAL | 5 refills | Status: DC | PRN

## 2022-10-09 MED ORDER — BUTALBITAL-APAP-CAFFEINE 50-325-40 MG PO TABS: 1.0000 | ORAL_TABLET | Freq: Four times a day (QID) | ORAL | 5 refills | Status: DC | PRN

## 2022-10-09 MED ORDER — TRAMADOL HCL 50 MG PO TABS: 50.0000 mg | ORAL_TABLET | Freq: Three times a day (TID) | ORAL | 0 refills | Status: DC | PRN

## 2022-10-09 MED ORDER — ESOMEPRAZOLE MAGNESIUM 40 MG PO CPDR: 40.0000 mg | DELAYED_RELEASE_CAPSULE | Freq: Every morning | ORAL | 1 refills | Status: DC

## 2022-10-09 MED ORDER — TRAMADOL HCL 50 MG PO TABS: 50.0000 mg | ORAL_TABLET | Freq: Three times a day (TID) | ORAL | 5 refills | Status: DC | PRN

## 2022-10-09 NOTE — Assessment & Plan Note (Signed)
No medications needed at this time based on home readings and syncopal event during prior trial

## 2022-10-09 NOTE — Assessment & Plan Note (Signed)
S/p surgical decompression Dec 2016 .  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

## 2022-10-09 NOTE — Assessment & Plan Note (Signed)
Continue nexium  advised to stop use of celebrex due to evidence of gastric erosions on prior EGD

## 2022-10-09 NOTE — Progress Notes (Signed)
Subjective:  Patient ID: Destiny Martin, female    DOB: 02-Jan-1961  Age: 61 y.o. MRN: [id number]  CC: The primary encounter diagnosis was Primary hypertension. Diagnoses of Pernicious anemia, White coat syndrome without diagnosis of hypertension, Cervical spondylosis without myelopathy, Vitamin D deficiency, Primary osteoarthritis of both hands, Need for influenza vaccination, and Gastroesophageal reflux disease without esophagitis were also pertinent to this visit.   HPI HADLEE BURBACK presents for  Chief Complaint  Patient presents with   Follow-up    6 month follow up-   1) Hypertension:  seen in  May with elevated BP,  recurrent headaches and palpitations. She developed symptomatic hypotension (systolic in 70's) after metoprolol 25 mg was added to losartan 50 mg . Occurred after  the 2nd or 3rd dose.  Both medications were stopped.  She has been checking her BP at home , most recent ws 128/76 without medication .  The headaches are from her cervical spine DDD  and have been aggravated by her heavy lifting at her side job. 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  Uses butalbital and tramadol when tylenol not helpful.  History of prior ESI by [facility]. Needs referral    3) anemia:  b12 and iron deficient , was taking supplements until September,  then stopped.  No vitamin D supplementation either.  Fatigue worsening   4) overweight  has lost 15 lbs by restricting her diet .  Working 2 extra part time  jobs. 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 Dr  Evelene Croon. Symptoms controlled   6) Gastritis:  reviewed 2021 EGD:  gastric erosions were noted.  Taking PPI,  using celebrex prn   Outpatient Medications Prior to Visit  Medication Sig Dispense Refill   amphetamine-dextroamphetamine (ADDERALL XR) 30 MG 24 hr capsule Take  30 mg by mouth.  0   amphetamine-dextroamphetamine (ADDERALL) 30 MG tablet Take 1 tablet by mouth 2 (two) times daily.  0   conjugated estrogens (PREMARIN) vaginal cream Place 1 Applicatorful vaginally daily. 42.5 g 12   cyanocobalamin (,VITAMIN B-12,) 1000 MCG/ML injection INJECT 1 ML (1,000 MCG TOTAL) INTO THE MUSCLE EVERY 30 DAYS. 3 mL 3   FLUoxetine (PROZAC) 40 MG capsule Take 40 mg by mouth daily.     ondansetron (ZOFRAN-ODT) 4 MG disintegrating tablet TAKE 1 TABLET BY MOUTH EVERY 8 HOURS AS NEEDED FOR NAUSEA AND VOMITING 18 tablet 1   TANDEM 162-115.2 MG CAPS capsule TAKE 1 CAPSULE BY MOUTH DAILY WITH BREAKFAST. 90 capsule 0   tiZANidine (ZANAFLEX) 4 MG capsule TAKE 1 CAPSULE BY MOUTH 3 TIMES DAILY AS NEEDED FOR MUSCLE SPASMS. 180 capsule 1   traZODone (DESYREL) 50 MG tablet TAKE 0.5-1 TABLETS (25-50 MG TOTAL) BY MOUTH AT BEDTIME AS NEEDED FOR SLEEP. (NEED APPT) 270 tablet 1   VAGIFEM 10 MCG TABS vaginal tablet INSERT ONE TABLET VAGINALLY NIGHTLY FOR 2 WEEKS, THEN WEEKLY THEREAFTER 24 tablet 3   ALPRAZolam (XANAX) 0.5 MG tablet Take 1 tablet (0.5 mg total) by mouth at bedtime as needed. 20 tablet 5   butalbital-acetaminophen-caffeine (FIORICET) 50-325-40 MG tablet Take 1 tablet by mouth every 6 (six) hours as needed for headache. 60 tablet 5   celecoxib (CELEBREX) 200 MG capsule TAKE 1 CAPSULE BY MOUTH TWICE A DAY 60 capsule 5  esomeprazole (NEXIUM) 40 MG capsule TAKE 1 CAPSULE (40 MG TOTAL) BY MOUTH 2 (TWO) TIMES DAILY BEFORE A MEAL. 180 capsule 1   traMADol (ULTRAM) 50 MG tablet Take 1 tablet (50 mg total) by mouth every 8 (eight) hours as needed. 30 tablet 0   metFORMIN (GLUCOPHAGE-XR) 500 MG 24 hr tablet Take 1 tablet (500 mg total) by mouth daily with breakfast. (Patient not taking: Reported on 10/09/2022) 90 tablet 1   losartan (COZAAR) 50 MG tablet TAKE 1 TABLET BY MOUTH EVERY DAY (Patient not taking: Reported on 10/09/2022) 90 tablet 1   metoprolol succinate (TOPROL-XL) 25 MG 24 hr  tablet Take 1 tablet (25 mg total) by mouth daily. (Patient not taking: Reported on 10/09/2022) 90 tablet 3   No facility-administered medications prior to visit.    Review of Systems;  Patient denies headache, fevers, malaise, unintentional weight loss, skin rash, eye pain, sinus congestion and sinus pain, sore throat, dysphagia,  hemoptysis , cough, dyspnea, wheezing, chest pain, palpitations, orthopnea, edema, abdominal pain, nausea, melena, diarrhea, constipation, flank pain, dysuria, hematuria, urinary  Frequency, nocturia, numbness, tingling, seizures,  Focal weakness, Loss of consciousness,  Tremor, insomnia, depression, anxiety, and suicidal ideation.      Objective:  BP (!) 140/92 (BP Location: Left Arm, Patient Position: Sitting, Cuff Size: Normal)   Pulse 85   Temp 98.2 F (36.8 C) (Oral)   Ht 5\' 2"  (1.575 m)   Wt 143 lb 8 oz (65.1 kg)   LMP 01/15/2018 (Approximate)   SpO2 97%   BMI 26.25 kg/m   BP Readings from Last 3 Encounters:  10/09/22 (!) 140/92  04/06/22 (!) 152/88  05/05/20 132/90    Wt Readings from Last 3 Encounters:  10/09/22 143 lb 8 oz (65.1 kg)  04/06/22 151 lb 6.4 oz (68.7 kg)  01/06/21 145 lb (65.8 kg)    General appearance: alert, cooperative and appears stated age Ears: normal TM's and external ear canals both ears Throat: lips, mucosa, and tongue normal; teeth and gums normal Neck: no adenopathy, no carotid bruit, supple, symmetrical, trachea midline and thyroid not enlarged, symmetric, no tenderness/mass/nodules Back: symmetric, no curvature. ROM normal. No CVA tenderness. Lungs: clear to auscultation bilaterally Heart: regular rate and rhythm, S1, S2 normal, no murmur, click, rub or gallop Abdomen: soft, non-tender; bowel sounds normal; no masses,  no organomegaly Pulses: 2+ and symmetric Skin: Skin color, texture, turgor normal. No rashes or lesions Lymph nodes: Cervical, supraclavicular, and axillary nodes normal. Neuro:  awake and  interactive with normal mood and affect. Higher cortical functions are normal. Speech is clear without word-finding difficulty or dysarthria. Extraocular movements are intact. Visual fields of both eyes are grossly intact. Sensation to light touch is grossly intact bilaterally of upper and lower extremities. Motor examination shows 4+/5 symmetric hand grip and upper extremity and 5/5 lower extremity strength. There is no pronation or drift. Gait is non-ataxic   Lab Results  Component Value Date   HGBA1C 5.5 04/06/2022    Lab Results  Component Value Date   CREATININE 0.75 04/06/2022   CREATININE 0.80 12/29/2019   CREATININE 0.83 06/10/2018    Lab Results  Component Value Date   WBC 5.5 04/06/2022   HGB 12.4 04/06/2022   HCT 37.5 04/06/2022   PLT 409.0 (H) 04/06/2022   GLUCOSE 104 (H) 04/06/2022   CHOL 194 04/06/2022   TRIG 62.0 04/06/2022   HDL 76.40 04/06/2022   LDLDIRECT 97.0 04/06/2022   LDLCALC 105 (H) 04/06/2022   ALT  14 04/06/2022   AST 18 04/06/2022   NA 140 04/06/2022   K 3.7 04/06/2022   CL 103 04/06/2022   CREATININE 0.75 04/06/2022   BUN 11 04/06/2022   CO2 28 04/06/2022   TSH 1.36 04/06/2022   HGBA1C 5.5 04/06/2022   MICROALBUR 0.7 04/06/2022    MM 3D SCREEN BREAST BILATERAL  Result Date: 04/03/2022 CLINICAL DATA:  Screening. EXAM: DIGITAL SCREENING BILATERAL MAMMOGRAM WITH TOMOSYNTHESIS AND CAD TECHNIQUE: Bilateral screening digital craniocaudal and mediolateral oblique mammograms were obtained. Bilateral screening digital breast tomosynthesis was performed. The images were evaluated with computer-aided detection. COMPARISON:  Previous exam(s). ACR Breast Density Category b: There are scattered areas of fibroglandular density. FINDINGS: There are no findings suspicious for malignancy. IMPRESSION: No mammographic evidence of malignancy. A result letter of this screening mammogram will be mailed directly to the patient. RECOMMENDATION: Screening mammogram in one  year. (Code:SM-B-01Y) BI-RADS CATEGORY  1: Negative. Electronically Signed   By: Romona Curls M.D.   On: 04/03/2022 16:15    Assessment & Plan:   Problem List Items Addressed This Visit     White coat syndrome without diagnosis of hypertension - Primary    No medications needed at this time based on home readings and syncopal event during prior trial       Pernicious anemia   Relevant Orders   CBC with Differential/Platelet   Vitamin B12 (Completed)   Gastroesophageal reflux disease without esophagitis    Continue nexium  advised to stop use of celebrex due to evidence of gastric erosions on prior EGD       Relevant Medications   esomeprazole (NEXIUM) 40 MG capsule   Cervical spondylosis without myelopathy    S/p surgical decompression Dec 2016 .  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       Relevant Medications   butalbital-acetaminophen-caffeine (FIORICET) 50-325-40 MG tablet   traMADol (ULTRAM) 50 MG tablet   Other Relevant Orders   Ambulatory referral to Pain Clinic   Other Visit Diagnoses     Vitamin D deficiency       Relevant Orders   VITAMIN D 25 Hydroxy (Vit-D Deficiency, Fractures) (Completed)   Primary osteoarthritis of both hands       Relevant Medications   butalbital-acetaminophen-caffeine (FIORICET) 50-325-40 MG tablet   traMADol (ULTRAM) 50 MG tablet   Other Relevant Orders   Ambulatory referral to Hand Surgery   Need for influenza vaccination       Relevant Orders   Flu Vaccine QUAD 6+ mos PF IM (Fluarix Quad PF) (Completed)       I spent a total of  40 minutes with this patient in a face to face visit on the date of this encounter reviewing the last office visit with me in  May, her    most recent visit with Neurosurgery,  patient's diet and exercise habits, home blood pressure  readings, recent  labs and imaging studies ,   and post visit ordering of testing and therapeutics.    Follow-up: No follow-ups on  file.   Sherlene Shams, MD

## 2022-10-09 NOTE — Assessment & Plan Note (Signed)
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  Referral to Dr Hoy Morn.

## 2022-10-09 NOTE — Patient Instructions (Signed)
Do NOT use celebrex.  Your last EGD showed you had "erosions" in your stomach so you will develop an ulcer    Tramadol  butalbital and alprazolam have been refilled.  No BP meds unless home readings are > 130/80

## 2022-10-10 ENCOUNTER — Other Ambulatory Visit (INDEPENDENT_AMBULATORY_CARE_PROVIDER_SITE_OTHER): Payer: BC Managed Care – PPO

## 2022-10-10 DIAGNOSIS — I10 Essential (primary) hypertension: Secondary | ICD-10-CM

## 2022-10-10 LAB — LIPID PANEL
Cholesterol: 224 mg/dL — ABNORMAL HIGH (ref 0–200)
HDL: 86.9 mg/dL (ref >39.00)
LDL Cholesterol: 123 mg/dL — ABNORMAL HIGH (ref 0–99)
NonHDL: 136.94
Total CHOL/HDL Ratio: 3
Triglycerides: 71 mg/dL (ref 0.0–149.0)
VLDL: 14.2 mg/dL (ref 0.0–40.0)

## 2022-10-10 LAB — COMPREHENSIVE METABOLIC PANEL
ALT: 12 U/L (ref 0–35)
AST: 16 U/L (ref 0–37)
Albumin: 4.6 g/dL (ref 3.5–5.2)
Alkaline Phosphatase: 59 U/L (ref 39–117)
BUN: 13 mg/dL (ref 6–23)
CO2: 30 mEq/L (ref 19–32)
Calcium: 9.8 mg/dL (ref 8.4–10.5)
Chloride: 101 mEq/L (ref 96–112)
Creatinine, Ser: 0.7 mg/dL (ref 0.40–1.20)
GFR: 94.41 mL/min (ref >60.00)
Glucose, Bld: 90 mg/dL (ref 70–99)
Potassium: 4.6 mEq/L (ref 3.5–5.1)
Sodium: 139 mEq/L (ref 135–145)
Total Bilirubin: 0.2 mg/dL (ref 0.2–1.2)
Total Protein: 6.8 g/dL (ref 6.0–8.3)

## 2022-10-10 LAB — LDL CHOLESTEROL, DIRECT: Direct LDL: 119 mg/dL

## 2022-10-11 ENCOUNTER — Encounter: Payer: Self-pay | Admitting: Internal Medicine

## 2022-10-23 ENCOUNTER — Telehealth: Payer: Self-pay

## 2022-10-23 NOTE — Telephone Encounter (Signed)
Received call from [facility] stating that they received referral from PCP for pt to see Dr.Albert Bartko for her cervical spondylosis. Pam states that records do not show anything pertinent about pt's neck for example her receiving any injections for her neck or any imaging indicating reason for referral,etc... Pam can be reached [phone] direct [phone]

## 2022-11-21 NOTE — Telephone Encounter (Signed)
Additional information has been faxed.

## 2023-02-28 ENCOUNTER — Encounter: Payer: Self-pay | Admitting: Internal Medicine

## 2023-02-28 NOTE — Telephone Encounter (Signed)
I sent the patient a reply to call the office to schedule a visit with another provider on tomorrow for her issue.  Akiva Josey,cma

## 2023-03-01 ENCOUNTER — Ambulatory Visit: Payer: BC Managed Care – PPO | Admitting: Nurse Practitioner

## 2023-03-01 ENCOUNTER — Encounter: Payer: Self-pay | Admitting: Nurse Practitioner

## 2023-03-01 VITALS — BP 128/86 | HR 100 | Temp 98.2°F | Ht 62.0 in | Wt 143.5 lb

## 2023-03-01 DIAGNOSIS — B029 Zoster without complications: Secondary | ICD-10-CM

## 2023-03-01 MED ORDER — PREDNISONE 10 MG PO TABS: ORAL_TABLET | ORAL | 0 refills | Status: DC

## 2023-03-01 NOTE — Progress Notes (Signed)
Tomasita Morrow, NP-C Phone: [phone]  Destiny Martin is a 62 y.o. female who presents today for rash.  Patient reports returning home 4 days ago from a cruise. She was seen by a doctor on the cruise ship, 1 week ago from today, for a rash and diagnosed with shingles. She was given Benadryl and Cortisone cream to use. Reports the rash was not painful at that time but has been becoming more painful and tender since. She reports having pruritic, red, blisters along her left hip that has spread around her torso to her back. Denies fever.   Social History   Tobacco Use  Smoking Status Never  Smokeless Tobacco Never    Current Outpatient Medications on File Prior to Visit  Medication Sig Dispense Refill   ALPRAZolam (XANAX) 0.5 MG tablet Take 1 tablet (0.5 mg total) by mouth at bedtime as needed. 20 tablet 5   amphetamine-dextroamphetamine (ADDERALL XR) 30 MG 24 hr capsule Take 30 mg by mouth.  0   amphetamine-dextroamphetamine (ADDERALL) 30 MG tablet Take 1 tablet by mouth 2 (two) times daily.  0   butalbital-acetaminophen-caffeine (FIORICET) 50-325-40 MG tablet Take 1 tablet by mouth every 6 (six) hours as needed for headache. 60 tablet 5   conjugated estrogens (PREMARIN) vaginal cream Place 1 Applicatorful vaginally daily. 42.5 g 12   cyanocobalamin (,VITAMIN B-12,) 1000 MCG/ML injection INJECT 1 ML (1,000 MCG TOTAL) INTO THE MUSCLE EVERY 30 DAYS. 3 mL 3   esomeprazole (NEXIUM) 40 MG capsule Take 1 capsule (40 mg total) by mouth in the morning. . 90 capsule 1   FLUoxetine (PROZAC) 40 MG capsule Take 40 mg by mouth daily.     metFORMIN (GLUCOPHAGE-XR) 500 MG 24 hr tablet Take 1 tablet (500 mg total) by mouth daily with breakfast. (Patient not taking: Reported on 10/09/2022) 90 tablet 1   ondansetron (ZOFRAN-ODT) 4 MG disintegrating tablet TAKE 1 TABLET BY MOUTH EVERY 8 HOURS AS NEEDED FOR NAUSEA AND VOMITING 18 tablet 1   TANDEM 162-115.2 MG CAPS capsule TAKE 1 CAPSULE BY MOUTH DAILY WITH  BREAKFAST. 90 capsule 0   tiZANidine (ZANAFLEX) 4 MG capsule TAKE 1 CAPSULE BY MOUTH 3 TIMES DAILY AS NEEDED FOR MUSCLE SPASMS. 180 capsule 1   traMADol (ULTRAM) 50 MG tablet Take 1 tablet (50 mg total) by mouth every 8 (eight) hours as needed. 30 tablet 5   traZODone (DESYREL) 50 MG tablet TAKE 0.5-1 TABLETS (25-50 MG TOTAL) BY MOUTH AT BEDTIME AS NEEDED FOR SLEEP. (NEED APPT) 270 tablet 1   VAGIFEM 10 MCG TABS vaginal tablet INSERT ONE TABLET VAGINALLY NIGHTLY FOR 2 WEEKS, THEN WEEKLY THEREAFTER 24 tablet 3   No current facility-administered medications on file prior to visit.    ROS see history of present illness  Objective  Physical Exam Vitals:   03/01/23 0859  BP: 128/86  Pulse: 100  Temp: 98.2 F (36.8 C)  SpO2: 99%    BP Readings from Last 3 Encounters:  03/01/23 128/86  10/09/22 (!) 140/92  04/06/22 (!) 152/88   Wt Readings from Last 3 Encounters:  03/01/23 143 lb 8 oz (65.1 kg)  10/09/22 143 lb 8 oz (65.1 kg)  04/06/22 151 lb 6.4 oz (68.7 kg)    Physical Exam Constitutional:      General: She is not in acute distress.    Appearance: Normal appearance.  HENT:     Head: Normocephalic.  Cardiovascular:     Rate and Rhythm: Normal rate and regular rhythm.  Heart sounds: Normal heart sounds.  Pulmonary:     Effort: Pulmonary effort is normal.     Breath sounds: Normal breath sounds.  Skin:    General: Skin is warm and dry.     Findings: Rash present. Rash is vesicular.          Comments: Small, erythematous drying blisters noted on left hip wrapping around torso and up back. Mild tenderness.  Neurological:     General: No focal deficit present.     Mental Status: She is alert.  Psychiatric:        Mood and Affect: Mood normal.        Behavior: Behavior normal.    Assessment/Plan: Please see individual problem list.  Herpes zoster without complication Assessment & Plan: Patient with symptoms for over one week, outside of treatment window for  Acyclovir/Valtrex. Will treat with Prednisone taper. Advised Tylenol for pain, she also has Tramadol at home for pain. Blisters do appear to be drying out and healing well on exam. She will contact if symptoms are worsening or continuing to not improve.   Orders: -     predniSONE; TAKE 3 TABLETS PO QD FOR 3 DAYS THEN TAKE 2 TABLETS PO QD FOR 3 DAYS THEN TAKE 1 TABLET PO QD FOR 3 DAYS THEN TAKE 1/2 TAB PO QD FOR 3 DAYS  Dispense: 20 tablet; Refill: 0    Return if symptoms worsen or fail to improve.   Tomasita Morrow, NP-C [place]

## 2023-03-06 NOTE — Assessment & Plan Note (Addendum)
Patient with symptoms for over one week, outside of treatment window for Acyclovir/Valtrex. Will treat with Prednisone taper. Advised Tylenol for pain, she also has Tramadol at home for pain. Blisters do appear to be drying out and healing well on exam. She will contact if symptoms are worsening or continuing to not improve.

## 2023-05-01 ENCOUNTER — Other Ambulatory Visit: Payer: Self-pay | Admitting: Internal Medicine

## 2023-05-02 NOTE — Telephone Encounter (Signed)
Refilled: 10/09/2022 Last OV: 10/09/2022 Next OV: 10/11/2023

## 2023-06-05 ENCOUNTER — Other Ambulatory Visit: Payer: Self-pay | Admitting: Internal Medicine

## 2023-06-05 DIAGNOSIS — Z1231 Encounter for screening mammogram for malignant neoplasm of breast: Secondary | ICD-10-CM

## 2023-06-08 ENCOUNTER — Ambulatory Visit
Admission: RE | Admit: 2023-06-08 | Discharge: 2023-06-08 | Disposition: A | Discharge status: Home / Self Care | Payer: BC Managed Care – PPO | Source: Ambulatory Visit | Attending: Internal Medicine | Admitting: Internal Medicine

## 2023-06-08 DIAGNOSIS — Z1231 Encounter for screening mammogram for malignant neoplasm of breast: Secondary | ICD-10-CM | POA: Diagnosis not present

## 2023-06-09 ENCOUNTER — Other Ambulatory Visit: Payer: Self-pay | Admitting: Internal Medicine

## 2023-06-16 DIAGNOSIS — M19049 Primary osteoarthritis, unspecified hand: Secondary | ICD-10-CM | POA: Insufficient documentation

## 2023-06-16 HISTORY — DX: Primary osteoarthritis, unspecified hand: M19.049

## 2023-08-08 ENCOUNTER — Encounter: Payer: Self-pay | Admitting: Internal Medicine

## 2023-08-08 ENCOUNTER — Other Ambulatory Visit: Payer: Self-pay | Admitting: Internal Medicine

## 2023-08-08 DIAGNOSIS — E785 Hyperlipidemia, unspecified: Secondary | ICD-10-CM

## 2023-08-08 DIAGNOSIS — R7301 Impaired fasting glucose: Secondary | ICD-10-CM

## 2023-08-08 DIAGNOSIS — D510 Vitamin B12 deficiency anemia due to intrinsic factor deficiency: Secondary | ICD-10-CM

## 2023-08-08 DIAGNOSIS — E663 Overweight: Secondary | ICD-10-CM

## 2023-08-10 ENCOUNTER — Telehealth: Payer: Self-pay | Admitting: Internal Medicine

## 2023-08-10 NOTE — Telephone Encounter (Signed)
Patient states she is returning a call from Sandy Salaam, CMA.  Patient states she would like for [place] to please check her complete blood panel.  I scheduled a fasting lab appointment for patient on 08/16/2023.

## 2023-08-10 NOTE — Telephone Encounter (Signed)
I have pended labs for your approval.

## 2023-08-10 NOTE — Telephone Encounter (Signed)
LMTCB

## 2023-08-10 NOTE — Telephone Encounter (Signed)
Patient need lab orders.

## 2023-08-13 ENCOUNTER — Other Ambulatory Visit: Payer: Self-pay | Admitting: Internal Medicine

## 2023-08-16 ENCOUNTER — Other Ambulatory Visit (INDEPENDENT_AMBULATORY_CARE_PROVIDER_SITE_OTHER): Payer: BC Managed Care – PPO

## 2023-08-16 DIAGNOSIS — D510 Vitamin B12 deficiency anemia due to intrinsic factor deficiency: Secondary | ICD-10-CM | POA: Diagnosis not present

## 2023-08-16 DIAGNOSIS — E785 Hyperlipidemia, unspecified: Secondary | ICD-10-CM

## 2023-08-16 DIAGNOSIS — E663 Overweight: Secondary | ICD-10-CM

## 2023-08-16 DIAGNOSIS — R7301 Impaired fasting glucose: Secondary | ICD-10-CM | POA: Diagnosis not present

## 2023-08-16 DIAGNOSIS — D649 Anemia, unspecified: Secondary | ICD-10-CM

## 2023-08-16 LAB — COMPREHENSIVE METABOLIC PANEL
ALT: 8 U/L (ref 0–35)
AST: 16 U/L (ref 0–37)
Albumin: 4.1 g/dL (ref 3.5–5.2)
Alkaline Phosphatase: 53 U/L (ref 39–117)
BUN: 16 mg/dL (ref 6–23)
CO2: 27 meq/L (ref 19–32)
Calcium: 9.2 mg/dL (ref 8.4–10.5)
Chloride: 103 meq/L (ref 96–112)
Creatinine, Ser: 0.68 mg/dL (ref 0.40–1.20)
GFR: 94.5 mL/min (ref >60.00)
Glucose, Bld: 91 mg/dL (ref 70–99)
Potassium: 4.3 meq/L (ref 3.5–5.1)
Sodium: 140 meq/L (ref 135–145)
Total Bilirubin: 0.3 mg/dL (ref 0.2–1.2)
Total Protein: 6.5 g/dL (ref 6.0–8.3)

## 2023-08-16 LAB — CBC WITH DIFFERENTIAL/PLATELET
Basophils Absolute: 0.1 10*3/uL (ref 0.0–0.1)
Basophils Relative: 1.1 % (ref 0.0–3.0)
Eosinophils Absolute: 0.4 10*3/uL (ref 0.0–0.7)
Eosinophils Relative: 7.8 % — ABNORMAL HIGH (ref 0.0–5.0)
HCT: 30.6 % — ABNORMAL LOW (ref 36.0–46.0)
Hemoglobin: 9.7 g/dL — ABNORMAL LOW (ref 12.0–15.0)
Lymphocytes Relative: 34.2 % (ref 12.0–46.0)
Lymphs Abs: 2 10*3/uL (ref 0.7–4.0)
MCHC: 31.6 g/dL (ref 30.0–36.0)
MCV: 79.9 fl (ref 78.0–100.0)
Monocytes Absolute: 0.6 10*3/uL (ref 0.1–1.0)
Monocytes Relative: 9.9 % (ref 3.0–12.0)
Neutro Abs: 2.7 10*3/uL (ref 1.4–7.7)
Neutrophils Relative %: 47 % (ref 43.0–77.0)
Platelets: 477 10*3/uL — ABNORMAL HIGH (ref 150.0–400.0)
RBC: 3.82 Mil/uL — ABNORMAL LOW (ref 3.87–5.11)
RDW: 17.3 % — ABNORMAL HIGH (ref 11.5–15.5)
WBC: 5.7 10*3/uL (ref 4.0–10.5)

## 2023-08-16 LAB — LIPID PANEL
Cholesterol: 180 mg/dL (ref 0–200)
HDL: 71.6 mg/dL (ref >39.00)
LDL Cholesterol: 91 mg/dL (ref 0–99)
NonHDL: 107.97
Total CHOL/HDL Ratio: 3
Triglycerides: 83 mg/dL (ref 0.0–149.0)
VLDL: 16.6 mg/dL (ref 0.0–40.0)

## 2023-08-16 LAB — TSH: TSH: 1.64 u[IU]/mL (ref 0.35–5.50)

## 2023-08-16 LAB — HEMOGLOBIN A1C: Hgb A1c MFr Bld: 6.3 % (ref 4.6–6.5)

## 2023-08-16 LAB — VITAMIN B12: Vitamin B-12: 710 pg/mL (ref 211–911)

## 2023-08-16 LAB — LDL CHOLESTEROL, DIRECT: Direct LDL: 90 mg/dL

## 2023-08-19 ENCOUNTER — Encounter: Payer: Self-pay | Admitting: Internal Medicine

## 2023-08-19 NOTE — Addendum Note (Signed)
Addended by: Sherlene Shams on: 08/19/2023 05:00 PM   Modules accepted: Orders

## 2023-08-20 ENCOUNTER — Encounter: Payer: Self-pay | Admitting: Internal Medicine

## 2023-08-21 ENCOUNTER — Telehealth: Payer: Self-pay

## 2023-08-21 MED ORDER — IRON (FERROUS SULFATE) 325 (65 FE) MG PO TABS: 325.0000 mg | ORAL_TABLET | Freq: Every day | ORAL | 1 refills | Status: AC

## 2023-08-21 NOTE — Telephone Encounter (Signed)
I would like you  to have your iron levels CHECKED BEFORE you start taking iron

## 2023-08-21 NOTE — Telephone Encounter (Signed)
Patient states she needs to know how much iron she should be taking daily.  Patient states she is getting ready to go to work, so if we call, we can leave a message or send her a message via MyChart.  Patient states she is scheduled to have her iron rechecked on 10/01/2023 and would like to go ahead and start taking a supplement.

## 2023-08-22 ENCOUNTER — Other Ambulatory Visit (INDEPENDENT_AMBULATORY_CARE_PROVIDER_SITE_OTHER): Payer: BC Managed Care – PPO

## 2023-08-22 DIAGNOSIS — D649 Anemia, unspecified: Secondary | ICD-10-CM

## 2023-08-22 MED ORDER — AMLODIPINE BESYLATE 2.5 MG PO TABS: 2.5000 mg | ORAL_TABLET | Freq: Every day | ORAL | 1 refills | Status: DC

## 2023-08-22 NOTE — Telephone Encounter (Signed)
Patient called and labs scheduled

## 2023-08-22 NOTE — Telephone Encounter (Signed)
Pt is scheduled for this afternoon.  ?

## 2023-08-23 ENCOUNTER — Encounter: Payer: Self-pay | Admitting: Internal Medicine

## 2023-08-23 LAB — IRON,TIBC AND FERRITIN PANEL
%SAT: 5 % (calc) — ABNORMAL LOW (ref 16–45)
Ferritin: 6 ng/mL — ABNORMAL LOW (ref 16–232)
Iron: 26 ug/dL — ABNORMAL LOW (ref 45–160)
TIBC: 488 mcg/dL (calc) — ABNORMAL HIGH (ref 250–450)

## 2023-08-23 NOTE — Telephone Encounter (Signed)
Spoke with pt and she is going to come back in tomorrow at 11 am for the other labs that are future. Pt also wanted to see if you thought iron infusions would be okay for her. She stated that she has had to do them in the past.

## 2023-08-23 NOTE — Telephone Encounter (Signed)
Pt would like to be called regarding her blood work

## 2023-08-24 ENCOUNTER — Other Ambulatory Visit (INDEPENDENT_AMBULATORY_CARE_PROVIDER_SITE_OTHER): Payer: BC Managed Care – PPO

## 2023-08-24 ENCOUNTER — Other Ambulatory Visit: Payer: Self-pay | Admitting: Internal Medicine

## 2023-08-24 ENCOUNTER — Telehealth: Payer: Self-pay | Admitting: *Deleted

## 2023-08-24 DIAGNOSIS — D649 Anemia, unspecified: Secondary | ICD-10-CM | POA: Diagnosis not present

## 2023-08-24 DIAGNOSIS — D508 Other iron deficiency anemias: Secondary | ICD-10-CM

## 2023-08-24 DIAGNOSIS — D509 Iron deficiency anemia, unspecified: Secondary | ICD-10-CM

## 2023-08-24 NOTE — Telephone Encounter (Signed)
Pt received a call that from [facility] but would prefer to be seen in [place].  She didn't want to go to [facility] because she would have to establish care with a provider before she could get the infusion.  I explained to her that she would have to do the seem in [place] also. But they may schedule both (for her to see provider & get infusion after visit).  Pt aware and would like to try to get an appt in [place] first.

## 2023-08-24 NOTE — Telephone Encounter (Signed)
LMTCB

## 2023-08-24 NOTE — Assessment & Plan Note (Signed)
Etiology unclear at present. But likely related to UGI issues..  She has  No history of gastric bypass,  no PMB.   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      FOBT has been ordered. She was   treated IN THE PAST WITH IRON infusion secondary to intolerance of oral iron.  Patient requesting referral for iron infusions

## 2023-08-27 NOTE — Telephone Encounter (Signed)
Pt called stating she has an appointment at the cancer center in [place]

## 2023-08-27 NOTE — Telephone Encounter (Signed)
noted 

## 2023-08-27 NOTE — Telephone Encounter (Signed)
Pt is aware and appt has been scheduled.

## 2023-08-28 NOTE — Telephone Encounter (Signed)
Pt is scheduled to see hematology for iron infusions this week

## 2023-08-29 ENCOUNTER — Encounter: Payer: Self-pay | Admitting: Internal Medicine

## 2023-08-29 ENCOUNTER — Other Ambulatory Visit: Payer: Self-pay | Admitting: *Deleted

## 2023-08-29 ENCOUNTER — Encounter: Payer: Self-pay | Admitting: Oncology

## 2023-08-29 ENCOUNTER — Inpatient Hospital Stay: Payer: BC Managed Care – PPO | Attending: Oncology | Admitting: Oncology

## 2023-08-29 ENCOUNTER — Inpatient Hospital Stay: Payer: BC Managed Care – PPO

## 2023-08-29 VITALS — BP 141/100 | HR 95 | Temp 98.5°F | Resp 16 | Ht 62.0 in | Wt 145.0 lb

## 2023-08-29 DIAGNOSIS — D75839 Thrombocytosis, unspecified: Secondary | ICD-10-CM | POA: Diagnosis not present

## 2023-08-29 DIAGNOSIS — D509 Iron deficiency anemia, unspecified: Secondary | ICD-10-CM

## 2023-08-29 LAB — FOLATE RBC: RBC Folate: 738 ng/mL RBC (ref >280)

## 2023-08-29 LAB — INTRINSIC FACTOR ANTIBODIES: Intrinsic Factor: NEGATIVE

## 2023-08-29 NOTE — Progress Notes (Signed)
[facility]  [phone] [phone]  ID: Destiny Martin OB: 06-Apr-1961  MR#: [id number]  WNU#:[id number]  Patient Care Team: Sherlene Shams, MD as PCP - General (Internal Medicine) Sherlene Shams, MD (Internal Medicine)  CHIEF COMPLAINT: Iron deficiency anemia.  INTERVAL HISTORY: Patient is a 62 year old female who was noted to have a significantly reduced hemoglobin and iron stores on routine blood work.  She has significant increased fatigue, but otherwise feels well.  She has no neurologic complaints.  She denies any recent fevers or illnesses.  She has good appetite and denies weight loss.  She has no chest pain, shortness of breath, cough, or hemoptysis.  She has no nausea, vomiting, constipation, or diarrhea.  She has no melena or hematochezia.  She has no urinary complaints.  Patient offers no further specific complaints today.  REVIEW OF SYSTEMS:   Review of Systems  Constitutional:  Positive for malaise/fatigue. Negative for fever and weight loss.  Respiratory: Negative.  Negative for cough, hemoptysis and shortness of breath.   Cardiovascular:  Negative for chest pain and leg swelling.  Gastrointestinal: Negative.  Negative for abdominal pain, blood in stool and melena.  Genitourinary: Negative.  Negative for hematuria.  Musculoskeletal: Negative.  Negative for back pain.  Skin: Negative.   Neurological: Negative.  Negative for dizziness, focal weakness, weakness and headaches.  Psychiatric/Behavioral: Negative.  The patient is not nervous/anxious.     As per HPI. Otherwise, a complete review of systems is negative.  PAST MEDICAL HISTORY: Past Medical History:  Diagnosis Date   ADD (attention deficit disorder)    Allergy    Anemia 2014   GERD (gastroesophageal reflux disease)    Hypertension     PAST SURGICAL HISTORY: Past Surgical History:  Procedure Laterality Date   COLONOSCOPY WITH PROPOFOL N/A 03/27/2017   Procedure:  COLONOSCOPY WITH PROPOFOL;  Surgeon: Earline Mayotte, MD;  Location: [facility] ENDOSCOPY;  Service: Endoscopy;  Laterality: N/A;   ESOPHAGOGASTRODUODENOSCOPY (EGD) WITH PROPOFOL N/A 03/27/2017   Procedure: ESOPHAGOGASTRODUODENOSCOPY (EGD) WITH PROPOFOL;  Surgeon: Earline Mayotte, MD;  Location: [facility] ENDOSCOPY;  Service: Endoscopy;  Laterality: N/A;   ESOPHAGOGASTRODUODENOSCOPY (EGD) WITH PROPOFOL N/A 03/17/2020   Procedure: ESOPHAGOGASTRODUODENOSCOPY (EGD) WITH PROPOFOL;  Surgeon: Earline Mayotte, MD;  Location: [facility] ENDOSCOPY;  Service: Endoscopy;  Laterality: N/A;  also for excision anal tag in endo   ESOPHAGOGASTRODUODENOSCOPY ENDOSCOPY     2008    FOOT SURGERY Right 2007   JOINT REPLACEMENT  11-18-16   surgery on vertibra   NECK SURGERY  2016   TONSILLECTOMY  1974    FAMILY HISTORY: Family History  Problem Relation Age of Onset   Hypertension Father    Heart disease Father    Prostate cancer Father    Cancer Father    Hearing loss Father    Stroke Father    Heart disease Mother    Hypertension Mother    Arthritis Paternal Grandmother    Breast cancer Neg Hx     ADVANCED DIRECTIVES (Y/N):  N  HEALTH MAINTENANCE: Social History   Tobacco Use   Smoking status: Never   Smokeless tobacco: Never  Substance Use Topics   Alcohol use: Yes    Comment: I may have a drink but not often.   Drug use: No     Colonoscopy:  PAP:  Bone density:  Lipid panel:  Allergies  Allergen Reactions   Erythromycin Nausea Only   Lisinopril Swelling    Angio-edema  Penicillins    Sulfa Antibiotics Rash    Current Outpatient Medications  Medication Sig Dispense Refill   amphetamine-dextroamphetamine (ADDERALL XR) 30 MG 24 hr capsule Take 30 mg by mouth.  0   amphetamine-dextroamphetamine (ADDERALL) 30 MG tablet Take 1 tablet by mouth 2 (two) times daily.  0   butalbital-acetaminophen-caffeine (FIORICET) 50-325-40 MG tablet Take 1 tablet by mouth every 6 (six) hours as needed for  headache. 60 tablet 5   Calcium-Vitamin D-Vitamin K (VIACTIV CALCIUM PLUS D) 650-12.5-40 MG-MCG-MCG CHEW Chew by mouth.     Cholecalciferol (D3 2000 PO) Take 2,000 Units by mouth.     cyanocobalamin (VITAMIN B12) 1000 MCG/ML injection INJECT 1 ML (1,000 MCG) INTRAMUSCULARLY EVERY 30 DAYS 3 mL 3   esomeprazole (NEXIUM) 40 MG capsule TAKE 1 CAPSULE (40 MG TOTAL) BY MOUTH IN THE MORNING 90 capsule 3   Iron, Ferrous Sulfate, 325 (65 Fe) MG TABS Take 325 mg by mouth daily. 90 tablet 1   Multiple Vitamin (MULTIVITAMIN ADULT PO) Take by mouth.     TURMERIC PO Take 1,000 mg by mouth.     ALPRAZolam (XANAX) 0.5 MG tablet TAKE 1 TABLET (0.5 MG TOTAL) BY MOUTH AT BEDTIME AS NEEDED. (Patient not taking: Reported on 08/29/2023) 20 tablet 0   amLODipine (NORVASC) 2.5 MG tablet Take 1 tablet (2.5 mg total) by mouth daily. (Patient not taking: Reported on 08/29/2023) 90 tablet 1   conjugated estrogens (PREMARIN) vaginal cream Place 1 Applicatorful vaginally daily. (Patient not taking: Reported on 08/29/2023) 42.5 g 12   FLUoxetine (PROZAC) 40 MG capsule Take 40 mg by mouth daily. (Patient not taking: Reported on 08/29/2023)     metFORMIN (GLUCOPHAGE-XR) 500 MG 24 hr tablet Take 1 tablet (500 mg total) by mouth daily with breakfast. (Patient not taking: Reported on 10/09/2022) 90 tablet 1   ondansetron (ZOFRAN-ODT) 4 MG disintegrating tablet TAKE 1 TABLET BY MOUTH EVERY 8 HOURS AS NEEDED FOR NAUSEA AND VOMITING (Patient not taking: Reported on 08/29/2023) 18 tablet 1   predniSONE (DELTASONE) 10 MG tablet TAKE 3 TABLETS PO QD FOR 3 DAYS THEN TAKE 2 TABLETS PO QD FOR 3 DAYS THEN TAKE 1 TABLET PO QD FOR 3 DAYS THEN TAKE 1/2 TAB PO QD FOR 3 DAYS (Patient not taking: Reported on 08/29/2023) 20 tablet 0   TANDEM 162-115.2 MG CAPS capsule TAKE 1 CAPSULE BY MOUTH DAILY WITH BREAKFAST. (Patient not taking: Reported on 08/29/2023) 90 capsule 0   tiZANidine (ZANAFLEX) 4 MG capsule TAKE 1 CAPSULE BY MOUTH 3 TIMES DAILY AS NEEDED FOR  MUSCLE SPASMS. (Patient not taking: Reported on 08/29/2023) 180 capsule 1   traMADol (ULTRAM) 50 MG tablet TAKE 1 TABLET BY MOUTH EVERY 8 HOURS AS NEEDED. (Patient not taking: Reported on 08/29/2023) 30 tablet 0   traZODone (DESYREL) 50 MG tablet TAKE 0.5-1 TABLETS (25-50 MG TOTAL) BY MOUTH AT BEDTIME AS NEEDED FOR SLEEP. (NEED APPT) (Patient not taking: Reported on 08/29/2023) 270 tablet 1   VAGIFEM 10 MCG TABS vaginal tablet INSERT ONE TABLET VAGINALLY NIGHTLY FOR 2 WEEKS, THEN WEEKLY THEREAFTER (Patient not taking: Reported on 08/29/2023) 24 tablet 3   No current facility-administered medications for this visit.    OBJECTIVE: Vitals:   08/29/23 1114  BP: (!) 141/100  Pulse: 95  Resp: 16  Temp: 98.5 F (36.9 C)  SpO2: 100%     Body mass index is 26.52 kg/m.    ECOG FS:0 - Asymptomatic  General: Well-developed, well-nourished, no acute distress. Eyes: Pink conjunctiva,  anicteric sclera. HEENT: Normocephalic, moist mucous membranes. Lungs: No audible wheezing or coughing. Heart: Regular rate and rhythm. Abdomen: Soft, nontender, no obvious distention. Musculoskeletal: No edema, cyanosis, or clubbing. Neuro: Alert, answering all questions appropriately. Cranial nerves grossly intact. Skin: No rashes or petechiae noted. Psych: Normal affect. Lymphatics: No cervical, calvicular, axillary or inguinal LAD.   LAB RESULTS:  Lab Results  Component Value Date   NA 140 08/16/2023   K 4.3 08/16/2023   CL 103 08/16/2023   CO2 27 08/16/2023   GLUCOSE 91 08/16/2023   BUN 16 08/16/2023   CREATININE 0.68 08/16/2023   CALCIUM 9.2 08/16/2023   PROT 6.5 08/16/2023   ALBUMIN 4.1 08/16/2023   AST 16 08/16/2023   ALT 8 08/16/2023   ALKPHOS 53 08/16/2023   BILITOT 0.3 08/16/2023    Lab Results  Component Value Date   WBC 5.7 08/16/2023   NEUTROABS 2.7 08/16/2023   HGB 9.7 (L) 08/16/2023   HCT 30.6 (L) 08/16/2023   MCV 79.9 08/16/2023   PLT 477.0 (H) 08/16/2023   Lab Results   Component Value Date   IRON 26 (L) 08/22/2023   TIBC 488 (H) 08/22/2023   IRONPCTSAT 5 (L) 08/22/2023   Lab Results  Component Value Date   FERRITIN 6 (L) 08/22/2023     STUDIES: No results found.  ASSESSMENT: Iron deficiency anemia.  PLAN:    Iron deficiency anemia: Patient noted to have a reduced hemoglobin and iron stores and is symptomatic.  Her last colonoscopy was in 2018.  A referral has been sent to GI for repeat luminal evaluation.  Patient will return to clinic 5 times over the next 2 to 3 weeks to receive 200 mg IV Venofer.  She would then return to clinic in 4 months with repeat laboratory work, further evaluation, and continuation of care` if needed. Thrombocytosis: Likely secondary to iron deficiency.  Monitor.  I spent a total of 45 minutes reviewing chart data, face-to-face evaluation with the patient, counseling and coordination of care as detailed above.   Patient expressed understanding and was in agreement with this plan. She also understands that She can call clinic at any time with any questions, concerns, or complaints.    Jeralyn Ruths, MD   08/29/2023 12:05 PM

## 2023-09-07 ENCOUNTER — Inpatient Hospital Stay: Payer: BC Managed Care – PPO | Attending: Oncology

## 2023-09-07 VITALS — BP 133/80 | HR 83 | Temp 98.0°F | Resp 20

## 2023-09-07 DIAGNOSIS — D509 Iron deficiency anemia, unspecified: Secondary | ICD-10-CM | POA: Diagnosis present

## 2023-09-07 DIAGNOSIS — D75839 Thrombocytosis, unspecified: Secondary | ICD-10-CM | POA: Insufficient documentation

## 2023-09-07 MED ORDER — SODIUM CHLORIDE 0.9 % IV SOLN
Freq: Once | INTRAVENOUS | Status: AC
  Filled 2023-09-07: qty 250

## 2023-09-07 MED ORDER — SODIUM CHLORIDE 0.9 % IV SOLN
200.0000 mg | Freq: Once | INTRAVENOUS | Status: AC
  Administered 2023-09-07: 200 mg via INTRAVENOUS
  Filled 2023-09-07: qty 200

## 2023-09-10 ENCOUNTER — Inpatient Hospital Stay: Payer: BC Managed Care – PPO

## 2023-09-10 VITALS — BP 140/88 | HR 83 | Temp 98.2°F | Resp 16

## 2023-09-10 DIAGNOSIS — D509 Iron deficiency anemia, unspecified: Secondary | ICD-10-CM

## 2023-09-10 MED ORDER — SODIUM CHLORIDE 0.9 % IV SOLN
Freq: Once | INTRAVENOUS | Status: AC
  Filled 2023-09-10: qty 250

## 2023-09-10 MED ORDER — SODIUM CHLORIDE 0.9 % IV SOLN
200.0000 mg | Freq: Once | INTRAVENOUS | Status: AC
  Administered 2023-09-10: 200 mg via INTRAVENOUS
  Filled 2023-09-10: qty 200

## 2023-09-10 NOTE — Progress Notes (Signed)
Patient declined staying for 30 minute post Venofer observation. Patient aware to go ED for signs and symptoms of allergic reaction.

## 2023-09-13 ENCOUNTER — Inpatient Hospital Stay: Payer: BC Managed Care – PPO

## 2023-09-13 VITALS — BP 160/94 | HR 85 | Temp 97.9°F | Resp 18

## 2023-09-13 DIAGNOSIS — D509 Iron deficiency anemia, unspecified: Secondary | ICD-10-CM

## 2023-09-13 MED ORDER — SODIUM CHLORIDE 0.9 % IV SOLN
Freq: Once | INTRAVENOUS | Status: AC
  Filled 2023-09-13: qty 250

## 2023-09-13 MED ORDER — SODIUM CHLORIDE 0.9 % IV SOLN
200.0000 mg | Freq: Once | INTRAVENOUS | Status: AC
  Administered 2023-09-13: 200 mg via INTRAVENOUS
  Filled 2023-09-13: qty 10

## 2023-09-13 NOTE — Patient Instructions (Signed)
Iron Sucrose Injection What is this medication? IRON SUCROSE (EYE ern SOO krose) treats low levels of iron (iron deficiency anemia) in people with kidney disease. Iron is a mineral that plays an important role in making red blood cells, which carry oxygen from your lungs to the rest of your body. This medicine may be used for other purposes; ask your health care provider or pharmacist if you have questions. COMMON BRAND NAME(S): Venofer What should I tell my care team before I take this medication? They need to know if you have any of these conditions: Anemia not caused by low iron levels Heart disease High levels of iron in the blood Kidney disease Liver disease An unusual or allergic reaction to iron, other medications, foods, dyes, or preservatives Pregnant or trying to get pregnant Breastfeeding How should I use this medication? This medication is for infusion into a vein. It is given in a hospital or clinic setting. Talk to your care team about the use of this medication in children. While this medication may be prescribed for children as young as 2 years for selected conditions, precautions do apply. Overdosage: If you think you have taken too much of this medicine contact a poison control center or emergency room at once. NOTE: This medicine is only for you. Do not share this medicine with others. What if I miss a dose? Keep appointments for follow-up doses. It is important not to miss your dose. Call your care team if you are unable to keep an appointment. What may interact with this medication? Do not take this medication with any of the following: Deferoxamine Dimercaprol Other iron products This medication may also interact with the following: Chloramphenicol Deferasirox This list may not describe all possible interactions. Give your health care provider a list of all the medicines, herbs, non-prescription drugs, or dietary supplements you use. Also tell them if you smoke,  drink alcohol, or use illegal drugs. Some items may interact with your medicine. What should I watch for while using this medication? Visit your care team regularly. Tell your care team if your symptoms do not start to get better or if they get worse. You may need blood work done while you are taking this medication. You may need to follow a special diet. Talk to your care team. Foods that contain iron include: whole grains/cereals, dried fruits, beans, or peas, leafy green vegetables, and organ meats (liver, kidney). What side effects may I notice from receiving this medication? Side effects that you should report to your care team as soon as possible: Allergic reactions--skin rash, itching, hives, swelling of the face, lips, tongue, or throat Low blood pressure--dizziness, feeling faint or lightheaded, blurry vision Shortness of breath Side effects that usually do not require medical attention (report to your care team if they continue or are bothersome): Flushing Headache Joint pain Muscle pain Nausea Pain, redness, or irritation at injection site This list may not describe all possible side effects. Call your doctor for medical advice about side effects. You may report side effects to FDA at 1-800-FDA-1088. Where should I keep my medication? This medication is given in a hospital or clinic. It will not be stored at home. NOTE: This sheet is a summary. It may not cover all possible information. If you have questions about this medicine, talk to your doctor, pharmacist, or health care provider.  2024 Elsevier/Gold Standard (2023-04-27 00:00:00)

## 2023-09-14 ENCOUNTER — Inpatient Hospital Stay: Payer: BC Managed Care – PPO

## 2023-09-17 ENCOUNTER — Inpatient Hospital Stay: Payer: BC Managed Care – PPO

## 2023-09-17 VITALS — BP 134/70 | HR 74 | Temp 96.9°F | Resp 18

## 2023-09-17 DIAGNOSIS — D509 Iron deficiency anemia, unspecified: Secondary | ICD-10-CM | POA: Diagnosis not present

## 2023-09-17 MED ORDER — SODIUM CHLORIDE 0.9 % IV SOLN
Freq: Once | INTRAVENOUS | Status: AC
  Filled 2023-09-17: qty 250

## 2023-09-17 MED ORDER — SODIUM CHLORIDE 0.9 % IV SOLN
200.0000 mg | Freq: Once | INTRAVENOUS | Status: AC
  Administered 2023-09-17: 200 mg via INTRAVENOUS
  Filled 2023-09-17: qty 200

## 2023-09-17 NOTE — Patient Instructions (Signed)
Iron Sucrose Injection What is this medication? IRON SUCROSE (EYE ern SOO krose) treats low levels of iron (iron deficiency anemia) in people with kidney disease. Iron is a mineral that plays an important role in making red blood cells, which carry oxygen from your lungs to the rest of your body. This medicine may be used for other purposes; ask your health care provider or pharmacist if you have questions. COMMON BRAND NAME(S): Venofer What should I tell my care team before I take this medication? They need to know if you have any of these conditions: Anemia not caused by low iron levels Heart disease High levels of iron in the blood Kidney disease Liver disease An unusual or allergic reaction to iron, other medications, foods, dyes, or preservatives Pregnant or trying to get pregnant Breastfeeding How should I use this medication? This medication is for infusion into a vein. It is given in a hospital or clinic setting. Talk to your care team about the use of this medication in children. While this medication may be prescribed for children as young as 2 years for selected conditions, precautions do apply. Overdosage: If you think you have taken too much of this medicine contact a poison control center or emergency room at once. NOTE: This medicine is only for you. Do not share this medicine with others. What if I miss a dose? Keep appointments for follow-up doses. It is important not to miss your dose. Call your care team if you are unable to keep an appointment. What may interact with this medication? Do not take this medication with any of the following: Deferoxamine Dimercaprol Other iron products This medication may also interact with the following: Chloramphenicol Deferasirox This list may not describe all possible interactions. Give your health care provider a list of all the medicines, herbs, non-prescription drugs, or dietary supplements you use. Also tell them if you smoke,  drink alcohol, or use illegal drugs. Some items may interact with your medicine. What should I watch for while using this medication? Visit your care team regularly. Tell your care team if your symptoms do not start to get better or if they get worse. You may need blood work done while you are taking this medication. You may need to follow a special diet. Talk to your care team. Foods that contain iron include: whole grains/cereals, dried fruits, beans, or peas, leafy green vegetables, and organ meats (liver, kidney). What side effects may I notice from receiving this medication? Side effects that you should report to your care team as soon as possible: Allergic reactions--skin rash, itching, hives, swelling of the face, lips, tongue, or throat Low blood pressure--dizziness, feeling faint or lightheaded, blurry vision Shortness of breath Side effects that usually do not require medical attention (report to your care team if they continue or are bothersome): Flushing Headache Joint pain Muscle pain Nausea Pain, redness, or irritation at injection site This list may not describe all possible side effects. Call your doctor for medical advice about side effects. You may report side effects to FDA at 1-800-FDA-1088. Where should I keep my medication? This medication is given in a hospital or clinic. It will not be stored at home. NOTE: This sheet is a summary. It may not cover all possible information. If you have questions about this medicine, talk to your doctor, pharmacist, or health care provider.  2024 Elsevier/Gold Standard (2023-04-27 00:00:00)

## 2023-09-21 ENCOUNTER — Inpatient Hospital Stay: Payer: BC Managed Care – PPO

## 2023-09-21 VITALS — BP 121/72 | HR 87 | Temp 97.6°F | Resp 18

## 2023-09-21 DIAGNOSIS — D509 Iron deficiency anemia, unspecified: Secondary | ICD-10-CM | POA: Diagnosis not present

## 2023-09-21 MED ORDER — SODIUM CHLORIDE 0.9 % IV SOLN
200.0000 mg | Freq: Once | INTRAVENOUS | Status: AC
  Administered 2023-09-21: 200 mg via INTRAVENOUS
  Filled 2023-09-21: qty 200

## 2023-09-21 MED ORDER — SODIUM CHLORIDE 0.9 % IV SOLN
Freq: Once | INTRAVENOUS | Status: AC
  Filled 2023-09-21: qty 250

## 2023-09-21 NOTE — Progress Notes (Signed)
Pt has been educated and understands. Pt declined to stay 30 mins after iron infusion. VSS.

## 2023-10-01 ENCOUNTER — Other Ambulatory Visit: Payer: Self-pay | Admitting: Internal Medicine

## 2023-10-01 ENCOUNTER — Other Ambulatory Visit (INDEPENDENT_AMBULATORY_CARE_PROVIDER_SITE_OTHER): Payer: BC Managed Care – PPO

## 2023-10-01 DIAGNOSIS — D510 Vitamin B12 deficiency anemia due to intrinsic factor deficiency: Secondary | ICD-10-CM | POA: Diagnosis not present

## 2023-10-01 DIAGNOSIS — D508 Other iron deficiency anemias: Secondary | ICD-10-CM

## 2023-10-01 DIAGNOSIS — G4733 Obstructive sleep apnea (adult) (pediatric): Secondary | ICD-10-CM

## 2023-10-01 LAB — CBC WITH DIFFERENTIAL/PLATELET
Basophils Absolute: 0 10*3/uL (ref 0.0–0.1)
Basophils Relative: 0.9 % (ref 0.0–3.0)
Eosinophils Absolute: 0.3 10*3/uL (ref 0.0–0.7)
Eosinophils Relative: 5.6 % — ABNORMAL HIGH (ref 0.0–5.0)
HCT: 37.5 % (ref 36.0–46.0)
Hemoglobin: 11.8 g/dL — ABNORMAL LOW (ref 12.0–15.0)
Lymphocytes Relative: 31.4 % (ref 12.0–46.0)
Lymphs Abs: 1.5 10*3/uL (ref 0.7–4.0)
MCHC: 31.5 g/dL (ref 30.0–36.0)
MCV: 87.5 fL (ref 78.0–100.0)
Monocytes Absolute: 0.4 10*3/uL (ref 0.1–1.0)
Monocytes Relative: 8.2 % (ref 3.0–12.0)
Neutro Abs: 2.5 10*3/uL (ref 1.4–7.7)
Neutrophils Relative %: 53.9 % (ref 43.0–77.0)
Platelets: 386 10*3/uL (ref 150.0–400.0)
RBC: 4.29 Mil/uL (ref 3.87–5.11)
RDW: 22.3 % — ABNORMAL HIGH (ref 11.5–15.5)
WBC: 4.7 10*3/uL (ref 4.0–10.5)

## 2023-10-01 LAB — VITAMIN B12: Vitamin B-12: 600 pg/mL (ref 211–911)

## 2023-10-02 LAB — IRON,TIBC AND FERRITIN PANEL
%SAT: 25 % (ref 16–45)
Ferritin: 162 ng/mL (ref 16–232)
Iron: 87 ug/dL (ref 45–160)
TIBC: 344 ug/dL (ref 250–450)

## 2023-10-03 ENCOUNTER — Encounter: Payer: Self-pay | Admitting: Internal Medicine

## 2023-10-04 ENCOUNTER — Other Ambulatory Visit: Payer: Self-pay

## 2023-10-08 ENCOUNTER — Other Ambulatory Visit (INDEPENDENT_AMBULATORY_CARE_PROVIDER_SITE_OTHER): Payer: BC Managed Care – PPO

## 2023-10-08 ENCOUNTER — Encounter: Payer: Self-pay | Admitting: Internal Medicine

## 2023-10-08 DIAGNOSIS — D649 Anemia, unspecified: Secondary | ICD-10-CM | POA: Diagnosis not present

## 2023-10-08 LAB — FECAL OCCULT BLOOD, IMMUNOCHEMICAL: Fecal Occult Bld: NEGATIVE

## 2023-10-08 NOTE — Telephone Encounter (Signed)
Appt is scheduled as a follow up. Does pt need to keep the appt?

## 2023-10-08 NOTE — Progress Notes (Unsigned)
Celso Amy, PA-C [address]  [address]  [place], [place] [address]  Main: [phone]  Fax: [phone]   Gastroenterology Consultation  Referring Provider:     Sherlene Shams, MD Primary Care Physician:  Sherlene Shams, MD Primary Gastroenterologist:  Celso Amy, PA-C / Dr. Wyline Mood   Reason for Consultation:     Iron deficiency anemia        HPI:   Destiny Martin is a 62 y.o. y/o female referred for consultation & management  by Sherlene Shams, MD. She is here to evaluate iron deficiency anemia.  He has received IV iron infusions in the past month through hematology.  Labs 08/16/2023: Hemoglobin 9.7, MCV 79, platelet 477, iron 26, iron saturation 5%, ferritin 6. Labs 10/01/2023: Hemoglobin 11.8, MCV 87, platelet 386, total iron 87, iron saturation 25, ferritin 162.  FOBT Negative.  EGD 03/2020 by Dr. Lemar Livings: Medium paraesophageal hernia.  Multiple 5 mm fundic gland gastric polyps with no bleeding.  No ulcers.  Mild chronic gastritis.  Biopsies negative for H. pylori.  EGD 03/2017 by Dr. Lemar Livings: Medium hiatal hernia, multiple benign fundic gland gastric polyps, normal duodenum.  Biopsies negative for H. pylori.  Colonoscopy 03/2017 by Dr. Lemar Livings: Normal.  No biopsies.  10-year repeat.  GI symptoms: She had moderate acid reflux for many years.  Currently taking Nexium 40 mg once daily which helps.  Occasional Tums as needed for breakthrough GERD.  She denies abdominal pain, dysphagia, weight loss, melena, or hematochezia.  Denies NSAID or aspirin use.  Occasionally takes Tylenol.  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   GERD (gastroesophageal reflux disease)    Hypertension    Osteoarthritis of carpometacarpal (CMC) joint of thumb 02/20/2019   Pain in right hand 02/20/2019    Past Surgical History:  Procedure Laterality Date   COLONOSCOPY WITH PROPOFOL N/A  03/27/2017   Procedure: COLONOSCOPY WITH PROPOFOL;  Surgeon: Earline Mayotte, MD;  Location: [facility] ENDOSCOPY;  Service: Endoscopy;  Laterality: N/A;   ESOPHAGOGASTRODUODENOSCOPY (EGD) WITH PROPOFOL N/A 03/27/2017   Procedure: ESOPHAGOGASTRODUODENOSCOPY (EGD) WITH PROPOFOL;  Surgeon: Earline Mayotte, MD;  Location: [facility] ENDOSCOPY;  Service: Endoscopy;  Laterality: N/A;   ESOPHAGOGASTRODUODENOSCOPY (EGD) WITH PROPOFOL N/A 03/17/2020   Procedure: ESOPHAGOGASTRODUODENOSCOPY (EGD) WITH PROPOFOL;  Surgeon: Earline Mayotte, MD;  Location: [facility] ENDOSCOPY;  Service: Endoscopy;  Laterality: N/A;  also for excision anal tag in endo   ESOPHAGOGASTRODUODENOSCOPY ENDOSCOPY     2008    FOOT SURGERY Right 2007   JOINT REPLACEMENT  11-18-16   surgery on vertibra   NECK SURGERY  2016   TONSILLECTOMY  1974    Prior to Admission medications   Medication Sig Start Date End Date Taking? Authorizing Provider  ALPRAZolam (XANAX) 0.5 MG tablet TAKE 1 TABLET (0.5 MG TOTAL) BY MOUTH AT BEDTIME AS NEEDED. Patient not taking: Reported on 08/29/2023 08/10/23   Worthy Rancher B, FNP  amLODipine (NORVASC) 2.5 MG tablet Take 1 tablet (2.5 mg total) by mouth daily. Patient not taking: Reported on 08/29/2023 08/22/23   Sherlene Shams, MD  amphetamine-dextroamphetamine (ADDERALL XR) 30 MG 24 hr capsule Take 30 mg by mouth. 05/05/18   [provider]  amphetamine-dextroamphetamine (ADDERALL) 30 MG tablet Take 1 tablet by mouth 2 (two) times daily. 05/05/18   [provider]  butalbital-acetaminophen-caffeine (FIORICET) 50-325-40 MG tablet  Take 1 tablet by mouth every 6 (six) hours as needed for headache. 10/09/22   Sherlene Shams, MD  Calcium-Vitamin D-Vitamin K (VIACTIV CALCIUM PLUS D) 650-12.5-40 MG-MCG-MCG CHEW Chew by mouth.    [provider]  celecoxib (CELEBREX) 200 MG capsule Take 200 mg by mouth 2 (two) times daily. 05/01/23   [provider]  Cholecalciferol (D3 2000 PO) Take 2,000  Units by mouth.    [provider]  conjugated estrogens (PREMARIN) vaginal cream Place 1 Applicatorful vaginally daily. Patient not taking: Reported on 08/29/2023 05/05/20   Sherlene Shams, MD  cyanocobalamin (VITAMIN B12) 1000 MCG/ML injection INJECT 1 ML (1,000 MCG) INTRAMUSCULARLY EVERY 30 DAYS 08/10/23   Sherlene Shams, MD  esomeprazole (NEXIUM) 40 MG capsule TAKE 1 CAPSULE (40 MG TOTAL) BY MOUTH IN THE MORNING 06/11/23   Sherlene Shams, MD  FLUoxetine (PROZAC) 40 MG capsule Take 40 mg by mouth daily. Patient not taking: Reported on 08/29/2023 06/20/19   [provider]  Iron, Ferrous Sulfate, 325 (65 Fe) MG TABS Take 325 mg by mouth daily. 08/21/23   Sherlene Shams, MD  metFORMIN (GLUCOPHAGE-XR) 500 MG 24 hr tablet Take 1 tablet (500 mg total) by mouth daily with breakfast. Patient not taking: Reported on 10/09/2022 06/23/22   Sherlene Shams, MD  Multiple Vitamin (MULTIVITAMIN ADULT PO) Take by mouth.    [provider]  ondansetron (ZOFRAN-ODT) 4 MG disintegrating tablet TAKE 1 TABLET BY MOUTH EVERY 8 HOURS AS NEEDED FOR NAUSEA AND VOMITING Patient not taking: Reported on 08/29/2023 10/10/21   Sherlene Shams, MD  predniSONE (DELTASONE) 10 MG tablet TAKE 3 TABLETS PO QD FOR 3 DAYS THEN TAKE 2 TABLETS PO QD FOR 3 DAYS THEN TAKE 1 TABLET PO QD FOR 3 DAYS THEN TAKE 1/2 TAB PO QD FOR 3 DAYS Patient not taking: Reported on 08/29/2023 03/01/23   Bethanie Dicker, NP  TANDEM 162-115.2 MG CAPS capsule TAKE 1 CAPSULE BY MOUTH DAILY WITH BREAKFAST. Patient not taking: Reported on 08/29/2023 06/07/21   Sherlene Shams, MD  tiZANidine (ZANAFLEX) 4 MG capsule TAKE 1 CAPSULE BY MOUTH 3 TIMES DAILY AS NEEDED FOR MUSCLE SPASMS. Patient not taking: Reported on 08/29/2023 04/06/22   Sherlene Shams, MD  traMADol (ULTRAM) 50 MG tablet TAKE 1 TABLET BY MOUTH EVERY 8 HOURS AS NEEDED. Patient not taking: Reported on 08/29/2023 05/02/23   Sherlene Shams, MD  traZODone (DESYREL) 50 MG tablet TAKE 0.5-1  TABLETS (25-50 MG TOTAL) BY MOUTH AT BEDTIME AS NEEDED FOR SLEEP. (NEED APPT) Patient not taking: Reported on 08/29/2023 01/28/21   Sherlene Shams, MD  TURMERIC PO Take 1,000 mg by mouth.    [provider]  VAGIFEM 10 MCG TABS vaginal tablet INSERT ONE TABLET VAGINALLY NIGHTLY FOR 2 WEEKS, THEN WEEKLY THEREAFTER Patient not taking: Reported on 08/29/2023 06/22/22   Sherlene Shams, MD    Family History  Problem Relation Age of Onset   Hypertension Father    Heart disease Father    Prostate cancer Father    Cancer Father    Hearing loss Father    Stroke Father    Heart disease Mother    Hypertension Mother    Arthritis Paternal Grandmother    Breast cancer Neg Hx      Social History   Tobacco Use   Smoking status: Never   Smokeless tobacco: Never  Substance Use Topics   Alcohol use: Yes    Comment: I  may have a drink but not often.   Drug use: No    Allergies as of 10/09/2023 - Review Complete 10/09/2023  Allergen Reaction Noted   Erythromycin Nausea Only 09/06/2011   Lisinopril Swelling 01/11/2015   Penicillins  09/06/2011   Sulfa antibiotics Rash 09/06/2011    Review of Systems:    All systems reviewed and negative except where noted in HPI.   Physical Exam:  BP 133/85   Pulse 97   Temp 98.1 F (36.7 C)   Ht 5\' 2"  (1.575 m)   Wt 143 lb 9.6 oz (65.1 kg)   LMP 01/15/2018 (Approximate)   BMI 26.26 kg/m  Patient's last menstrual period was 01/15/2018 (approximate).  General:   Alert,  Well-developed, well-nourished, pleasant and cooperative in NAD Lungs:  Respirations even and unlabored.  Clear throughout to auscultation.   No wheezes, crackles, or rhonchi. No acute distress. Heart:  Regular rate and rhythm; no murmurs, clicks, rubs, or gallops. Abdomen:  Normal bowel sounds.  No bruits.  Soft, and non-distended without masses, hepatosplenomegaly or hernias noted.  No Tenderness.  No guarding or rebound tenderness.    Neurologic:  Alert and oriented x3;   grossly normal neurologically. Psych:  Alert and cooperative. Normal mood and affect.  Imaging Studies: No results found.  Assessment and Plan:   Destiny Martin is a 62 y.o. y/o female has been referred for:  1.  Iron deficiency anemia -improved post IV iron infusion  Scheduling EGD & Colonoscopy I discussed risks of EGD and colonoscopy with patient to include risk of bleeding, perforation, and risk of sedation.  Patient expressed understanding and agrees to proceed with procedures.  Patient requested Sutab pill prep.  Pending EGD and colonoscopy results, then schedule capsule endoscopy.  2.  Medium paraesophageal hernia  Possible source for her anemia?    May need fundoplication hernia repair.  3.  GERD  Continue Nexium 40 mg 1 tablet daily.  Avoid GERD trigger foods / drinks such as coffee, sodas, peppermint, citrus fruits, and spicey foods.  Avoid eating 2-3 hours before bedtime.   Follow up as needed based on EGD and colonoscopy results.  Celso Amy, PA-C

## 2023-10-09 ENCOUNTER — Ambulatory Visit: Payer: BC Managed Care – PPO | Admitting: Physician Assistant

## 2023-10-09 ENCOUNTER — Encounter: Payer: Self-pay | Admitting: Physician Assistant

## 2023-10-09 VITALS — BP 133/85 | HR 97 | Temp 98.1°F | Ht 62.0 in | Wt 143.6 lb

## 2023-10-09 DIAGNOSIS — K219 Gastro-esophageal reflux disease without esophagitis: Secondary | ICD-10-CM

## 2023-10-09 DIAGNOSIS — K449 Diaphragmatic hernia without obstruction or gangrene: Secondary | ICD-10-CM

## 2023-10-09 DIAGNOSIS — D509 Iron deficiency anemia, unspecified: Secondary | ICD-10-CM | POA: Diagnosis not present

## 2023-10-09 MED ORDER — SUTAB 1479-225-188 MG PO TABS: ORAL_TABLET | ORAL | 0 refills | Status: AC

## 2023-10-10 ENCOUNTER — Encounter: Payer: Self-pay | Admitting: Oncology

## 2023-10-11 ENCOUNTER — Encounter: Payer: Self-pay | Admitting: Internal Medicine

## 2023-10-11 ENCOUNTER — Ambulatory Visit: Payer: BC Managed Care – PPO | Admitting: Internal Medicine

## 2023-10-11 VITALS — BP 140/76 | HR 115 | Ht 62.0 in | Wt 133.6 lb

## 2023-10-11 DIAGNOSIS — F5102 Adjustment insomnia: Secondary | ICD-10-CM

## 2023-10-11 DIAGNOSIS — D508 Other iron deficiency anemias: Secondary | ICD-10-CM | POA: Diagnosis not present

## 2023-10-11 DIAGNOSIS — D510 Vitamin B12 deficiency anemia due to intrinsic factor deficiency: Secondary | ICD-10-CM

## 2023-10-11 DIAGNOSIS — Z23 Encounter for immunization: Secondary | ICD-10-CM | POA: Diagnosis not present

## 2023-10-11 NOTE — Assessment & Plan Note (Signed)
Resoliving with iv iron  but recurrent;  etiology unclear. Histor yof paraesophageal hernia .  For EGD/colonoscopy next week

## 2023-10-11 NOTE — Assessment & Plan Note (Addendum)
She continues to supplement  parenterally    Lab Results  Component Value Date   WBC 4.7 10/01/2023   HGB 11.8 (L) 10/01/2023   HCT 37.5 10/01/2023   MCV 87.5 10/01/2023   PLT 386.0 10/01/2023

## 2023-10-11 NOTE — Progress Notes (Signed)
Subjective:  Patient ID: Destiny Martin, female    DOB: [birth year]  Age: 62 y.o. MRN: [id number]  CC: The primary encounter diagnosis was Need for influenza vaccination. Diagnoses of Other iron deficiency anemia, Insomnia due to stress, and Pernicious anemia were also pertinent to this visit.   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  EGD and colonoscopy planned.  Anemia has nearly resolved with iron infusions   2) HTN: only using amlodipine prn   3) overweight :  intentional weight loss of 12 lbs , wants to lose 10 more.   Outpatient Medications Prior to Visit  Medication Sig Dispense Refill   ALPRAZolam (XANAX) 0.5 MG tablet TAKE 1 TABLET (0.5 MG TOTAL) BY MOUTH AT BEDTIME AS NEEDED. 20 tablet 0   amLODipine (NORVASC) 2.5 MG tablet Take 1 tablet (2.5 mg total) by mouth daily. 90 tablet 1   amphetamine-dextroamphetamine (ADDERALL XR) 30 MG 24 hr capsule Take 30 mg by mouth.  0   amphetamine-dextroamphetamine (ADDERALL) 30 MG tablet Take 1 tablet by mouth 2 (two) times daily.  0   butalbital-acetaminophen-caffeine (FIORICET) 50-325-40 MG tablet Take 1 tablet by mouth every 6 (six) hours as needed for headache. 60 tablet 5   Calcium-Vitamin D-Vitamin K (VIACTIV CALCIUM PLUS D) 650-12.5-40 MG-MCG-MCG CHEW Chew by mouth.     celecoxib (CELEBREX) 200 MG capsule Take 200 mg by mouth 2 (two) times daily.     Cholecalciferol (D3 2000 PO) Take 2,000 Units by mouth.     conjugated estrogens (PREMARIN) vaginal cream Place 1 Applicatorful vaginally daily. 42.5 g 12   cyanocobalamin (VITAMIN B12) 1000 MCG/ML injection INJECT 1 ML (1,000 MCG) INTRAMUSCULARLY EVERY 30 DAYS 3 mL 3   esomeprazole (NEXIUM) 40 MG capsule TAKE 1 CAPSULE (40 MG TOTAL) BY MOUTH IN THE MORNING 90 capsule 3   FLUoxetine (PROZAC) 40 MG  capsule Take 40 mg by mouth daily.     Iron, Ferrous Sulfate, 325 (65 Fe) MG TABS Take 325 mg by mouth daily. 90 tablet 1   Multiple Vitamin (MULTIVITAMIN ADULT PO) Take by mouth.     ondansetron (ZOFRAN-ODT) 4 MG disintegrating tablet TAKE 1 TABLET BY MOUTH EVERY 8 HOURS AS NEEDED FOR NAUSEA AND VOMITING 18 tablet 1   Sodium Sulfate-Mag Sulfate-KCl (SUTAB) [phone] MG TABS Take 1 tablet by mouth as directed for 1 day, THEN 1 tablet as directed for 1 day. 24 tablet 0   tiZANidine (ZANAFLEX) 4 MG capsule TAKE 1 CAPSULE BY MOUTH 3 TIMES DAILY AS NEEDED FOR MUSCLE SPASMS. 180 capsule 1   traMADol (ULTRAM) 50 MG tablet TAKE 1 TABLET BY MOUTH EVERY 8 HOURS AS NEEDED. 30 tablet 0   traZODone (DESYREL) 50 MG tablet TAKE 0.5-1 TABLETS (25-50 MG TOTAL) BY MOUTH AT BEDTIME AS NEEDED FOR SLEEP. (NEED APPT) 270 tablet 1   TURMERIC PO Take 1,000 mg by mouth.     VAGIFEM 10 MCG TABS vaginal tablet INSERT ONE TABLET VAGINALLY NIGHTLY FOR 2 WEEKS, THEN WEEKLY THEREAFTER 24 tablet 3   metFORMIN (GLUCOPHAGE-XR) 500 MG 24 hr tablet Take 1 tablet (500 mg total) by mouth daily with breakfast. (Patient not taking: Reported on 10/11/2023) 90 tablet 1   predniSONE (DELTASONE) 10 MG tablet TAKE 3 TABLETS PO QD FOR 3 DAYS THEN TAKE 2 TABLETS  PO QD FOR 3 DAYS THEN TAKE 1 TABLET PO QD FOR 3 DAYS THEN TAKE 1/2 TAB PO QD FOR 3 DAYS (Patient not taking: Reported on 10/11/2023) 20 tablet 0   TANDEM 162-115.2 MG CAPS capsule TAKE 1 CAPSULE BY MOUTH DAILY WITH BREAKFAST. (Patient not taking: Reported on 10/11/2023) 90 capsule 0   No facility-administered medications prior to visit.    Review of Systems;  Patient denies headache, fevers, malaise, unintentional weight loss, skin rash, eye pain, sinus congestion and sinus pain, sore throat, dysphagia,  hemoptysis , cough, dyspnea, wheezing, chest pain, palpitations, orthopnea, edema, abdominal pain, nausea, melena, diarrhea, constipation, flank pain, dysuria, hematuria, urinary   Frequency, nocturia, numbness, tingling, seizures,  Focal weakness, Loss of consciousness,  Tremor, insomnia, depression, anxiety, and suicidal ideation.      Objective:  BP (!) 140/76   Pulse (!) 115   Ht 5\' 2"  (1.575 m)   Wt 133 lb 9.6 oz (60.6 kg)   LMP 01/15/2018 (Approximate)   SpO2 98%   BMI 24.44 kg/m   BP Readings from Last 3 Encounters:  10/11/23 (!) 140/76  10/09/23 133/85  09/21/23 121/72    Wt Readings from Last 3 Encounters:  10/11/23 133 lb 9.6 oz (60.6 kg)  10/09/23 143 lb 9.6 oz (65.1 kg)  08/29/23 145 lb (65.8 kg)    Physical Exam Vitals reviewed.  Constitutional:      General: She is not in acute distress.    Appearance: Normal appearance. She is normal weight. She is not ill-appearing, toxic-appearing or diaphoretic.  HENT:     Head: Normocephalic.  Eyes:     General: No scleral icterus.       Right eye: No discharge.        Left eye: No discharge.     Conjunctiva/sclera: Conjunctivae normal.  Cardiovascular:     Rate and Rhythm: Normal rate and regular rhythm.     Heart sounds: Normal heart sounds.  Pulmonary:     Effort: Pulmonary effort is normal. No respiratory distress.     Breath sounds: Normal breath sounds.  Musculoskeletal:        General: Normal range of motion.  Skin:    General: Skin is warm and dry.  Neurological:     General: No focal deficit present.     Mental Status: She is alert and oriented to person, place, and time. Mental status is at baseline.  Psychiatric:        Mood and Affect: Mood normal.        Behavior: Behavior normal.        Thought Content: Thought content normal.        Judgment: Judgment normal.    Lab Results  Component Value Date   HGBA1C 6.3 08/16/2023   HGBA1C 5.5 04/06/2022    Lab Results  Component Value Date   CREATININE 0.68 08/16/2023   CREATININE 0.70 10/10/2022   CREATININE 0.75 04/06/2022    Lab Results  Component Value Date   WBC 4.7 10/01/2023   HGB 11.8 (L) 10/01/2023   HCT  37.5 10/01/2023   PLT 386.0 10/01/2023   GLUCOSE 91 08/16/2023   CHOL 180 08/16/2023   TRIG 83.0 08/16/2023   HDL 71.60 08/16/2023   LDLDIRECT 90.0 08/16/2023   LDLCALC 91 08/16/2023   ALT 8 08/16/2023   AST 16 08/16/2023   NA 140 08/16/2023   K 4.3 08/16/2023   CL 103 08/16/2023   CREATININE 0.68 08/16/2023   BUN 16 08/16/2023  CO2 27 08/16/2023   TSH 1.64 08/16/2023   HGBA1C 6.3 08/16/2023   MICROALBUR 0.7 04/06/2022    MM 3D SCREENING MAMMOGRAM BILATERAL BREAST  Result Date: 06/11/2023 CLINICAL DATA:  Screening. EXAM: DIGITAL SCREENING BILATERAL MAMMOGRAM WITH TOMOSYNTHESIS AND CAD TECHNIQUE: Bilateral screening digital craniocaudal and mediolateral oblique mammograms were obtained. Bilateral screening digital breast tomosynthesis was performed. The images were evaluated with computer-aided detection. COMPARISON:  Previous exam(s). ACR Breast Density Category b: There are scattered areas of fibroglandular density. FINDINGS: There are no findings suspicious for malignancy. IMPRESSION: No mammographic evidence of malignancy. A result letter of this screening mammogram will be mailed directly to the patient. RECOMMENDATION: Screening mammogram in one year. (Code:SM-B-01Y) BI-RADS CATEGORY  1: Negative. Electronically Signed   By: Sande Brothers M.D.   On: 06/11/2023 13:30    Assessment & Plan:  .Need for influenza vaccination -     Flu vaccine trivalent PF, 6mos and older(Flulaval,Afluria,Fluarix,Fluzone)  Other iron deficiency anemia Assessment & Plan: Resoliving with iv iron  but recurrent;  etiology unclear. Histor yof paraesophageal hernia .  For EGD/colonoscopy next week    Insomnia due to stress Assessment & Plan: Managed iwht infrequent use of alprazolam.  The risks and benefits of benzodiazepine use were discussed with patient today including excessive sedation leading to respiratory depression,  impaired thinking/driving, and addiction.  Patient was advised to avoid  concurrent use with alcohol, to use medication only as needed and not to share with others  .    Pernicious anemia Assessment & Plan:  She continues to supplement  parenterally    Lab Results  Component Value Date   WBC 4.7 10/01/2023   HGB 11.8 (L) 10/01/2023   HCT 37.5 10/01/2023   MCV 87.5 10/01/2023   PLT 386.0 10/01/2023         I provided 30 minutes of face-to-face time during this encounter reviewing patient's last visit with me, patient's  most recent visit with cardiology,  nephrology,  and neurology,  recent surgical and non surgical procedures, previous  labs and imaging studies, counseling on currently addressed issues,  and post visit ordering to diagnostics and therapeutics .   Follow-up: No follow-ups on file.   Destiny Shams, MD

## 2023-10-11 NOTE — Assessment & Plan Note (Signed)
Managed iwht infrequent use of alprazolam.  The risks and benefits of benzodiazepine use were discussed with patient today including excessive sedation leading to respiratory depression,  impaired thinking/driving, and addiction.  Patient was advised to avoid concurrent use with alcohol, to use medication only as needed and not to share with others  .

## 2023-10-11 NOTE — Patient Instructions (Signed)
Goal BP is 120/70 to 130/80   Start amlodipine if you are continually higher than that at home   We will check your iron and CBC every 6 weeks , then quarterly if stable.    Return for the shingles vaccine (or you can get it at your pharmacy)

## 2023-10-22 ENCOUNTER — Other Ambulatory Visit: Payer: Self-pay | Admitting: Internal Medicine

## 2023-11-07 ENCOUNTER — Telehealth: Payer: Self-pay

## 2023-11-07 ENCOUNTER — Encounter: Payer: Self-pay | Admitting: Gastroenterology

## 2023-11-07 NOTE — Telephone Encounter (Signed)
Patient called and left a message on my voicemail stating that she has loss her paper work for her colonoscopy and is wondering if it could be email to her. Called patient back and asked her if she had access to her mychart account and she said yes and she said she look and she said she could not find where it said what to do a week before. Informed her to go under communications and then letters and it would be dated 10/09/2023 and she could pull up that letter. I also sent a mychart message with all the information in it also

## 2023-11-14 ENCOUNTER — Ambulatory Visit
Admission: RE | Admit: 2023-11-14 | Discharge: 2023-11-14 | Disposition: A | Discharge status: Home / Self Care | Payer: BC Managed Care – PPO | Attending: Gastroenterology | Admitting: Gastroenterology

## 2023-11-14 ENCOUNTER — Encounter: Payer: Self-pay | Admitting: Gastroenterology

## 2023-11-14 ENCOUNTER — Ambulatory Visit: Payer: BC Managed Care – PPO | Admitting: Certified Registered"

## 2023-11-14 ENCOUNTER — Encounter
Admission: RE | Disposition: A | Discharge status: Home / Self Care | Payer: Self-pay | Source: Home / Self Care | Attending: Gastroenterology

## 2023-11-14 DIAGNOSIS — D649 Anemia, unspecified: Secondary | ICD-10-CM | POA: Insufficient documentation

## 2023-11-14 DIAGNOSIS — Z79899 Other long term (current) drug therapy: Secondary | ICD-10-CM | POA: Diagnosis not present

## 2023-11-14 DIAGNOSIS — G4730 Sleep apnea, unspecified: Secondary | ICD-10-CM | POA: Insufficient documentation

## 2023-11-14 DIAGNOSIS — F419 Anxiety disorder, unspecified: Secondary | ICD-10-CM | POA: Insufficient documentation

## 2023-11-14 DIAGNOSIS — D509 Iron deficiency anemia, unspecified: Secondary | ICD-10-CM | POA: Diagnosis present

## 2023-11-14 DIAGNOSIS — K219 Gastro-esophageal reflux disease without esophagitis: Secondary | ICD-10-CM | POA: Insufficient documentation

## 2023-11-14 DIAGNOSIS — I10 Essential (primary) hypertension: Secondary | ICD-10-CM | POA: Diagnosis not present

## 2023-11-14 DIAGNOSIS — K449 Diaphragmatic hernia without obstruction or gangrene: Secondary | ICD-10-CM | POA: Diagnosis not present

## 2023-11-14 DIAGNOSIS — K2970 Gastritis, unspecified, without bleeding: Secondary | ICD-10-CM | POA: Diagnosis not present

## 2023-11-14 HISTORY — PX: BIOPSY: SHX5522

## 2023-11-14 HISTORY — PX: COLONOSCOPY WITH PROPOFOL: SHX5780

## 2023-11-14 HISTORY — PX: ESOPHAGOGASTRODUODENOSCOPY (EGD) WITH PROPOFOL: SHX5813

## 2023-11-14 SURGERY — COLONOSCOPY WITH PROPOFOL
Anesthesia: General

## 2023-11-14 MED ORDER — PROPOFOL 500 MG/50ML IV EMUL
INTRAVENOUS | Status: DC | PRN
  Administered 2023-11-14: 165 ug/kg/min via INTRAVENOUS

## 2023-11-14 MED ORDER — GLYCOPYRROLATE 0.2 MG/ML IJ SOLN
INTRAMUSCULAR | Status: DC | PRN
  Administered 2023-11-14: .2 mg via INTRAVENOUS

## 2023-11-14 MED ORDER — LIDOCAINE HCL (CARDIAC) PF 100 MG/5ML IV SOSY
PREFILLED_SYRINGE | INTRAVENOUS | Status: DC | PRN
  Administered 2023-11-14: 100 mg via INTRAVENOUS

## 2023-11-14 MED ORDER — SODIUM CHLORIDE 0.9 % IV SOLN: INTRAVENOUS | Status: DC

## 2023-11-14 MED ORDER — PROPOFOL 1000 MG/100ML IV EMUL
INTRAVENOUS | Status: AC
  Filled 2023-11-14: qty 600

## 2023-11-14 MED ORDER — LIDOCAINE HCL (PF) 2 % IJ SOLN
INTRAMUSCULAR | Status: AC
Start: 2023-11-14 — End: ?
  Filled 2023-11-14: qty 5

## 2023-11-14 MED ORDER — PROPOFOL 1000 MG/100ML IV EMUL
INTRAVENOUS | Status: AC
  Filled 2023-11-14: qty 100

## 2023-11-14 MED ORDER — PROPOFOL 10 MG/ML IV BOLUS
INTRAVENOUS | Status: DC | PRN
  Administered 2023-11-14: 70 mg via INTRAVENOUS
  Administered 2023-11-14 (×3): 20 mg via INTRAVENOUS
  Administered 2023-11-14: 10 mg via INTRAVENOUS

## 2023-11-14 NOTE — H&P (Signed)
Wyline Mood, MD [address], [address], [place], [place], [address] [address], [place], [place], [address] Phone: [phone]  Fax: [phone]  Primary Care Physician:  Sherlene Shams, MD   Pre-Procedure History & Physical: HPI:  Destiny Martin is a 62 y.o. 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   GERD (gastroesophageal reflux disease)    Hypertension    Osteoarthritis of carpometacarpal (CMC) joint of thumb 02/20/2019   Pain in right hand 02/20/2019    Past Surgical History:  Procedure Laterality Date   COLONOSCOPY WITH PROPOFOL N/A 03/27/2017   Procedure: COLONOSCOPY WITH PROPOFOL;  Surgeon: Earline Mayotte, MD;  Location: [facility];  Service: Endoscopy;  Laterality: N/A;   ESOPHAGOGASTRODUODENOSCOPY (EGD) WITH PROPOFOL N/A 03/27/2017   Procedure: ESOPHAGOGASTRODUODENOSCOPY (EGD) WITH PROPOFOL;  Surgeon: Earline Mayotte, MD;  Location: [facility] END[facility]COPY;  Service: Endoscopy;  Laterality: N/A;   ESOPHAGOGASTRODUODENOSCOPY (EGD) WITH PROPOFOL N/A 03/17/2020   Procedure: ESOPHAGOGASTRODUODENOSCOPY (EGD) WITH PROPOFOL;  Surgeon: Earline Mayotte, MD;  Location: [facility] ENDOSCOPY;  Service: Endoscopy;  Laterality: N/A;  also for excision anal tag in endo   ESOPHAGOGASTRODUODENOSCOPY ENDOSCOPY     2008    FOOT SURGERY Right 2007   JOINT REPLACEMENT  11-18-16   surgery on vertibra   NECK SURGERY  2016   TONSILLECTOMY  1974    Prior to Admission medications   Medication Sig Start Date End Date Taking? Authorizing Provider  cyanocobalamin (VITAMIN B12) 1000 MCG/ML injection INJECT 1 ML (1,000 MCG) INTRAMUSCULARLY EVERY 30 DAYS 08/10/23  Yes Sherlene Shams, MD  ALPRAZolam Prudy Feeler) 0.5 MG tablet TAKE 1 TABLET (0.5 MG TOTAL) BY MOUTH AT BEDTIME AS NEEDED. 10/24/23   Sherlene Shams,  MD  amLODipine (NORVASC) 2.5 MG tablet Take 1 tablet (2.5 mg total) by mouth daily. Patient not taking: Reported on 11/07/2023 08/22/23   Sherlene Shams, MD  amphetamine-dextroamphetamine (ADDERALL XR) 30 MG 24 hr capsule Take 30 mg by mouth. 05/05/18   [provider]  amphetamine-dextroamphetamine (ADDERALL) 30 MG tablet Take 1 tablet by mouth 2 (two) times daily. 05/05/18   [provider]  butalbital-acetaminophen-caffeine (FIORICET) 50-325-40 MG tablet TAKE 1 TABLET BY MOUTH EVERY 6 HOURS AS NEEDED FOR HEADACHE. Patient not taking: Reported on 11/07/2023 10/24/23   Sherlene Shams, MD  Calcium-Vitamin D-Vitamin K (VIACTIV CALCIUM PLUS D) 650-12.5-40 MG-MCG-MCG CHEW Chew by mouth.    [provider]  celecoxib (CELEBREX) 200 MG capsule Take 200 mg by mouth 2 (two) times daily. Patient not taking: Reported on 11/07/2023 05/01/23   [provider]  Cholecalciferol (D3 2000 PO) Take 2,000 Units by mouth.    [provider]  conjugated estrogens (PREMARIN) vaginal cream Place 1 Applicatorful vaginally daily. 05/05/20   Sherlene Shams, MD  esomeprazole (NEXIUM) 40 MG capsule TAKE 1 CAPSULE (40 MG TOTAL) BY MOUTH IN THE MORNING 06/11/23   Sherlene Shams, MD  FLUoxetine (PROZAC) 40 MG capsule Take 40 mg by mouth daily. Patient not taking: Reported on 11/07/2023 06/20/19   [provider]  Iron, Ferrous Sulfate, 325 (65 Fe) MG TABS Take 325 mg by mouth daily. Patient not taking: Reported on 11/07/2023 08/21/23   Sherlene Shams, MD  Multiple Vitamin (MULTIVITAMIN ADULT PO)  Take by mouth.    [provider]  ondansetron (ZOFRAN-ODT) 4 MG disintegrating tablet TAKE 1 TABLET BY MOUTH EVERY 8 HOURS AS NEEDED FOR NAUSEA AND VOMITING 10/10/21   Sherlene Shams, MD  tiZANidine (ZANAFLEX) 4 MG capsule TAKE 1 CAPSULE BY MOUTH 3 TIMES DAILY AS NEEDED FOR MUSCLE SPASMS. 04/06/22   Sherlene Shams, MD  traMADol (ULTRAM) 50 MG tablet TAKE 1 TABLET BY MOUTH EVERY 8  HOURS AS NEEDED 10/24/23   Sherlene Shams, MD  traZODone (DESYREL) 50 MG tablet TAKE 0.5-1 TABLETS (25-50 MG TOTAL) BY MOUTH AT BEDTIME AS NEEDED FOR SLEEP. (NEED APPT) 01/28/21   Sherlene Shams, MD  TURMERIC PO Take 1,000 mg by mouth. Patient not taking: Reported on 11/07/2023    [provider]  VAGIFEM 10 MCG TABS vaginal tablet INSERT ONE TABLET VAGINALLY NIGHTLY FOR 2 WEEKS, THEN WEEKLY THEREAFTER 06/22/22   Sherlene Shams, MD    Allergies as of 10/09/2023 - Review Complete 10/09/2023  Allergen Reaction Noted   Erythromycin Nausea Only 09/06/2011   Lisinopril Swelling 01/11/2015   Penicillins  09/06/2011   Sulfa antibiotics Rash 09/06/2011    Family History  Problem Relation Age of Onset   Hypertension Father    Heart disease Father    Prostate cancer Father    Cancer Father    Hearing loss Father    Stroke Father    Heart disease Mother    Hypertension Mother    Arthritis Paternal Grandmother    Breast cancer Neg Hx     Social History   Socioeconomic History   Marital status: Married    Spouse name: Not on file   Number of children: Not on file   Years of education: Not on file   Highest education level: Not on file  Occupational History   Not on file  Tobacco Use   Smoking status: Never   Smokeless tobacco: Never  Vaping Use   Vaping status: Never Used  Substance and Sexual Activity   Alcohol use: Yes    Comment: I may have a drink but not often.   Drug use: No   Sexual activity: Yes    Birth control/protection: None  Other Topics Concern   Not on file  Social History Narrative   Not on file   Social Determinants of Health   Financial Resource Strain: Not on file  Food Insecurity: No Food Insecurity (08/29/2023)   Hunger Vital Sign    Worried About Running Out of Food in the Last Year: Never true    Ran Out of Food in the Last Year: Never true  Transportation Needs: No Transportation Needs (08/29/2023)   PRAPARE - Scientist, research (physical sciences) (Medical): No    Lack of Transportation (Non-Medical): No  Physical Activity: Not on file  Stress: Not on file  Social Connections: Not on file  Intimate Partner Violence: Not At Risk (08/29/2023)   Humiliation, Afraid, Rape, and Kick questionnaire    Fear of Current or Ex-Partner: No    Emotionally Abused: No    Physically Abused: No    Sexually Abused: No    Review of Systems: See HPI, otherwise negative ROS  Physical Exam: LMP 01/15/2018 (Approximate)  General:   Alert,  pleasant and cooperative in NAD Head:  Normocephalic and atraumatic. Neck:  Supple; no masses or thyromegaly. Lungs:  Clear throughout to auscultation, normal respiratory effort.    Heart:  +S1, +S2, Regular rate and  rhythm, No edema. Abdomen:  Soft, nontender and nondistended. Normal bowel sounds, without guarding, and without rebound.   Neurologic:  Alert and  oriented x4;  grossly normal neurologically.  Impression/Plan: DELLAH TISLER is here for an endoscopy and colonoscopy  to be performed for  evaluation of iron deficiency anemia    Risks, benefits, limitations, and alternatives regarding endoscopy have been reviewed with the patient.  Questions have been answered.  All parties agreeable.   Wyline Mood, MD  11/14/2023, 8:13 AM

## 2023-11-14 NOTE — Anesthesia Preprocedure Evaluation (Signed)
Anesthesia Evaluation  Patient identified by MRN, date of birth, ID band Patient awake    Reviewed: Allergy & Precautions, H&P , NPO status , Patient's Chart, lab work & pertinent test results, reviewed documented beta blocker date and time   Airway Mallampati: II   Neck ROM: full    Dental  (+) Poor Dentition   Pulmonary sleep apnea and Continuous Positive Airway Pressure Ventilation    Pulmonary exam normal        Cardiovascular Exercise Tolerance: Good hypertension, On Medications negative cardio ROS Normal cardiovascular exam Rhythm:regular Rate:Normal     Neuro/Psych  Headaches PSYCHIATRIC DISORDERS Anxiety      Neuromuscular disease    GI/Hepatic Neg liver ROS,GERD  Medicated,,  Endo/Other  negative endocrine ROS    Renal/GU negative Renal ROS  negative genitourinary   Musculoskeletal   Abdominal   Peds  Hematology  (+) Blood dyscrasia, anemia   Anesthesia Other Findings Past Medical History: No date: ADD (attention deficit disorder) No date: Allergy 2014: Anemia 06/16/2023: Arthritis of hand 03/02/2017: Complicated grief No date: GERD (gastroesophageal reflux disease) No date: Hypertension 02/20/2019: Osteoarthritis of carpometacarpal [facility]) joint of thumb 02/20/2019: Pain in right hand Past Surgical History: 03/27/2017: COLONOSCOPY WITH PROPOFOL; N/A     Comment:  Procedure: COLONOSCOPY WITH PROPOFOL;  Surgeon: Earline Mayotte, MD;  Location: [facility] END[facility]COPY;  Service:               Endoscopy;  Laterality: N/A; 03/27/2017: ESOPHAGOGASTRODUODENOSCOPY (EGD) WITH PROPOFOL; N/A     Comment:  Procedure: ESOPHAGOGASTRODUODENOSCOPY (EGD) WITH               PROPOFOL;  Surgeon: Earline Mayotte, MD;  Location:               [facility];  Service: Endoscopy;  Laterality: N/A; 03/17/2020: ESOPHAGOGASTRODUODENOSCOPY (EGD) WITH PROPOFOL; N/A     Comment:  Procedure: ESOPHAGOGASTRODUODENOSCOPY  (EGD) WITH               PROPOFOL;  Surgeon: Earline Mayotte, MD;  Location:               [facility];  Service: Endoscopy;  Laterality: N/A;                also for excision anal tag in endo No date: ESOPHAGOGASTRODUODENOSCOPY ENDOSCOPY     Comment:  2008  2007: FOOT SURGERY; Right 11-18-16: JOINT REPLACEMENT     Comment:  surgery on vertibra 2016: NECK SURGERY 1974: TONSILLECTOMY BMI    Body Mass Index: 27.44 kg/m     Reproductive/Obstetrics negative OB ROS                             Anesthesia Physical Anesthesia Plan  ASA: 3  Anesthesia Plan: General   Post-op Pain Management:    Induction:   PONV Risk Score and Plan:   Airway Management Planned:   Additional Equipment:   Intra-op Plan:   Post-operative Plan:   Informed Consent: I have reviewed the patients History and Physical, chart, labs and discussed the procedure including the risks, benefits and alternatives for the proposed anesthesia with the patient or authorized representative who has indicated his/her understanding and acceptance.     Dental Advisory Given  Plan Discussed with: CRNA  Anesthesia Plan Comments:        Anesthesia Quick Evaluation

## 2023-11-14 NOTE — Transfer of Care (Signed)
Immediate Anesthesia Transfer of Care Note  Patient: Destiny Martin  Procedure(s) Performed: COLONOSCOPY WITH PROPOFOL ESOPHAGOGASTRODUODENOSCOPY (EGD) WITH PROPOFOL BIOPSY  Patient Location: Endoscopy Unit  Anesthesia Type:General  Level of Consciousness: drowsy and patient cooperative  Airway & Oxygen Therapy: Patient Spontanous Breathing and Patient connected to face mask oxygen  Post-op Assessment: Report given to RN and Post -op Vital signs reviewed and stable  Post vital signs: Reviewed and stable  Last Vitals:  Vitals Value Taken Time  BP 101/72 11/14/23 0852  Temp 36.1 C 11/14/23 0851  Pulse 74 11/14/23 0853  Resp    SpO2 100 % 11/14/23 0853  Vitals shown include unfiled device data.  Last Pain:  Vitals:   11/14/23 0851  TempSrc: Temporal  PainSc: Asleep         Complications: No notable events documented.

## 2023-11-14 NOTE — Anesthesia Postprocedure Evaluation (Signed)
Anesthesia Post Note  Patient: Destiny Martin  Procedure(s) Performed: COLONOSCOPY WITH PROPOFOL ESOPHAGOGASTRODUODENOSCOPY (EGD) WITH PROPOFOL BIOPSY  Patient location during evaluation: PACU Anesthesia Type: General Level of consciousness: awake and alert Pain management: pain level controlled Vital Signs Assessment: post-procedure vital signs reviewed and stable Respiratory status: spontaneous breathing, nonlabored ventilation, respiratory function stable and patient connected to nasal cannula oxygen Cardiovascular status: blood pressure returned to baseline and stable Postop Assessment: no apparent nausea or vomiting Anesthetic complications: no   No notable events documented.   Last Vitals:  Vitals:   11/14/23 0904 11/14/23 0915  BP: (!) 117/92 110/75  Pulse: 90 77  Resp:    Temp:    SpO2: 94% 100%    Last Pain:  Vitals:   11/14/23 0915  TempSrc:   PainSc: 0-No pain                 Yevette Edwards

## 2023-11-14 NOTE — Op Note (Signed)
[facility] Gastroenterology Patient Name: Destiny Martin Procedure Date: 11/14/2023 8:08 AM MRN: [id number] Account #: [id number] Date of Birth: [birth year] Admit Type: Outpatient Age: 62 Room: [facility] ENDO ROOM 3 Gender: Female Note Status: Finalized Instrument Name: Upper Endoscope [id number] Procedure:             Upper GI endoscopy Indications:           Iron deficiency anemia Providers:             Wyline Mood MD, MD Referring MD:          Wyline Mood MD, MD (Referring MD), Duncan Dull, MD                         (Referring MD) Medicines:             Monitored Anesthesia Care Complications:         No immediate complications. Procedure:             Pre-Anesthesia Assessment:                        - Prior to the procedure, a History and Physical was                         performed, and patient medications, allergies and                         sensitivities were reviewed. The patient's tolerance                         of previous anesthesia was reviewed.                        - The risks and benefits of the procedure and the                         sedation options and risks were discussed with the                         patient. All questions were answered and informed                         consent was obtained.                        - ASA Grade Assessment: II - A patient with mild                         systemic disease.                        After obtaining informed consent, the endoscope was                         passed under direct vision. Throughout the procedure,                         the patient's blood pressure, pulse, and oxygen                         saturations  were monitored continuously. The Endoscope                         was introduced through the mouth, and advanced to the                         third part of duodenum. The upper GI endoscopy was                         accomplished with ease. The patient tolerated the                          procedure well. Findings:      The esophagus was normal.      A large hiatal hernia was present.      Diffuse moderate inflammation characterized by congestion (edema) and       erythema was found on the greater curvature of the gastric antrum.       Biopsies were taken with a cold forceps for histology.      The examined duodenum was normal. Biopsies were taken with a cold       forceps for histology.      The cardia and gastric fundus were normal on retroflexion. Impression:            - Normal esophagus.                        - Large hiatal hernia.                        - Gastritis. Biopsied.                        - Normal examined duodenum. Biopsied. Recommendation:        - Await pathology results.                        - Perform a colonoscopy today. Procedure Code(s):     --- Professional ---                        [phone], Esophagogastroduodenoscopy, flexible,                         transoral; with biopsy, single or multiple Diagnosis Code(s):     --- Professional ---                        K44.9, Diaphragmatic hernia without obstruction or                         gangrene                        K29.70, Gastritis, unspecified, without bleeding                        D50.9, Iron deficiency anemia, unspecified CPT copyright 2022 American Medical Association. All rights reserved. The codes documented in this report are preliminary and upon coder review may  be revised to meet current compliance requirements. Wyline Mood, MD Wyline Mood MD, MD 11/14/2023 8:35:05 AM This report has been signed electronically.  Number of Addenda: 0 Note Initiated On: 11/14/2023 8:08 AM Estimated Blood Loss:  Estimated blood loss: none.      [facility]

## 2023-11-14 NOTE — Op Note (Signed)
[facility] Gastroenterology Patient Name: Destiny Martin Procedure Date: 11/14/2023 8:07 AM MRN: [id number] Account #: [id number] Date of Birth: [birth year] Admit Type: Outpatient Age: 62 Room: [facility] ENDO ROOM 3 Gender: Female Note Status: Finalized Instrument Name: Prentice Docker [id number] Procedure:             Colonoscopy Indications:           Iron deficiency anemia Providers:             Wyline Mood MD, MD Referring MD:          Wyline Mood MD, MD (Referring MD), Duncan Dull, MD                         (Referring MD) Medicines:             Monitored Anesthesia Care Complications:         No immediate complications. Procedure:             Pre-Anesthesia Assessment:                        - Prior to the procedure, a History and Physical was                         performed, and patient medications, allergies and                         sensitivities were reviewed. The patient's tolerance                         of previous anesthesia was reviewed.                        - The risks and benefits of the procedure and the                         sedation options and risks were discussed with the                         patient. All questions were answered and informed                         consent was obtained.                        - ASA Grade Assessment: II - A patient with mild                         systemic disease.                        After obtaining informed consent, the colonoscope was                         passed under direct vision. Throughout the procedure,                         the patient's blood pressure, pulse, and oxygen                         saturations were monitored continuously.  The                         Colonoscope was introduced through the anus and                         advanced to the the cecum, identified by the                         appendiceal orifice. The colonoscopy was performed                         with ease. The  patient tolerated the procedure well.                         The quality of the bowel preparation was excellent.                         The ileocecal valve, appendiceal orifice, and rectum                         were photographed. Findings:      The perianal and digital rectal examinations were normal.      The entire examined colon appeared normal on direct and retroflexion       views. Impression:            - The entire examined colon is normal on direct and                         retroflexion views.                        - No specimens collected. Recommendation:        - Discharge patient to home (with escort).                        - Resume previous diet.                        - Continue present medications.                        - Repeat colonoscopy in 10 years for screening                         purposes. Procedure Code(s):     --- Professional ---                        [phone], Colonoscopy, flexible; diagnostic, including                         collection of specimen(s) by brushing or washing, when                         performed (separate procedure) Diagnosis Code(s):     --- Professional ---                        D50.9, Iron deficiency anemia, unspecified CPT copyright 2022 American Medical Association. All rights reserved. The codes documented in this report  are preliminary and upon coder review may  be revised to meet current compliance requirements. Wyline Mood, MD Wyline Mood MD, MD 11/14/2023 8:49:35 AM This report has been signed electronically. Number of Addenda: 0 Note Initiated On: 11/14/2023 8:07 AM Scope Withdrawal Time: 0 hours 8 minutes 7 seconds  Total Procedure Duration: 0 hours 10 minutes 56 seconds  Estimated Blood Loss:  Estimated blood loss: none.      [facility]

## 2023-11-15 ENCOUNTER — Encounter: Payer: Self-pay | Admitting: Gastroenterology

## 2023-11-16 LAB — SURGICAL PATHOLOGY

## 2023-11-23 ENCOUNTER — Telehealth: Payer: Self-pay | Admitting: Pharmacy Technician

## 2023-11-23 ENCOUNTER — Encounter: Payer: Self-pay | Admitting: Oncology

## 2023-11-23 ENCOUNTER — Other Ambulatory Visit (HOSPITAL_COMMUNITY): Payer: Self-pay

## 2023-11-23 NOTE — Telephone Encounter (Signed)
Pharmacy Patient Advocate Encounter   Received notification from CoverMyMeds that prior authorization for tramadol is required/requested.   Insurance verification completed.   The patient is insured through [employer] .   Per test claim: PA required; PA submitted to above mentioned insurance via CoverMyMeds Key/confirmation #/EOC [id number] Status is pending

## 2023-11-23 NOTE — Telephone Encounter (Signed)
Pharmacy Patient Advocate Encounter  Received notification from [employer] that Prior Authorization for TRAMADOL 50MG  has been APPROVED from 11/23/23 to 05/23/24. Spoke to pharmacy to process.Copay is $1.05.    PA #/Case ID/Reference #: [id number]

## 2023-11-23 NOTE — Telephone Encounter (Signed)
Pt is aware and gave a verbal understanding.  

## 2023-11-30 ENCOUNTER — Encounter: Payer: Self-pay | Admitting: Gastroenterology

## 2023-12-03 ENCOUNTER — Encounter: Payer: Self-pay | Admitting: Internal Medicine

## 2023-12-03 ENCOUNTER — Encounter: Payer: Self-pay | Admitting: Oncology

## 2023-12-04 MED ORDER — TIZANIDINE HCL 4 MG PO CAPS: ORAL_CAPSULE | ORAL | 1 refills | Status: DC

## 2023-12-04 NOTE — Telephone Encounter (Signed)
 Refilled: 04/06/2022 Last OV: 10/11/2023 Next OV: 04/09/2024

## 2023-12-24 ENCOUNTER — Other Ambulatory Visit: Payer: Self-pay | Admitting: Internal Medicine

## 2023-12-25 ENCOUNTER — Other Ambulatory Visit: Payer: Self-pay | Admitting: Family

## 2023-12-26 ENCOUNTER — Other Ambulatory Visit: Payer: Self-pay | Admitting: *Deleted

## 2023-12-26 DIAGNOSIS — D509 Iron deficiency anemia, unspecified: Secondary | ICD-10-CM

## 2023-12-26 NOTE — Progress Notes (Signed)
cb

## 2023-12-27 ENCOUNTER — Inpatient Hospital Stay: Payer: Self-pay | Attending: Oncology

## 2023-12-27 ENCOUNTER — Encounter: Payer: Self-pay | Admitting: Oncology

## 2023-12-27 DIAGNOSIS — I10 Essential (primary) hypertension: Secondary | ICD-10-CM | POA: Diagnosis not present

## 2023-12-27 DIAGNOSIS — D75839 Thrombocytosis, unspecified: Secondary | ICD-10-CM | POA: Diagnosis not present

## 2023-12-27 DIAGNOSIS — D509 Iron deficiency anemia, unspecified: Secondary | ICD-10-CM | POA: Diagnosis present

## 2023-12-27 DIAGNOSIS — Z8042 Family history of malignant neoplasm of prostate: Secondary | ICD-10-CM | POA: Diagnosis not present

## 2023-12-27 LAB — IRON AND TIBC
Iron: 102 ug/dL (ref 28–170)
Saturation Ratios: 24 % (ref 10.4–31.8)
TIBC: 420 ug/dL (ref 250–450)
UIBC: 318 ug/dL

## 2023-12-27 LAB — CBC WITH DIFFERENTIAL/PLATELET
Abs Immature Granulocytes: 0.01 10*3/uL (ref 0.00–0.07)
Basophils Absolute: 0 10*3/uL (ref 0.0–0.1)
Basophils Relative: 1 %
Eosinophils Absolute: 0.2 10*3/uL (ref 0.0–0.5)
Eosinophils Relative: 3 %
HCT: 40.8 % (ref 36.0–46.0)
Hemoglobin: 13.6 g/dL (ref 12.0–15.0)
Immature Granulocytes: 0 %
Lymphocytes Relative: 36 %
Lymphs Abs: 1.9 10*3/uL (ref 0.7–4.0)
MCH: 30.4 pg (ref 26.0–34.0)
MCHC: 33.3 g/dL (ref 30.0–36.0)
MCV: 91.1 fL (ref 80.0–100.0)
Monocytes Absolute: 0.4 10*3/uL (ref 0.1–1.0)
Monocytes Relative: 7 %
Neutro Abs: 2.8 10*3/uL (ref 1.7–7.7)
Neutrophils Relative %: 53 %
Platelets: 413 10*3/uL — ABNORMAL HIGH (ref 150–400)
RBC: 4.48 MIL/uL (ref 3.87–5.11)
RDW: 13.4 % (ref 11.5–15.5)
WBC: 5.2 10*3/uL (ref 4.0–10.5)
nRBC: 0 % (ref 0.0–0.2)

## 2023-12-27 LAB — FERRITIN: Ferritin: 78 ng/mL (ref 11–307)

## 2023-12-31 ENCOUNTER — Encounter: Payer: Self-pay | Admitting: Oncology

## 2023-12-31 ENCOUNTER — Inpatient Hospital Stay (HOSPITAL_BASED_OUTPATIENT_CLINIC_OR_DEPARTMENT_OTHER): Payer: 59 | Admitting: Oncology

## 2023-12-31 ENCOUNTER — Inpatient Hospital Stay: Payer: 59

## 2023-12-31 VITALS — BP 147/93 | HR 95 | Temp 99.2°F | Resp 16 | Ht 61.0 in | Wt 144.0 lb

## 2023-12-31 DIAGNOSIS — D509 Iron deficiency anemia, unspecified: Secondary | ICD-10-CM

## 2023-12-31 NOTE — Progress Notes (Unsigned)
[facility]  Telephone:([phone]) [phone] Fax:(336) [phone]  ID: Destiny Martin OB: 06/10/1961  MR#: [id number]  XLK#:[id number]  Patient Care Team: Sherlene Shams, MD as PCP - General (Internal Medicine) Sherlene Shams, MD (Internal Medicine)  CHIEF COMPLAINT: Iron deficiency anemia.  INTERVAL HISTORY: Patient returns to clinic today for repeat laboratory work, further evaluation, and consideration of additional IV Venofer.  She feels improved after receiving iron several months ago.  She does not complain of any weakness or fatigue today.  She has no neurologic complaints.  She denies any recent fevers or illnesses.  She has a good appetite and denies weight loss.  She has no chest pain, shortness of breath, cough, or hemoptysis.  She has no nausea, vomiting, constipation, or diarrhea.  She has no melena or hematochezia.  She has no urinary complaints.  She offers no specific complaints today.  REVIEW OF SYSTEMS:   Review of Systems  Constitutional: Negative.  Negative for fever, malaise/fatigue and weight loss.  Respiratory: Negative.  Negative for cough, hemoptysis and shortness of breath.   Cardiovascular:  Negative for chest pain and leg swelling.  Gastrointestinal: Negative.  Negative for abdominal pain, blood in stool and melena.  Genitourinary: Negative.  Negative for hematuria.  Musculoskeletal: Negative.  Negative for back pain.  Skin: Negative.   Neurological: Negative.  Negative for dizziness, focal weakness, weakness and headaches.  Psychiatric/Behavioral: Negative.  The patient is not nervous/anxious.     As per HPI. Otherwise, a complete review of systems is negative.  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   GERD (gastroesophageal reflux disease)    Hypertension    Osteoarthritis of carpometacarpal  (CMC) joint of thumb 02/20/2019   Pain in right hand 02/20/2019    PAST SURGICAL HISTORY: Past Surgical History:  Procedure Laterality Date   BIOPSY  11/14/2023   Procedure: BIOPSY;  Surgeon: Wyline Mood, MD;  [facility] ENDOSCOPY;  Service: Gastroenterology;;   COLONOSCOPY WITH PROPOFOL N/A 03/27/2017   Procedure: COLONOSCOPY WITH PROPOFOL;  Surgeon: Earline Mayotte, MD;  Location: [facility] ENDOSCOPY;  Service: Endoscopy;  Laterality: N/A;   COLONOSCOPY WITH PROPOFOL N/A 11/14/2023   Procedure: COLONOSCOPY WITH PROPOFOL;  Surgeon: Wyline Mood, MD;  Location: [facility] ENDOSCOPY;  Service: Gastroenterology;  Laterality: N/A;   ESOPHAGOGASTRODUODENOSCOPY (EGD) WITH PROPOFOL N/A 03/27/2017   Procedure: ESOPHAGOGASTRODUODENOSCOPY (EGD) WITH PROPOFOL;  Surgeon: Earline Mayotte, MD;  Location: [facility] ENDOSCOPY;  Service: Endoscopy;  Laterality: N/A;   ESOPHAGOGASTRODUODENOSCOPY (EGD) WITH PROPOFOL N/A 03/17/2020   Procedure: ESOPHAGOGASTRODUODENOSCOPY (EGD) WITH PROPOFOL;  Surgeon: Earline Mayotte, MD;  Location: [facility] ENDOSCOPY;  Service: Endoscopy;  Laterality: N/A;  also for excision anal tag in endo   ESOPHAGOGASTRODUODENOSCOPY (EGD) WITH PROPOFOL N/A 11/14/2023   Procedure: ESOPHAGOGASTRODUODENOSCOPY (EGD) WITH PROPOFOL;  Surgeon: Wyline Mood, MD;  Location: [facility] ENDOSCOPY;  Service: Gastroenterology;  Laterality: N/A;   ESOPHAGOGASTRODUODENOSCOPY ENDOSCOPY     2008    FOOT SURGERY Right 2007   JOINT REPLACEMENT  11-18-16   surgery on vertibra   NECK SURGERY  2016   TONSILLECTOMY  1974    FAMILY HISTORY: Family History  Problem Relation Age of Onset   Hypertension Father    Heart disease Father    Prostate cancer Father    Cancer Father    Hearing loss Father  Stroke Father    Heart disease Mother    Hypertension Mother    Arthritis Paternal Grandmother    Breast cancer Neg Hx     ADVANCED DIRECTIVES (Y/N):  N  HEALTH MAINTENANCE: Social History   Tobacco Use    Smoking status: Never   Smokeless tobacco: Never  Vaping Use   Vaping status: Never Used  Substance Use Topics   Alcohol use: Yes    Comment: I may have a drink but not often.   Drug use: No     Colonoscopy:  PAP:  Bone density:  Lipid panel:  Allergies  Allergen Reactions   Erythromycin Nausea Only   Lisinopril Swelling    Angio-edema   Penicillins    Sulfa Antibiotics Rash    Current Outpatient Medications  Medication Sig Dispense Refill   ALPRAZolam (XANAX) 0.5 MG tablet TAKE 1 TABLET (0.5 MG TOTAL) BY MOUTH AT BEDTIME AS NEEDED. 20 tablet 2   amLODipine (NORVASC) 2.5 MG tablet Take 1 tablet (2.5 mg total) by mouth daily. (Patient not taking: Reported on 12/31/2023) 90 tablet 1   amphetamine-dextroamphetamine (ADDERALL XR) 30 MG 24 hr capsule Take 30 mg by mouth.  0   amphetamine-dextroamphetamine (ADDERALL) 30 MG tablet Take 1 tablet by mouth 2 (two) times daily.  0   butalbital-acetaminophen-caffeine (FIORICET) 50-325-40 MG tablet TAKE 1 TABLET BY MOUTH EVERY 6 HOURS AS NEEDED FOR HEADACHE. (Patient not taking: No sig reported) 48 tablet 2   Calcium-Vitamin D-Vitamin K (VIACTIV CALCIUM PLUS D) 650-12.5-40 MG-MCG-MCG CHEW Chew by mouth.     celecoxib (CELEBREX) 200 MG capsule Take 200 mg by mouth 2 (two) times daily. (Patient not taking: Reported on 11/07/2023)     Cholecalciferol (D3 2000 PO) Take 2,000 Units by mouth.     conjugated estrogens (PREMARIN) vaginal cream Place 1 Applicatorful vaginally daily. 42.5 g 12   cyanocobalamin (VITAMIN B12) 1000 MCG/ML injection INJECT 1 ML (1,000 MCG) INTRAMUSCULARLY EVERY 30 DAYS 3 mL 3   esomeprazole (NEXIUM) 40 MG capsule TAKE 1 CAPSULE (40 MG TOTAL) BY MOUTH IN THE MORNING 90 capsule 3   FLUoxetine (PROZAC) 40 MG capsule Take 40 mg by mouth daily. (Patient not taking: Reported on 11/07/2023)     Iron, Ferrous Sulfate, 325 (65 Fe) MG TABS Take 325 mg by mouth daily. (Patient not taking: Reported on 11/07/2023) 90 tablet 1   Multiple  Vitamin (MULTIVITAMIN ADULT PO) Take by mouth.     ondansetron (ZOFRAN-ODT) 4 MG disintegrating tablet TAKE 1 TABLET BY MOUTH EVERY 8 HOURS AS NEEDED FOR NAUSEA AND VOMITING 18 tablet 1   tiZANidine (ZANAFLEX) 4 MG capsule TAKE 1 CAPSULE BY MOUTH 3 TIMES DAILY AS NEEDED FOR MUSCLE SPASMS. 270 capsule 0   traMADol (ULTRAM) 50 MG tablet TAKE 1 TABLET BY MOUTH EVERY 8 HOURS AS NEEDED 30 tablet 2   traZODone (DESYREL) 50 MG tablet TAKE 0.5-1 TABLETS (25-50 MG TOTAL) BY MOUTH AT BEDTIME AS NEEDED FOR SLEEP. (NEED APPT) 270 tablet 1   TURMERIC PO Take 1,000 mg by mouth. (Patient not taking: Reported on 11/07/2023)     VAGIFEM 10 MCG TABS vaginal tablet INSERT ONE TABLET VAGINALLY NIGHTLY FOR 2 WEEKS, THEN WEEKLY THEREAFTER 24 tablet 3   No current facility-administered medications for this visit.    OBJECTIVE: Vitals:   12/31/23 1316  BP: (!) 147/93  Pulse: 95  Resp: 16  Temp: 99.2 F (37.3 C)  SpO2: 97%     Body mass index is 27.21 kg/m.  ECOG FS:0 - Asymptomatic  General: Well-developed, well-nourished, no acute distress. Eyes: Pink conjunctiva, anicteric sclera. HEENT: Normocephalic, moist mucous membranes. Lungs: No audible wheezing or coughing. Heart: Regular rate and rhythm. Abdomen: Soft, nontender, no obvious distention. Musculoskeletal: No edema, cyanosis, or clubbing. Neuro: Alert, answering all questions appropriately. Cranial nerves grossly intact. Skin: No rashes or petechiae noted. Psych: Normal affect.   LAB RESULTS:  Lab Results  Component Value Date   NA 140 08/16/2023   K 4.3 08/16/2023   CL 103 08/16/2023   CO2 27 08/16/2023   GLUCOSE 91 08/16/2023   BUN 16 08/16/2023   CREATININE 0.68 08/16/2023   CALCIUM 9.2 08/16/2023   PROT 6.5 08/16/2023   ALBUMIN 4.1 08/16/2023   AST 16 08/16/2023   ALT 8 08/16/2023   ALKPHOS 53 08/16/2023   BILITOT 0.3 08/16/2023    Lab Results  Component Value Date   WBC 5.2 12/27/2023   NEUTROABS 2.8 12/27/2023   HGB  13.6 12/27/2023   HCT 40.8 12/27/2023   MCV 91.1 12/27/2023   PLT 413 (H) 12/27/2023   Lab Results  Component Value Date   IRON 102 12/27/2023   TIBC 420 12/27/2023   IRONPCTSAT 24 12/27/2023   Lab Results  Component Value Date   FERRITIN 78 12/27/2023     STUDIES: No results found.  ASSESSMENT: Iron deficiency anemia.  PLAN:    Iron deficiency anemia: Patient's hemoglobin and iron stores are now within normal limits.  Patient noted to have a reduced hemoglobin and iron stores and is symptomatic.  Her last colonoscopy was in 2018 and previously, a referral was sent to GI for repeat luminal evaluation.  Patient does not require additional IV Venofer today.  She last received treatment on September 21, 2023.  No intervention is needed at this time.  Return to clinic in 4 months with repeat laboratory work, further evaluation, and additional IV iron if necessary.   Thrombocytosis: Mild, monitor. Hypertension: Patient's blood pressure is mildly elevated today.  Continue monitoring and treatment per primary care.    Patient expressed understanding and was in agreement with this plan. She also understands that She can call clinic at any time with any questions, concerns, or complaints.    Jeralyn Ruths, MD   12/31/2023 1:33 PM

## 2024-01-01 ENCOUNTER — Encounter: Payer: Self-pay | Admitting: Oncology

## 2024-02-07 ENCOUNTER — Other Ambulatory Visit: Payer: Self-pay | Admitting: Internal Medicine

## 2024-03-22 ENCOUNTER — Other Ambulatory Visit: Payer: Self-pay | Admitting: Internal Medicine

## 2024-04-09 ENCOUNTER — Ambulatory Visit: Payer: BC Managed Care – PPO | Admitting: Internal Medicine

## 2024-04-09 ENCOUNTER — Encounter: Payer: Self-pay | Admitting: Internal Medicine

## 2024-04-09 VITALS — BP 120/84 | HR 88 | Ht 61.0 in | Wt 144.0 lb

## 2024-04-09 DIAGNOSIS — R030 Elevated blood-pressure reading, without diagnosis of hypertension: Secondary | ICD-10-CM

## 2024-04-09 DIAGNOSIS — R7301 Impaired fasting glucose: Secondary | ICD-10-CM

## 2024-04-09 DIAGNOSIS — E538 Deficiency of other specified B group vitamins: Secondary | ICD-10-CM

## 2024-04-09 DIAGNOSIS — E785 Hyperlipidemia, unspecified: Secondary | ICD-10-CM

## 2024-04-09 DIAGNOSIS — M130 Polyarthritis, unspecified: Secondary | ICD-10-CM

## 2024-04-09 DIAGNOSIS — D510 Vitamin B12 deficiency anemia due to intrinsic factor deficiency: Secondary | ICD-10-CM

## 2024-04-09 DIAGNOSIS — M2550 Pain in unspecified joint: Secondary | ICD-10-CM

## 2024-04-09 DIAGNOSIS — D508 Other iron deficiency anemias: Secondary | ICD-10-CM

## 2024-04-09 DIAGNOSIS — K449 Diaphragmatic hernia without obstruction or gangrene: Secondary | ICD-10-CM | POA: Insufficient documentation

## 2024-04-09 DIAGNOSIS — E663 Overweight: Secondary | ICD-10-CM

## 2024-04-09 LAB — COMPREHENSIVE METABOLIC PANEL WITH GFR
ALT: 14 U/L (ref 0–35)
AST: 17 U/L (ref 0–37)
Albumin: 4.6 g/dL (ref 3.5–5.2)
Alkaline Phosphatase: 57 U/L (ref 39–117)
BUN: 19 mg/dL (ref 6–23)
CO2: 30 meq/L (ref 19–32)
Calcium: 9.8 mg/dL (ref 8.4–10.5)
Chloride: 102 meq/L (ref 96–112)
Creatinine, Ser: 0.79 mg/dL (ref 0.40–1.20)
GFR: 80.8 mL/min (ref >60.00)
Glucose, Bld: 92 mg/dL (ref 70–99)
Potassium: 4.9 meq/L (ref 3.5–5.1)
Sodium: 139 meq/L (ref 135–145)
Total Bilirubin: 0.4 mg/dL (ref 0.2–1.2)
Total Protein: 6.9 g/dL (ref 6.0–8.3)

## 2024-04-09 LAB — CBC WITH DIFFERENTIAL/PLATELET
Basophils Absolute: 0 10*3/uL (ref 0.0–0.1)
Basophils Relative: 1 % (ref 0.0–3.0)
Eosinophils Absolute: 0.2 10*3/uL (ref 0.0–0.7)
Eosinophils Relative: 3.8 % (ref 0.0–5.0)
HCT: 39.8 % (ref 36.0–46.0)
Hemoglobin: 13.5 g/dL (ref 12.0–15.0)
Lymphocytes Relative: 28.9 % (ref 12.0–46.0)
Lymphs Abs: 1.3 10*3/uL (ref 0.7–4.0)
MCHC: 33.8 g/dL (ref 30.0–36.0)
MCV: 94 fl (ref 78.0–100.0)
Monocytes Absolute: 0.4 10*3/uL (ref 0.1–1.0)
Monocytes Relative: 8 % (ref 3.0–12.0)
Neutro Abs: 2.7 10*3/uL (ref 1.4–7.7)
Neutrophils Relative %: 58.3 % (ref 43.0–77.0)
Platelets: 330 10*3/uL (ref 150.0–400.0)
RBC: 4.24 Mil/uL (ref 3.87–5.11)
RDW: 12.9 % (ref 11.5–15.5)
WBC: 4.7 10*3/uL (ref 4.0–10.5)

## 2024-04-09 LAB — MICROALBUMIN / CREATININE URINE RATIO
Creatinine,U: 115.8 mg/dL
Microalb Creat Ratio: 6.7 mg/g (ref 0.0–30.0)
Microalb, Ur: 0.8 mg/dL (ref 0.0–1.9)

## 2024-04-09 LAB — B12 AND FOLATE PANEL
Folate: >25.2 ng/mL (ref >5.9)
Vitamin B-12: 589 pg/mL (ref 211–911)

## 2024-04-09 LAB — LIPID PANEL
Cholesterol: 203 mg/dL — ABNORMAL HIGH (ref 0–200)
HDL: 71.9 mg/dL (ref >39.00)
LDL Cholesterol: 121 mg/dL — ABNORMAL HIGH (ref 0–99)
NonHDL: 131.32
Total CHOL/HDL Ratio: 3
Triglycerides: 52 mg/dL (ref 0.0–149.0)
VLDL: 10.4 mg/dL (ref 0.0–40.0)

## 2024-04-09 LAB — IBC + FERRITIN
Ferritin: 34.1 ng/mL (ref 10.0–291.0)
Iron: 71 ug/dL (ref 42–145)
Saturation Ratios: 18 % — ABNORMAL LOW (ref 20.0–50.0)
TIBC: 394.8 ug/dL (ref 250.0–450.0)
Transferrin: 282 mg/dL (ref 212.0–360.0)

## 2024-04-09 LAB — LDL CHOLESTEROL, DIRECT: Direct LDL: 111 mg/dL

## 2024-04-09 LAB — SEDIMENTATION RATE: Sed Rate: 6 mm/h (ref 0–30)

## 2024-04-09 LAB — HEMOGLOBIN A1C: Hgb A1c MFr Bld: 5.6 % (ref 4.6–6.5)

## 2024-04-09 LAB — C-REACTIVE PROTEIN: CRP: <1 mg/dL (ref 0.5–20.0)

## 2024-04-09 MED ORDER — ALPRAZOLAM 0.5 MG PO TABS: 0.5000 mg | ORAL_TABLET | Freq: Every evening | ORAL | 0 refills | Status: DC | PRN

## 2024-04-09 MED ORDER — SEMAGLUTIDE-WEIGHT MANAGEMENT 0.25 MG/0.5ML ~~LOC~~ SOAJ: 0.2500 mg | SUBCUTANEOUS | 0 refills | Status: DC

## 2024-04-09 MED ORDER — TRAMADOL HCL 50 MG PO TABS: 50.0000 mg | ORAL_TABLET | Freq: Every evening | ORAL | 5 refills | Status: DC | PRN

## 2024-04-09 NOTE — Assessment & Plan Note (Signed)
 Resoliving with iv iron  .  EGD/colon normal Dec 2024

## 2024-04-09 NOTE — Patient Instructions (Addendum)
   I am prescribing a medication to help you lose weight called ZOXWRU.  You can read about it and obtain the copay card on their website Destiny Martin is a GLP-1 receptor agonist.  It is a medication that is taken as a weekly subcutaneous injection. It is NOT insulin.  It  causes your pancreas to increase its  own insulin secretion  And also slows down the emptying of your stomach,  So it decreases your appetite and helps you lose weight. For people with diabetes,  the decreased appetite results in lower blood sugars.   The dose for the first 4 weekly doses is0.25 mg .  You may have mild nausea on the first or second day but this should resolve.  If THE NAUSEA LASTS FOR A WEEK,  stop the medication.   As long as you are losing weight,  you can continue the dose you are on  YOU MUST REQUEST THE NEXT HIGHER DOSE IF YOU WANT IT     SEND ME A MYCHART MESSAGE WITH THE FOLLOWING INFORMATION:  1)  the dose you are currently taking  2) whether or not you have lost weight in the preceding week (has your weight plateaued?)  I recommend that you  increase the dose every  4 weeks ONLY if your weight has plateaued.    RX HAS BEEN SENT TO [facility]   Here are some local and online Sources for zepbound and [employer] (direct pay) if the coupon doesn't help bring the cost down to something affordable    [employer] CASH PAY FOR ZEPBOUND VIAL [place], [place] - [address]  [employer] [facility] (online  GLP)  [employer] [facility]:  (Online GLP )  Marykay Snipes MD in [place] also provides the injectable medication through a weight management program    2) Screening tests of autoimmune arthritis types today   3) Sleep aids:  Listen to [place] bowls (on your phone )   try using Relaxium for insomnia  (as seen on TV commercials) . It is available through [employer] and contains all natural supplements:  Melatonin 5 mg  Chamomile 25 mg Passionflower extract 75 mg GABA 100 mg Ashwaganda extract 125 mg Magnesium  citrate, glycinate, oxide (100 mg)   L tryptophan 500 mg Valerest (proprietary  ingredient ; probably valeria root extract)

## 2024-04-09 NOTE — Progress Notes (Signed)
 Subjective:  Patient ID: Destiny Martin, female    DOB: May 21, 1961  Age: 63 y.o. MRN: [id number]  CC: The primary encounter diagnosis was Impaired fasting glucose. Diagnoses of Hyperlipidemia, unspecified hyperlipidemia type, White coat syndrome without diagnosis of hypertension, Other iron  deficiency anemia, Hiatal hernia, Polyarthritis, B12 deficiency, Overweight (BMI 25.0-29.9), Pernicious anemia, and Polyarthralgia were also pertinent to this visit.   HPI Destiny Martin presents for  Chief Complaint  Patient presents with   Medical Management of Chronic Issues    6 month follow up    1) POLYARTHRITIS: has not had an evaluation by Rheum yet , no diagnosis made .  Has chonic pain in both knees,  Fingers  and shoulders   2) ADHD : managed by Deborra Falter with adderal and wellbutrin   3) Overweight: unable to lose weight despite following a careful diet and exercising 4 to 5 days per week.   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  Last tramadol  refill Nov 20 for  #30/month.   5) wrist pain  bilaterally, received steroid injection in first [facility] base (snuff box) by [facility]  approx 2 months ago ,  but did not rest the joint so the relief was minimal and transient.  Symptoms consistent with Tendonitis.  Wearing the brace   Outpatient Medications Prior to Visit  Medication Sig Dispense Refill   amphetamine-dextroamphetamine (ADDERALL XR) 30 MG 24 hr capsule Take 30 mg by mouth.  0   amphetamine-dextroamphetamine (ADDERALL) 30 MG tablet Take 1 tablet by mouth 2 (two) times daily.  0   buPROPion (WELLBUTRIN XL) 150 MG 24 hr tablet Take 150 mg by mouth daily.     butalbital -acetaminophen -caffeine  (FIORICET) 50-325-40 MG tablet TAKE 1 TABLET BY MOUTH EVERY 6 HOURS AS NEEDED FOR HEADACHE. 48 tablet 2   Calcium-Vitamin D -Vitamin K (VIACTIV CALCIUM PLUS D) 650-12.5-40 MG-MCG-MCG CHEW Chew by mouth.     celecoxib  (CELEBREX ) 200 MG capsule Take 200 mg by mouth 2  (two) times daily.     Cholecalciferol (D3 2000 PO) Take 2,000 Units by mouth.     conjugated estrogens  (PREMARIN ) vaginal cream Place 1 Applicatorful vaginally daily. 42.5 g 12   cyanocobalamin  (VITAMIN B12) 1000 MCG/ML injection INJECT 1 ML (1,000 MCG) INTRAMUSCULARLY EVERY 30 DAYS 3 mL 3   esomeprazole  (NEXIUM ) 40 MG capsule TAKE 1 CAPSULE (40 MG TOTAL) BY MOUTH IN THE MORNING 90 capsule 3   FLUoxetine (PROZAC) 40 MG capsule Take 40 mg by mouth daily.     Iron , Ferrous Sulfate , 325 (65 Fe) MG TABS Take 325 mg by mouth daily. 90 tablet 1   Multiple Vitamin (MULTIVITAMIN ADULT PO) Take by mouth.     ondansetron  (ZOFRAN -ODT) 4 MG disintegrating tablet TAKE 1 TABLET BY MOUTH EVERY 8 HOURS AS NEEDED FOR NAUSEA AND VOMITING 18 tablet 1   tiZANidine  (ZANAFLEX ) 4 MG capsule TAKE 1 CAPSULE BY MOUTH 3 TIMES DAILY AS NEEDED FOR MUSCLE SPASMS. 270 capsule 0   traZODone  (DESYREL ) 50 MG tablet TAKE 0.5-1 TABLETS (25-50 MG TOTAL) BY MOUTH AT BEDTIME AS NEEDED FOR SLEEP. (NEED APPT) 270 tablet 1   TURMERIC PO Take 1,000 mg by mouth.     VAGIFEM  10 MCG TABS vaginal tablet INSERT ONE TABLET VAGINALLY NIGHTLY FOR 2 WEEKS, THEN WEEKLY THEREAFTER 24 tablet 3   ALPRAZolam  (XANAX ) 0.5 MG tablet TAKE 1 TABLET (0.5 MG TOTAL) BY MOUTH AT BEDTIME AS NEEDED. 20 tablet 2   amLODipine  (NORVASC ) 2.5 MG tablet  TAKE 1 TABLET BY MOUTH EVERY DAY 90 tablet 1   traMADol  (ULTRAM ) 50 MG tablet TAKE 1 TABLET BY MOUTH EVERY 8 HOURS AS NEEDED 30 tablet 2   No facility-administered medications prior to visit.    Review of Systems;  Patient denies headache, fevers, malaise, unintentional weight loss, skin rash, eye pain, sinus congestion and sinus pain, sore throat, dysphagia,  hemoptysis , cough, dyspnea, wheezing, chest pain, palpitations, orthopnea, edema, abdominal pain, nausea, melena, diarrhea, constipation, flank pain, dysuria, hematuria, urinary  Frequency, nocturia, numbness, tingling, seizures,  Focal weakness, Loss of  consciousness,  Tremor, insomnia, depression, anxiety, and suicidal ideation.      Objective:  BP 120/84   Pulse 88   Ht 5\' 1"  (1.549 m)   Wt 144 lb (65.3 kg)   LMP 01/15/2018 (Approximate)   SpO2 97%   BMI 27.21 kg/m   BP Readings from Last 3 Encounters:  04/09/24 120/84  12/31/23 (!) 147/93  11/14/23 110/75    Wt Readings from Last 3 Encounters:  04/09/24 144 lb (65.3 kg)  12/31/23 144 lb (65.3 kg)  11/14/23 145 lb 3.2 oz (65.9 kg)    Physical Exam Vitals reviewed.  Constitutional:      General: She is not in acute distress.    Appearance: Normal appearance. She is normal weight. She is not ill-appearing, toxic-appearing or diaphoretic.  HENT:     Head: Normocephalic.  Eyes:     General: No scleral icterus.       Right eye: No discharge.        Left eye: No discharge.     Conjunctiva/sclera: Conjunctivae normal.  Cardiovascular:     Rate and Rhythm: Normal rate and regular rhythm.     Heart sounds: Normal heart sounds.  Pulmonary:     Effort: Pulmonary effort is normal. No respiratory distress.     Breath sounds: Normal breath sounds.  Musculoskeletal:        General: Normal range of motion.  Skin:    General: Skin is warm and dry.  Neurological:     General: No focal deficit present.     Mental Status: She is alert and oriented to person, place, and time. Mental status is at baseline.  Psychiatric:        Mood and Affect: Mood normal.        Behavior: Behavior normal.        Thought Content: Thought content normal.        Judgment: Judgment normal.    Lab Results  Component Value Date   HGBA1C 5.6 04/09/2024   HGBA1C 6.3 08/16/2023   HGBA1C 5.5 04/06/2022    Lab Results  Component Value Date   CREATININE 0.79 04/09/2024   CREATININE 0.68 08/16/2023   CREATININE 0.70 10/10/2022    Lab Results  Component Value Date   WBC 4.7 04/09/2024   HGB 13.5 04/09/2024   HCT 39.8 04/09/2024   PLT 330.0 04/09/2024   GLUCOSE 92 04/09/2024   CHOL 203  (H) 04/09/2024   TRIG 52.0 04/09/2024   HDL 71.90 04/09/2024   LDLDIRECT 111.0 04/09/2024   LDLCALC 121 (H) 04/09/2024   ALT 14 04/09/2024   AST 17 04/09/2024   NA 139 04/09/2024   K 4.9 04/09/2024   CL 102 04/09/2024   CREATININE 0.79 04/09/2024   BUN 19 04/09/2024   CO2 30 04/09/2024   TSH 1.64 08/16/2023   HGBA1C 5.6 04/09/2024   MICROALBUR 0.8 04/09/2024    No results found.  Assessment & Plan:  .Impaired fasting glucose -     Comprehensive metabolic panel with GFR -     Hemoglobin A1c  Hyperlipidemia, unspecified hyperlipidemia type -     Lipid panel -     LDL cholesterol, direct  White coat syndrome without diagnosis of hypertension -     Comprehensive metabolic panel with GFR -     Microalbumin / creatinine urine ratio  Other iron  deficiency anemia Assessment & Plan: Resoliving with iv iron  .  EGD/colon normal Dec 2024   Orders: -     IBC + Ferritin -     CBC with Differential/Platelet  Hiatal hernia  Polyarthritis -     Sedimentation rate -     C-reactive protein -     Rheumatoid factor -     Cyclic citrul peptide antibody, IgG -     HLA-B27 antigen -     ANA  B12 deficiency -     B12 and Folate Panel  Overweight (BMI 25.0-29.9) Assessment & Plan: Patient has been unable to lose or maintain a healthy weight despite good effort. Encouraged to increase exercise involvement to include more intense aerobic activity for 30 minutes 5 days per week.  Screened for contraindications to use of GLP 1 agonists for appetite suppression and she has none.  The risks and benefits of pharmacotherapy discussed and she is requesting a trial of therapy .  rx written for Wegov[facility]    Pernicious anemia Assessment & Plan:  She continues to supplement  parenterally ; B12 level is normal   Lab Results  Component Value Date   VITAMINB12 589 04/09/2024      Lab Results  Component Value Date   WBC 4.7 04/09/2024   HGB 13.5 04/09/2024   HCT 39.8 04/09/2024   MCV 94.0  04/09/2024   PLT 330.0 04/09/2024      Polyarthralgia Assessment & Plan: There is no synovitis of any joints that are currently cited as painful by patient.  Serologies for inflammatory and autoimmune forms of arthritis are either pending or negative.  Trial of NSAID and glucosamine recommended.     Other orders -     Semaglutide -Weight Management; Inject 0.25 mg into the skin once a week for 28 days.  Dispense: 2 mL; Refill: 0 -     ALPRAZolam ; Take 1 tablet (0.5 mg total) by mouth at bedtime as needed.  Dispense: 20 tablet; Refill: 0 -     traMADol  HCl; Take 1 tablet (50 mg total) by mouth at bedtime as needed.  Dispense: 30 tablet; Refill: 5     I spent 34 minutes on the day of this face to face encounter reviewing patient's  most recent visit with orthopedics,  prior relevant surgical and non surgical procedures, recent  labs and imaging studies, counseling on weight management,  reviewing the assessment and plan with patient, and post visit ordering and reviewing of  diagnostics and therapeutics with patient  .   Follow-up: Return in about 6 months (around 10/10/2024) for weight management , hypertension.   Thersia Flax, MD

## 2024-04-10 ENCOUNTER — Encounter: Payer: Self-pay | Admitting: Internal Medicine

## 2024-04-11 ENCOUNTER — Encounter: Payer: Self-pay | Admitting: Internal Medicine

## 2024-04-11 DIAGNOSIS — M2550 Pain in unspecified joint: Secondary | ICD-10-CM | POA: Insufficient documentation

## 2024-04-11 LAB — HLA-B27 ANTIGEN: HLA-B27 Antigen: NEGATIVE

## 2024-04-11 LAB — RHEUMATOID FACTOR: Rheumatoid fact SerPl-aCnc: <10 [IU]/mL (ref <14)

## 2024-04-11 LAB — CYCLIC CITRUL PEPTIDE ANTIBODY, IGG: Cyclic Citrullin Peptide Ab: <16 U

## 2024-04-11 NOTE — Assessment & Plan Note (Signed)
 She continues to supplement  parenterally ; B12 level is normal   Lab Results  Component Value Date   VITAMINB12 589 04/09/2024      Lab Results  Component Value Date   WBC 4.7 04/09/2024   HGB 13.5 04/09/2024   HCT 39.8 04/09/2024   MCV 94.0 04/09/2024   PLT 330.0 04/09/2024

## 2024-04-11 NOTE — Assessment & Plan Note (Signed)
 Patient has been unable to lose or maintain a healthy weight despite good effort. Encouraged to increase exercise involvement to include more intense aerobic activity for 30 minutes 5 days per week.  Screened for contraindications to use of GLP 1 agonists for appetite suppression and she has none.  The risks and benefits of pharmacotherapy discussed and she is requesting a trial of therapy .  rx written for Wegovy 

## 2024-04-11 NOTE — Assessment & Plan Note (Signed)
There is no synovitis of any joints that are currently cited as painful by patient.  Serologies for inflammatory and autoimmune forms of arthritis are either pending or negative.  Trial of NSAID and glucosamine recommended.  

## 2024-04-12 LAB — ANA: Anti Nuclear Antibody (ANA): NEGATIVE

## 2024-04-16 MED ORDER — PEN NEEDLES 31G X 6 MM MISC: 0 refills | Status: DC

## 2024-04-16 MED ORDER — SAXENDA 18 MG/3ML ~~LOC~~ SOPN: 0.6000 mg | PEN_INJECTOR | Freq: Every day | SUBCUTANEOUS | 0 refills | Status: DC

## 2024-04-23 ENCOUNTER — Telehealth: Payer: Self-pay | Admitting: Oncology

## 2024-04-23 NOTE — Telephone Encounter (Signed)
 Pt stated she just had labs on 5/7 and wants to know if she has to have labs again on 5/28. Please advise

## 2024-04-30 ENCOUNTER — Other Ambulatory Visit: Payer: 59

## 2024-05-01 ENCOUNTER — Encounter: Payer: Self-pay | Admitting: Oncology

## 2024-05-01 ENCOUNTER — Inpatient Hospital Stay: Payer: 59

## 2024-05-01 ENCOUNTER — Inpatient Hospital Stay: Payer: 59 | Attending: Oncology | Admitting: Oncology

## 2024-05-01 VITALS — BP 140/76 | HR 89 | Temp 98.5°F | Resp 16 | Ht 61.0 in | Wt 145.0 lb

## 2024-05-01 DIAGNOSIS — D509 Iron deficiency anemia, unspecified: Secondary | ICD-10-CM | POA: Diagnosis not present

## 2024-05-01 DIAGNOSIS — I10 Essential (primary) hypertension: Secondary | ICD-10-CM | POA: Diagnosis not present

## 2024-05-01 DIAGNOSIS — Z8042 Family history of malignant neoplasm of prostate: Secondary | ICD-10-CM | POA: Insufficient documentation

## 2024-05-01 NOTE — Progress Notes (Signed)
 [facility]  [phone] [phone]  ID: Toi Foster OB: 1961/08/11  MR#: [id number]  AOZ#:[id number]  Patient Care Team: Thersia Flax, MD as PCP - General (Internal Medicine) Thersia Flax, MD (Internal Medicine) Shellie Dials, MD as Consulting Physician (Oncology)  CHIEF COMPLAINT: Iron  deficiency anemia.  INTERVAL HISTORY: Patient returns to clinic today for repeat laboratory, further evaluation, consideration of additional IV Venofer .  She has tendinitis in her left wrist, but otherwise feels well.  She does not complain of any weakness or fatigue. She has no neurologic complaints.  She denies any recent fevers or illnesses.  She has a good appetite and denies weight loss.  She has no chest pain, shortness of breath, cough, or hemoptysis.  She has no nausea, vomiting, constipation, or diarrhea.  She has no melena or hematochezia.  She has no urinary complaints.  Patient offers no further specific complaints today.  REVIEW OF SYSTEMS:   Review of Systems  Constitutional: Negative.  Negative for fever, malaise/fatigue and weight loss.  Respiratory: Negative.  Negative for cough, hemoptysis and shortness of breath.   Cardiovascular:  Negative for chest pain and leg swelling.  Gastrointestinal: Negative.  Negative for abdominal pain, blood in stool and melena.  Genitourinary: Negative.  Negative for hematuria.  Musculoskeletal:  Positive for joint pain. Negative for back pain.  Skin: Negative.   Neurological: Negative.  Negative for dizziness, focal weakness, weakness and headaches.  Psychiatric/Behavioral: Negative.  The patient is not nervous/anxious.     As per HPI. Otherwise, a complete review of systems is negative.  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   GERD  (gastroesophageal reflux disease)    Hypertension    Osteoarthritis of carpometacarpal (CMC) joint of thumb 02/20/2019   Pain in right hand 02/20/2019    PAST SURGICAL HISTORY: Past Surgical History:  Procedure Laterality Date   BIOPSY  11/14/2023   Procedure: BIOPSY;  Surgeon: Luke Salaam, MD;  Location: [facility] ENDOSCOPY;  Service: Gastroenterology;;   COLONOSCOPY WITH PROPOFOL  N/A 03/27/2017   Procedure: COLONOSCOPY WITH PROPOFOL ;  Surgeon: Marshall Skeeter, MD;  Location: [facility] ENDOSCOPY;  Service: Endoscopy;  Laterality: N/A;   COLONOSCOPY WITH PROPOFOL  N/A 11/14/2023   Procedure: COLONOSCOPY WITH PROPOFOL ;  Surgeon: Luke Salaam, MD;  Location: [facility] ENDOSCOPY;  Service: Gastroenterology;  Laterality: N/A;   ESOPHAGOGASTRODUODENOSCOPY (EGD) WITH PROPOFOL  N/A 03/27/2017   Procedure: ESOPHAGOGASTRODUODENOSCOPY (EGD) WITH PROPOFOL ;  Surgeon: Marshall Skeeter, MD;  Location: [facility] ENDOSCOPY;  Service: Endoscopy;  Laterality: N/A;   ESOPHAGOGASTRODUODENOSCOPY (EGD) WITH PROPOFOL  N/A 03/17/2020   Procedure: ESOPHAGOGASTRODUODENOSCOPY (EGD) WITH PROPOFOL ;  Surgeon: Marshall Skeeter, MD;  Location: [facility] ENDOSCOPY;  Service: Endoscopy;  Laterality: N/A;  also for excision anal tag in endo   ESOPHAGOGASTRODUODENOSCOPY (EGD) WITH PROPOFOL  N/A 11/14/2023   Procedure: ESOPHAGOGASTRODUODENOSCOPY (EGD) WITH PROPOFOL ;  Surgeon: Luke Salaam, MD;  Location: [facility] ENDOSCOPY;  Service: Gastroenterology;  Laterality: N/A;   ESOPHAGOGASTRODUODENOSCOPY ENDOSCOPY     2008    FOOT SURGERY Right 2007   JOINT REPLACEMENT  11-18-16   surgery on vertibra   NECK SURGERY  2016   TONSILLECTOMY  1974    FAMILY HISTORY: Family History  Problem Relation Age of Onset   Hypertension Father    Heart disease Father    Prostate cancer Father  Cancer Father    Hearing loss Father    Stroke Father    Heart disease Mother    Hypertension Mother    Arthritis Paternal Grandmother    Breast cancer Neg Hx      ADVANCED DIRECTIVES (Y/N):  N  HEALTH MAINTENANCE: Social History   Tobacco Use   Smoking status: Never   Smokeless tobacco: Never  Vaping Use   Vaping status: Never Used  Substance Use Topics   Alcohol use: Yes    Comment: I may have a drink but not often.   Drug use: No     Colonoscopy:  PAP:  Bone density:  Lipid panel:  Allergies  Allergen Reactions   Erythromycin Nausea Only   Lisinopril  Swelling    Angio-edema   Penicillins    Sulfa Antibiotics Rash    Current Outpatient Medications  Medication Sig Dispense Refill   ALPRAZolam  (XANAX ) 0.5 MG tablet Take 1 tablet (0.5 mg total) by mouth at bedtime as needed. 20 tablet 0   amphetamine-dextroamphetamine (ADDERALL XR) 30 MG 24 hr capsule Take 30 mg by mouth.  0   amphetamine-dextroamphetamine (ADDERALL) 30 MG tablet Take 1 tablet by mouth 2 (two) times daily.  0   buPROPion (WELLBUTRIN XL) 150 MG 24 hr tablet Take 150 mg by mouth daily.     butalbital -acetaminophen -caffeine  (FIORICET) 50-325-40 MG tablet TAKE 1 TABLET BY MOUTH EVERY 6 HOURS AS NEEDED FOR HEADACHE. 48 tablet 2   Calcium-Vitamin D -Vitamin K (VIACTIV CALCIUM PLUS D) 650-12.5-40 MG-MCG-MCG CHEW Chew by mouth.     celecoxib  (CELEBREX ) 200 MG capsule Take 200 mg by mouth 2 (two) times daily.     Cholecalciferol (D3 2000 PO) Take 2,000 Units by mouth.     conjugated estrogens  (PREMARIN ) vaginal cream Place 1 Applicatorful vaginally daily. 42.5 g 12   cyanocobalamin  (VITAMIN B12) 1000 MCG/ML injection INJECT 1 ML (1,000 MCG) INTRAMUSCULARLY EVERY 30 DAYS 3 mL 3   esomeprazole  (NEXIUM ) 40 MG capsule TAKE 1 CAPSULE (40 MG TOTAL) BY MOUTH IN THE MORNING 90 capsule 3   FLUoxetine (PROZAC) 40 MG capsule Take 40 mg by mouth daily.     Insulin Pen Needle (PEN NEEDLES) 31G X 6 MM MISC For use with victoza /saxenda  100 each 0   Iron , Ferrous Sulfate , 325 (65 Fe) MG TABS Take 325 mg by mouth daily. 90 tablet 1   Liraglutide  -Weight Management (SAXENDA ) 18 MG/3ML  SOPN Inject 0.6 mg into the skin daily. Increase dose weekly as follows: Week 2: 1.2 mg daily ; Week 3: 1.8 mg daily; Week 4: 2.4 mg daily 9 mL 0   Multiple Vitamin (MULTIVITAMIN ADULT PO) Take by mouth.     ondansetron  (ZOFRAN -ODT) 4 MG disintegrating tablet TAKE 1 TABLET BY MOUTH EVERY 8 HOURS AS NEEDED FOR NAUSEA AND VOMITING 18 tablet 1   tiZANidine  (ZANAFLEX ) 4 MG capsule TAKE 1 CAPSULE BY MOUTH 3 TIMES DAILY AS NEEDED FOR MUSCLE SPASMS. 270 capsule 0   traMADol  (ULTRAM ) 50 MG tablet Take 1 tablet (50 mg total) by mouth at bedtime as needed. 30 tablet 5   traZODone  (DESYREL ) 50 MG tablet TAKE 0.5-1 TABLETS (25-50 MG TOTAL) BY MOUTH AT BEDTIME AS NEEDED FOR SLEEP. (NEED APPT) 270 tablet 1   TURMERIC PO Take 1,000 mg by mouth.     VAGIFEM  10 MCG TABS vaginal tablet INSERT ONE TABLET VAGINALLY NIGHTLY FOR 2 WEEKS, THEN WEEKLY THEREAFTER 24 tablet 3   No current facility-administered medications for this visit.  OBJECTIVE: Vitals:   05/01/24 1418  BP: (!) 140/76  Pulse: 89  Resp: 16  Temp: 98.5 F (36.9 C)  SpO2: 99%     Body mass index is 27.4 kg/m.    ECOG FS:0 - Asymptomatic  General: Well-developed, well-nourished, no acute distress. Eyes: Pink conjunctiva, anicteric sclera. HEENT: Normocephalic, moist mucous membranes. Lungs: No audible wheezing or coughing. Heart: Regular rate and rhythm. Abdomen: Soft, nontender, no obvious distention. Musculoskeletal: No edema, cyanosis, or clubbing. Neuro: Alert, answering all questions appropriately. Cranial nerves grossly intact. Skin: No rashes or petechiae noted. Psych: Normal affect.  LAB RESULTS:  Lab Results  Component Value Date   NA 139 04/09/2024   K 4.9 04/09/2024   CL 102 04/09/2024   CO2 30 04/09/2024   GLUCOSE 92 04/09/2024   BUN 19 04/09/2024   CREATININE 0.79 04/09/2024   CALCIUM 9.8 04/09/2024   PROT 6.9 04/09/2024   ALBUMIN 4.6 04/09/2024   AST 17 04/09/2024   ALT 14 04/09/2024   ALKPHOS 57 04/09/2024    BILITOT 0.4 04/09/2024    Lab Results  Component Value Date   WBC 4.7 04/09/2024   NEUTROABS 2.7 04/09/2024   HGB 13.5 04/09/2024   HCT 39.8 04/09/2024   MCV 94.0 04/09/2024   PLT 330.0 04/09/2024   Lab Results  Component Value Date   IRON  71 04/09/2024   TIBC 394.8 04/09/2024   IRONPCTSAT 18.0 (L) 04/09/2024   Lab Results  Component Value Date   FERRITIN 34.1 04/09/2024     STUDIES: No results found.  ASSESSMENT: Iron  deficiency anemia.  PLAN:    Iron  deficiency anemia: Patient's hemoglobin and iron  stores continue to be within normal limits other than a mildly decreased iron  saturation.  She recently underwent colonoscopy and EGD on November 14, 2023 that revealed mild gastritis, but no other significant pathology.  She does not require additional IV Venofer  today.  She last received treatment on September 21, 2023.  Return to clinic in 6 months with repeat laboratory work, further evaluation, and consideration of additional treatment if needed.  If patient's laboratory work remains within normal limits at that time, she likely can be discharged from clinic.   Thrombocytosis: Resolved. Hypertension: Chronic and unchanged.  Continue monitoring and treatment per primary care.    Patient expressed understanding and was in agreement with this plan. She also understands that She can call clinic at any time with any questions, concerns, or complaints.    Shellie Dials, MD   05/01/2024 2:44 PM

## 2024-08-13 ENCOUNTER — Other Ambulatory Visit: Payer: Self-pay | Admitting: Internal Medicine

## 2024-08-13 DIAGNOSIS — Z1231 Encounter for screening mammogram for malignant neoplasm of breast: Secondary | ICD-10-CM

## 2024-08-19 ENCOUNTER — Encounter: Payer: Self-pay | Admitting: Internal Medicine

## 2024-08-19 DIAGNOSIS — D508 Other iron deficiency anemias: Secondary | ICD-10-CM

## 2024-08-20 MED ORDER — CYANOCOBALAMIN 1000 MCG/ML IJ SOLN: 1000.0000 ug | INTRAMUSCULAR | 3 refills | Status: DC

## 2024-08-22 ENCOUNTER — Other Ambulatory Visit (INDEPENDENT_AMBULATORY_CARE_PROVIDER_SITE_OTHER)

## 2024-08-22 DIAGNOSIS — D508 Other iron deficiency anemias: Secondary | ICD-10-CM | POA: Diagnosis not present

## 2024-08-22 LAB — CBC WITH DIFFERENTIAL/PLATELET
Basophils Absolute: 0 K/uL (ref 0.0–0.1)
Basophils Relative: 0.4 % (ref 0.0–3.0)
Eosinophils Absolute: 0.2 K/uL (ref 0.0–0.7)
Eosinophils Relative: 2.5 % (ref 0.0–5.0)
HCT: 40.1 % (ref 36.0–46.0)
Hemoglobin: 13.8 g/dL (ref 12.0–15.0)
Lymphocytes Relative: 14.7 % (ref 12.0–46.0)
Lymphs Abs: 1.5 K/uL (ref 0.7–4.0)
MCHC: 34.4 g/dL (ref 30.0–36.0)
MCV: 91.2 fl (ref 78.0–100.0)
Monocytes Absolute: 0.8 K/uL (ref 0.1–1.0)
Monocytes Relative: 7.5 % (ref 3.0–12.0)
Neutro Abs: 7.5 K/uL (ref 1.4–7.7)
Neutrophils Relative %: 74.9 % (ref 43.0–77.0)
Platelets: 334 K/uL (ref 150.0–400.0)
RBC: 4.4 Mil/uL (ref 3.87–5.11)
RDW: 12.9 % (ref 11.5–15.5)
WBC: 10.1 K/uL (ref 4.0–10.5)

## 2024-08-22 LAB — IBC + FERRITIN
Ferritin: 101.8 ng/mL (ref 10.0–291.0)
Iron: 23 ug/dL — ABNORMAL LOW (ref 42–145)
Saturation Ratios: 6.8 % — ABNORMAL LOW (ref 20.0–50.0)
TIBC: 337.4 ug/dL (ref 250.0–450.0)
Transferrin: 241 mg/dL (ref 212.0–360.0)

## 2024-08-23 ENCOUNTER — Ambulatory Visit: Payer: Self-pay | Admitting: Internal Medicine

## 2024-08-26 ENCOUNTER — Telehealth: Payer: Self-pay | Admitting: *Deleted

## 2024-08-26 NOTE — Telephone Encounter (Signed)
 Patient called and wanted know the labs when we get the labs she wants to have a call and she is hoping that she can get an IV iron  based on the labs as soon as possible for her

## 2024-08-28 ENCOUNTER — Inpatient Hospital Stay: Attending: Oncology

## 2024-08-28 VITALS — BP 115/71 | HR 76 | Temp 97.4°F | Resp 19

## 2024-08-28 DIAGNOSIS — D509 Iron deficiency anemia, unspecified: Secondary | ICD-10-CM | POA: Diagnosis present

## 2024-08-28 MED ORDER — IRON SUCROSE 20 MG/ML IV SOLN
200.0000 mg | Freq: Once | INTRAVENOUS | Status: AC
  Administered 2024-08-28: 200 mg via INTRAVENOUS
  Filled 2024-08-28: qty 10

## 2024-08-28 MED ORDER — SODIUM CHLORIDE 0.9% FLUSH
10.0000 mL | Freq: Once | INTRAVENOUS | Status: AC | PRN
  Administered 2024-08-28: 10 mL
  Filled 2024-08-28: qty 10

## 2024-09-01 ENCOUNTER — Inpatient Hospital Stay

## 2024-09-04 ENCOUNTER — Ambulatory Visit: Admitting: Nurse Practitioner

## 2024-09-04 ENCOUNTER — Encounter: Payer: Self-pay | Admitting: Nurse Practitioner

## 2024-09-04 ENCOUNTER — Inpatient Hospital Stay: Attending: Oncology

## 2024-09-04 VITALS — BP 154/74 | HR 113 | Temp 97.9°F | Ht 61.0 in | Wt 122.8 lb

## 2024-09-04 VITALS — BP 135/88 | HR 92 | Temp 97.0°F | Resp 18

## 2024-09-04 DIAGNOSIS — E559 Vitamin D deficiency, unspecified: Secondary | ICD-10-CM | POA: Diagnosis not present

## 2024-09-04 DIAGNOSIS — R5383 Other fatigue: Secondary | ICD-10-CM | POA: Diagnosis not present

## 2024-09-04 DIAGNOSIS — E538 Deficiency of other specified B group vitamins: Secondary | ICD-10-CM

## 2024-09-04 DIAGNOSIS — D509 Iron deficiency anemia, unspecified: Secondary | ICD-10-CM | POA: Insufficient documentation

## 2024-09-04 LAB — COMPREHENSIVE METABOLIC PANEL WITH GFR
ALT: 15 U/L (ref 0–35)
AST: 18 U/L (ref 0–37)
Albumin: 4.2 g/dL (ref 3.5–5.2)
Alkaline Phosphatase: 53 U/L (ref 39–117)
BUN: 6 mg/dL (ref 6–23)
CO2: 32 meq/L (ref 19–32)
Calcium: 9.6 mg/dL (ref 8.4–10.5)
Chloride: 100 meq/L (ref 96–112)
Creatinine, Ser: 0.79 mg/dL (ref 0.40–1.20)
GFR: 80.57 mL/min (ref >60.00)
Glucose, Bld: 87 mg/dL (ref 70–99)
Potassium: 3.6 meq/L (ref 3.5–5.1)
Sodium: 140 meq/L (ref 135–145)
Total Bilirubin: 0.4 mg/dL (ref 0.2–1.2)
Total Protein: 7.1 g/dL (ref 6.0–8.3)

## 2024-09-04 LAB — TSH: TSH: 0.76 u[IU]/mL (ref 0.35–5.50)

## 2024-09-04 LAB — HEMOGLOBIN A1C: Hgb A1c MFr Bld: 5.5 % (ref 4.6–6.5)

## 2024-09-04 MED ORDER — IRON SUCROSE 20 MG/ML IV SOLN
200.0000 mg | Freq: Once | INTRAVENOUS | Status: AC
  Administered 2024-09-04: 200 mg via INTRAVENOUS
  Filled 2024-09-04: qty 10

## 2024-09-04 NOTE — Progress Notes (Signed)
 Established Patient Office Visit  Subjective:  Patient ID: Destiny Martin, female    DOB: 1960-12-05  Age: 63 y.o. MRN: [id number]  CC:  Chief Complaint  Patient presents with   Acute Visit    More tired than normal & worried about Iron  and recent labs   Discussed the use of a AI scribe software for clinical note transcription with the patient, who gave verbal consent to proceed.  HPI Destiny Martin is a 63 year old female with iron  deficiency who presents with fatigue and low iron  levels.  She experiences persistent fatigue and low iron  levels. A year ago, iron  infusions improved her symptoms when her iron  level was 26. Recent lab work shows her iron  level has dropped to 23. She takes over-the-counter iron  supplements, but a specialist indicated these are ineffective for her condition. She is scheduled for two iron  infusions but is concerned this may not be sufficient, as more than two were previously recommended.  Recently, she contracted a stomach virus, likely from her grandchild, which exacerbated her fatigue and caused dizziness, nausea, and violent vomiting for about 12 hours. She feels better from the virus but still lacks energy.  She experiences joint pain, particularly in her knees, and general achiness similar to flu symptoms. She has a history of joint issues, including a knee injury and nodules on her fingers. She has been dieting and has lost weight, hoping to alleviate some joint pain.   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   GERD (gastroesophageal reflux disease)    Hypertension    Osteoarthritis of carpometacarpal (CMC) joint of thumb 02/20/2019   Pain in right hand 02/20/2019    Past Surgical History:  Procedure Laterality Date   BIOPSY  11/14/2023   Procedure: BIOPSY;  Surgeon: Therisa Bi, MD;  Location: [facility] ENDOSCOPY;   Service: Gastroenterology;;   COLONOSCOPY WITH PROPOFOL  N/A 03/27/2017   Procedure: COLONOSCOPY WITH PROPOFOL ;  Surgeon: Reyes LELON Cota, MD;  Location: [facility] ENDOSCOPY;  Service: Endoscopy;  Laterality: N/A;   COLONOSCOPY WITH PROPOFOL  N/A 11/14/2023   Procedure: COLONOSCOPY WITH PROPOFOL ;  Surgeon: Therisa Bi, MD;  Location: [facility] ENDOSCOPY;  Service: Gastroenterology;  Laterality: N/A;   ESOPHAGOGASTRODUODENOSCOPY (EGD) WITH PROPOFOL  N/A 03/27/2017   Procedure: ESOPHAGOGASTRODUODENOSCOPY (EGD) WITH PROPOFOL ;  Surgeon: Reyes LELON Cota, MD;  Location: [facility] ENDOSCOPY;  Service: Endoscopy;  Laterality: N/A;   ESOPHAGOGASTRODUODENOSCOPY (EGD) WITH PROPOFOL  N/A 03/17/2020   Procedure: ESOPHAGOGASTRODUODENOSCOPY (EGD) WITH PROPOFOL ;  Surgeon: Cota Reyes LELON, MD;  Location: [facility] ENDOSCOPY;  Service: Endoscopy;  Laterality: N/A;  also for excision anal tag in endo   ESOPHAGOGASTRODUODENOSCOPY (EGD) WITH PROPOFOL  N/A 11/14/2023   Procedure: ESOPHAGOGASTRODUODENOSCOPY (EGD) WITH PROPOFOL ;  Surgeon: Therisa Bi, MD;  Location: [facility] ENDOSCOPY;  Service: Gastroenterology;  Laterality: N/A;   ESOPHAGOGASTRODUODENOSCOPY ENDOSCOPY     2008    FOOT SURGERY Right 2007   JOINT REPLACEMENT  11-18-16   surgery on vertibra   NECK SURGERY  2016   TONSILLECTOMY  1974    Family History  Problem Relation Age of Onset   Hypertension Father    Heart disease Father    Prostate cancer Father    Cancer Father    Hearing loss Father    Stroke Father    Heart disease Mother    Hypertension Mother    Arthritis  Paternal Grandmother    Breast cancer Neg Hx     Social History   Socioeconomic History   Marital status: Married    Spouse name: Not on file   Number of children: Not on file   Years of education: Not on file   Highest education level: Not on file  Occupational History   Not on file  Tobacco Use   Smoking status: Never   Smokeless tobacco: Never  Vaping Use   Vaping status: Never Used   Substance and Sexual Activity   Alcohol use: Yes    Comment: I may have a drink but not often.   Drug use: No   Sexual activity: Yes    Birth control/protection: None  Other Topics Concern   Not on file  Social History Narrative   Not on file   Social Drivers of Health   Financial Resource Strain: Not on file  Food Insecurity: No Food Insecurity (08/29/2023)   Hunger Vital Sign    Worried About Running Out of Food in the Last Year: Never true    Ran Out of Food in the Last Year: Never true  Transportation Needs: No Transportation Needs (08/29/2023)   PRAPARE - Administrator, Civil Service (Medical): No    Lack of Transportation (Non-Medical): No  Physical Activity: Not on file  Stress: Not on file  Social Connections: Not on file  Intimate Partner Violence: Not At Risk (08/29/2023)   Humiliation, Afraid, Rape, and Kick questionnaire    Fear of Current or Ex-Partner: No    Emotionally Abused: No    Physically Abused: No    Sexually Abused: No     Outpatient Medications Prior to Visit  Medication Sig Dispense Refill   amphetamine-dextroamphetamine (ADDERALL XR) 30 MG 24 hr capsule Take 30 mg by mouth.  0   amphetamine-dextroamphetamine (ADDERALL) 30 MG tablet Take 1 tablet by mouth 2 (two) times daily.  0   buPROPion (WELLBUTRIN XL) 150 MG 24 hr tablet Take 150 mg by mouth daily.     butalbital -acetaminophen -caffeine  (FIORICET) 50-325-40 MG tablet TAKE 1 TABLET BY MOUTH EVERY 6 HOURS AS NEEDED FOR HEADACHE. 48 tablet 2   Calcium-Vitamin D -Vitamin K (VIACTIV CALCIUM PLUS D) 650-12.5-40 MG-MCG-MCG CHEW Chew by mouth.     celecoxib  (CELEBREX ) 200 MG capsule Take 200 mg by mouth 2 (two) times daily.     Cholecalciferol (D3 2000 PO) Take 2,000 Units by mouth.     conjugated estrogens  (PREMARIN ) vaginal cream Place 1 Applicatorful vaginally daily. 42.5 g 12   cyanocobalamin  (VITAMIN B12) 1000 MCG/ML injection Inject 1 mL (1,000 mcg total) into the muscle every 30  (thirty) days. 3 mL 3   esomeprazole  (NEXIUM ) 40 MG capsule TAKE 1 CAPSULE (40 MG TOTAL) BY MOUTH IN THE MORNING 90 capsule 3   FLUoxetine (PROZAC) 40 MG capsule Take 40 mg by mouth daily.     Insulin Pen Needle (PEN NEEDLES) 31G X 6 MM MISC For use with victoza /saxenda  100 each 0   Iron , Ferrous Sulfate , 325 (65 Fe) MG TABS Take 325 mg by mouth daily. 90 tablet 1   Liraglutide  -Weight Management (SAXENDA ) 18 MG/3ML SOPN Inject 0.6 mg into the skin daily. Increase dose weekly as follows: Week 2: 1.2 mg daily ; Week 3: 1.8 mg daily; Week 4: 2.4 mg daily 9 mL 0   Multiple Vitamin (MULTIVITAMIN ADULT PO) Take by mouth.     ondansetron  (ZOFRAN -ODT) 4 MG disintegrating tablet TAKE 1 TABLET  BY MOUTH EVERY 8 HOURS AS NEEDED FOR NAUSEA AND VOMITING 18 tablet 1   tiZANidine  (ZANAFLEX ) 4 MG capsule TAKE 1 CAPSULE BY MOUTH 3 TIMES DAILY AS NEEDED FOR MUSCLE SPASMS. 270 capsule 0   traMADol  (ULTRAM ) 50 MG tablet Take 1 tablet (50 mg total) by mouth at bedtime as needed. 30 tablet 5   traZODone  (DESYREL ) 50 MG tablet TAKE 0.5-1 TABLETS (25-50 MG TOTAL) BY MOUTH AT BEDTIME AS NEEDED FOR SLEEP. (NEED APPT) 270 tablet 1   TURMERIC PO Take 1,000 mg by mouth.     VAGIFEM  10 MCG TABS vaginal tablet INSERT ONE TABLET VAGINALLY NIGHTLY FOR 2 WEEKS, THEN WEEKLY THEREAFTER 24 tablet 3   ALPRAZolam  (XANAX ) 0.5 MG tablet Take 1 tablet (0.5 mg total) by mouth at bedtime as needed. 20 tablet 0   No facility-administered medications prior to visit.    Allergies  Allergen Reactions   Erythromycin Nausea Only   Lisinopril  Swelling    Angio-edema   Penicillins    Sulfa Antibiotics Rash    ROS Review of Systems Negative unless indicated in HPI.    Objective:    Physical Exam Constitutional:      Appearance: Normal appearance.  HENT:     Mouth/Throat:     Mouth: Mucous membranes are moist.  Eyes:     Conjunctiva/sclera: Conjunctivae normal.     Pupils: Pupils are equal, round, and reactive to light.   Cardiovascular:     Rate and Rhythm: Normal rate and regular rhythm.     Pulses: Normal pulses.     Heart sounds: Normal heart sounds.  Pulmonary:     Effort: Pulmonary effort is normal.     Breath sounds: Normal breath sounds.  Musculoskeletal:     Cervical back: Normal range of motion. No tenderness.  Skin:    General: Skin is warm.     Findings: No bruising.  Neurological:     General: No focal deficit present.     Mental Status: She is alert and oriented to person, place, and time. Mental status is at baseline.  Psychiatric:        Mood and Affect: Mood normal.        Behavior: Behavior normal.        Thought Content: Thought content normal.        Judgment: Judgment normal.     BP (!) 154/74   Pulse (!) 113   Temp 97.9 F (36.6 C)   Ht 5' 1 (1.549 m)   Wt 122 lb 12.8 oz (55.7 kg)   LMP 01/15/2018 (Approximate)   SpO2 98%   BMI 23.20 kg/m  Wt Readings from Last 3 Encounters:  09/04/24 122 lb 12.8 oz (55.7 kg)  05/01/24 145 lb (65.8 kg)  04/09/24 144 lb (65.3 kg)     Health Maintenance  Topic Date Due   HIV Screening  Never done   Zoster Vaccines- Shingrix (1 of 2) Never done   Pneumococcal Vaccine: 50+ Years (1 of 1 - PCV) Never done   COVID-19 Vaccine (1) 09/19/2024 (Originally 12/19/1965)   Influenza Vaccine  03/03/2025 (Originally 07/04/2024)   Cervical Cancer Screening (HPV/Pap Cotest)  05/05/2025   Mammogram  09/05/2025   DTaP/Tdap/Td (3 - Td or Tdap) 03/22/2031   Colonoscopy  11/13/2033   Hepatitis C Screening  Completed   Hepatitis B Vaccines 19-59 Average Risk  Aged Out   HPV VACCINES  Aged Out   Meningococcal B Vaccine  Aged Out  There are no preventive care reminders to display for this patient.  Lab Results  Component Value Date   TSH 0.76 09/04/2024   Lab Results  Component Value Date   WBC 10.1 08/22/2024   HGB 13.8 08/22/2024   HCT 40.1 08/22/2024   MCV 91.2 08/22/2024   PLT 334.0 08/22/2024   Lab Results  Component Value  Date   NA 140 09/04/2024   K 3.6 09/04/2024   CO2 32 09/04/2024   GLUCOSE 87 09/04/2024   BUN 6 09/04/2024   CREATININE 0.79 09/04/2024   BILITOT 0.4 09/04/2024   ALKPHOS 53 09/04/2024   AST 18 09/04/2024   ALT 15 09/04/2024   PROT 7.1 09/04/2024   ALBUMIN 4.2 09/04/2024   CALCIUM 9.6 09/04/2024   GFR 80.57 09/04/2024   Lab Results  Component Value Date   CHOL 203 (H) 04/09/2024   Lab Results  Component Value Date   HDL 71.90 04/09/2024   Lab Results  Component Value Date   LDLCALC 121 (H) 04/09/2024   Lab Results  Component Value Date   TRIG 52.0 04/09/2024   Lab Results  Component Value Date   CHOLHDL 3 04/09/2024   Lab Results  Component Value Date   HGBA1C 5.5 09/04/2024      Assessment & Plan:  Other fatigue Assessment & Plan: Persistent fatigue likely due to iron  deficiency. Thyroid  and glucose levels to be reassessed. Previous B12 deficiency managed with injections. -Will check labs as outlined  Orders: -     TSH -     Hemoglobin A1c -     Comprehensive metabolic panel with GFR  Vitamin D  deficiency  Vitamin B 12 deficiency Assessment & Plan: Managed with monthly injections. Recent levels not checked. - Continue monthly B12 injections if levels are low.      Follow-up: No follow-ups on file.   Shedrick Sarli, NP

## 2024-09-04 NOTE — Patient Instructions (Signed)

## 2024-09-05 ENCOUNTER — Ambulatory Visit
Admission: RE | Admit: 2024-09-05 | Discharge: 2024-09-05 | Disposition: A | Discharge status: Home / Self Care | Source: Ambulatory Visit | Attending: Internal Medicine | Admitting: Internal Medicine

## 2024-09-05 ENCOUNTER — Encounter

## 2024-09-05 DIAGNOSIS — Z1231 Encounter for screening mammogram for malignant neoplasm of breast: Secondary | ICD-10-CM | POA: Diagnosis present

## 2024-09-11 ENCOUNTER — Other Ambulatory Visit: Payer: Self-pay | Admitting: Internal Medicine

## 2024-09-17 DIAGNOSIS — R5383 Other fatigue: Secondary | ICD-10-CM | POA: Insufficient documentation

## 2024-09-17 NOTE — Assessment & Plan Note (Signed)
 Persistent fatigue likely due to iron  deficiency. Thyroid  and glucose levels to be reassessed. Previous B12 deficiency managed with injections. -Will check labs as outlined

## 2024-09-18 ENCOUNTER — Ambulatory Visit: Payer: Self-pay | Admitting: Nurse Practitioner

## 2024-09-18 DIAGNOSIS — E559 Vitamin D deficiency, unspecified: Secondary | ICD-10-CM | POA: Insufficient documentation

## 2024-09-18 DIAGNOSIS — E538 Deficiency of other specified B group vitamins: Secondary | ICD-10-CM | POA: Insufficient documentation

## 2024-09-18 NOTE — Assessment & Plan Note (Signed)
 Managed with monthly injections. Recent levels not checked. - Continue monthly B12 injections if levels are low.

## 2024-09-28 ENCOUNTER — Encounter: Payer: Self-pay | Admitting: Emergency Medicine

## 2024-09-28 ENCOUNTER — Ambulatory Visit
Admission: EM | Admit: 2024-09-28 | Discharge: 2024-09-28 | Disposition: A | Discharge status: Home / Self Care | Attending: Emergency Medicine | Admitting: Emergency Medicine

## 2024-09-28 DIAGNOSIS — B349 Viral infection, unspecified: Secondary | ICD-10-CM | POA: Diagnosis not present

## 2024-09-28 DIAGNOSIS — J101 Influenza due to other identified influenza virus with other respiratory manifestations: Secondary | ICD-10-CM | POA: Diagnosis not present

## 2024-09-28 LAB — POC COVID19/FLU A&B COMBO
Covid Antigen, POC: NEGATIVE
Influenza A Antigen, POC: POSITIVE — AB
Influenza B Antigen, POC: NEGATIVE

## 2024-09-28 MED ORDER — PROMETHAZINE-DM 6.25-15 MG/5ML PO SYRP: 5.0000 mL | ORAL_SOLUTION | Freq: Four times a day (QID) | ORAL | 0 refills | Status: AC | PRN

## 2024-09-28 MED ORDER — OSELTAMIVIR PHOSPHATE 75 MG PO CAPS: 75.0000 mg | ORAL_CAPSULE | Freq: Two times a day (BID) | ORAL | 0 refills | Status: DC

## 2024-09-28 NOTE — ED Triage Notes (Signed)
 Patient reports headache nausea, chills, cough, fever and nasal congestion x 1 day.  Patient mucinex and dayquil/Nyquil with mild relief. Rates pain 2/10.

## 2024-09-28 NOTE — Discharge Instructions (Addendum)
 Influenza A is a virus and should steadily improve in time it can take up to 7 to 10 days before you truly start to see a turnaround however things will get better  Begin Tamiflu every morning and every evening for 5 days to reduce the amount of virus in the body which helps to minimize symptoms  May use cough syrup every 6 hours as needed, this medicine also will help with nausea    You can take Tylenol  and/or Ibuprofen as needed for fever reduction and pain relief.   For cough: honey 1/2 to 1 teaspoon (you can dilute the honey in water or another fluid).  You can also use guaifenesin and dextromethorphan for cough. You can use a humidifier for chest congestion and cough.  If you don't have a humidifier, you can sit in the bathroom with the hot shower running.      For sore throat: try warm salt water gargles, cepacol lozenges, throat spray, warm tea or water with lemon/honey, popsicles or ice, or OTC cold relief medicine for throat discomfort.   For congestion: take a daily anti-histamine like Zyrtec, Claritin, and a oral decongestant, such as pseudoephedrine.  You can also use Flonase  1-2 sprays in each nostril daily.   It is important to stay hydrated: drink plenty of fluids (water, gatorade/powerade/pedialyte, juices, or teas) to keep your throat moisturized and help further relieve irritation/discomfort.

## 2024-09-28 NOTE — ED Provider Notes (Signed)
 CAY RALPH PELT    CSN: [id number] Arrival date & time: 09/28/24  1118      History   Chief Complaint Chief Complaint  Patient presents with   Headache   Cough   Fever   Nasal Congestion   Chills    HPI Destiny Martin is a 63 y.o. female.   Patient presents for evaluation of fever peaking at 100, increased fatigue, chills and bodyaches, intermittent headaches, nasal congestion, productive cough, bilateral ear fullness and popping, nausea without vomiting beginning 1 day ago.  No known sick contact.  Tolerable to food and liquids but appetite is decreased.  Has attempted use of Mucinex, DayQuil and NyQuil.  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   Complicated grief 03/02/2017   GERD (gastroesophageal reflux disease)    Hypertension    Osteoarthritis of carpometacarpal (CMC) joint of thumb 02/20/2019   Pain in right hand 02/20/2019    Patient Active Problem List   Diagnosis Date Noted   Vitamin B 12 deficiency 09/18/2024   Other fatigue 09/17/2024   Polyarthralgia 04/11/2024   Hiatal hernia 04/09/2024   Herpes zoster without complication 03/01/2023   Primary osteoarthritis of both hands 10/09/2022   COVID-19 virus infection 01/06/2021   Atrophic vaginitis 05/06/2020   Migraine headache 12/26/2019   Capsulitis of metatarsophalangeal (MTP) joint of right foot 06/16/2018   Gastroesophageal reflux disease without esophagitis 03/12/2017   Encounter for screening colonoscopy 03/12/2017   Insomnia due to stress 07/18/2016   Carpal tunnel syndrome 01/12/2015   Generalized anxiety disorder 01/12/2015   Cervical spondylosis without myelopathy 03/17/2014   Obstructive sleep apnea 03/16/2014   Anemia, iron  deficiency 05/07/2013   Pernicious anemia 05/07/2013   Encounter for preventive health examination 05/02/2013   Qualyn Oyervides coat syndrome without diagnosis of hypertension  09/07/2011   Overweight (BMI 25.0-29.9) 09/07/2011   Contraception management 09/07/2011    Past Surgical History:  Procedure Laterality Date   BIOPSY  11/14/2023   Procedure: BIOPSY;  Surgeon: Therisa Bi, MD;  Location: [facility] ENDOSCOPY;  Service: Gastroenterology;;   COLONOSCOPY WITH PROPOFOL  N/A 03/27/2017   Procedure: COLONOSCOPY WITH PROPOFOL ;  Surgeon: Reyes LELON Cota, MD;  Location: [facility] ENDOSCOPY;  Service: Endoscopy;  Laterality: N/A;   COLONOSCOPY WITH PROPOFOL  N/A 11/14/2023   Procedure: COLONOSCOPY WITH PROPOFOL ;  Surgeon: Therisa Bi, MD;  Location: [facility] ENDOSCOPY;  Service: Gastroenterology;  Laterality: N/A;   ESOPHAGOGASTRODUODENOSCOPY (EGD) WITH PROPOFOL  N/A 03/27/2017   Procedure: ESOPHAGOGASTRODUODENOSCOPY (EGD) WITH PROPOFOL ;  Surgeon: Reyes LELON Cota, MD;  Location: [facility] ENDOSCOPY;  Service: Endoscopy;  Laterality: N/A;   ESOPHAGOGASTRODUODENOSCOPY (EGD) WITH PROPOFOL  N/A 03/17/2020   Procedure: ESOPHAGOGASTRODUODENOSCOPY (EGD) WITH PROPOFOL ;  Surgeon: Cota Reyes LELON, MD;  Location: [facility] ENDOSCOPY;  Service: Endoscopy;  Laterality: N/A;  also for excision anal tag in endo   ESOPHAGOGASTRODUODENOSCOPY (EGD) WITH PROPOFOL  N/A 11/14/2023   Procedure: ESOPHAGOGASTRODUODENOSCOPY (EGD) WITH PROPOFOL ;  Surgeon: Therisa Bi, MD;  Location: [facility] ENDOSCOPY;  Service: Gastroenterology;  Laterality: N/A;   ESOPHAGOGASTRODUODENOSCOPY ENDOSCOPY     2008    FOOT SURGERY Right 2007   JOINT REPLACEMENT  11-18-16   surgery on vertibra   NECK SURGERY  2016   TONSILLECTOMY  1974    OB History     Gravida  1   Para      Term      Preterm      AB  1   Living         SAB  1   IAB      Ectopic      Multiple      Live Births           Obstetric Comments  1st Menstrual Cycle:  14 1st Pregnancy:            Home Medications    Prior to Admission medications   Medication Sig Start Date End Date Taking? Authorizing Provider  oseltamivir (TAMIFLU) 75  MG capsule Take 1 capsule (75 mg total) by mouth every 12 (twelve) hours. 09/28/24  Yes Marielouise Amey R, NP  promethazine-dextromethorphan (PROMETHAZINE-DM) 6.25-15 MG/5ML syrup Take 5 mLs by mouth 4 (four) times daily as needed. 09/28/24  Yes Lumir Demetriou R, NP  ALPRAZolam  (XANAX ) 0.5 MG tablet TAKE 1 TABLET BY MOUTH AT BEDTIME AS NEEDED. 09/12/24   Marylynn Verneita CROME, MD  amphetamine-dextroamphetamine (ADDERALL XR) 30 MG 24 hr capsule Take 30 mg by mouth. 05/05/18   [provider]  amphetamine-dextroamphetamine (ADDERALL) 30 MG tablet Take 1 tablet by mouth 2 (two) times daily. 05/05/18   [provider]  buPROPion (WELLBUTRIN XL) 150 MG 24 hr tablet Take 150 mg by mouth daily. 03/31/24   [provider]  butalbital -acetaminophen -caffeine  (FIORICET) 50-325-40 MG tablet TAKE 1 TABLET BY MOUTH EVERY 6 HOURS AS NEEDED FOR HEADACHE. 10/24/23   Marylynn Verneita CROME, MD  Calcium-Vitamin D -Vitamin K (VIACTIV CALCIUM PLUS D) 650-12.5-40 MG-MCG-MCG CHEW Chew by mouth.    [provider]  celecoxib  (CELEBREX ) 200 MG capsule Take 200 mg by mouth 2 (two) times daily. 05/01/23   [provider]  Cholecalciferol (D3 2000 PO) Take 2,000 Units by mouth.    [provider]  conjugated estrogens  (PREMARIN ) vaginal cream Place 1 Applicatorful vaginally daily. 05/05/20   Marylynn Verneita CROME, MD  cyanocobalamin  (VITAMIN B12) 1000 MCG/ML injection Inject 1 mL (1,000 mcg total) into the muscle every 30 (thirty) days. 08/20/24   Marylynn Verneita CROME, MD  esomeprazole  (NEXIUM ) 40 MG capsule TAKE 1 CAPSULE (40 MG TOTAL) BY MOUTH IN THE MORNING 06/11/23   Marylynn Verneita CROME, MD  FLUoxetine (PROZAC) 40 MG capsule Take 40 mg by mouth daily. 06/20/19   [provider]  Insulin Pen Needle (PEN NEEDLES) 31G X 6 MM MISC For use with victoza /saxenda  04/16/24   Marylynn Verneita CROME, MD  Iron , Ferrous Sulfate , 325 (65 Fe) MG TABS Take 325 mg by mouth daily. 08/21/23   Marylynn Verneita CROME, MD  Liraglutide   -Weight Management (SAXENDA ) 18 MG/3ML SOPN Inject 0.6 mg into the skin daily. Increase dose weekly as follows: Week 2: 1.2 mg daily ; Week 3: 1.8 mg daily; Week 4: 2.4 mg daily 04/16/24   Tullo, Teresa L, MD  Multiple Vitamin (MULTIVITAMIN ADULT PO) Take by mouth.    [provider]  ondansetron  (ZOFRAN -ODT) 4 MG disintegrating tablet TAKE 1 TABLET BY MOUTH EVERY 8 HOURS AS NEEDED FOR NAUSEA AND VOMITING 10/10/21   Marylynn Verneita CROME, MD  tiZANidine  (ZANAFLEX ) 4 MG capsule TAKE 1 CAPSULE BY MOUTH 3 TIMES DAILY AS NEEDED FOR MUSCLE SPASMS. 03/24/24   Marylynn Verneita CROME, MD  traMADol  (ULTRAM ) 50 MG tablet Take 1 tablet (50 mg total) by mouth at bedtime as needed. 04/09/24   Marylynn Verneita CROME, MD  traZODone  (DESYREL ) 50 MG tablet TAKE 0.5-1 TABLETS (25-50 MG TOTAL) BY MOUTH AT BEDTIME AS NEEDED FOR SLEEP. (NEED APPT) 01/28/21   Marylynn,  Verneita CROME, MD  TURMERIC PO Take 1,000 mg by mouth.    [provider]  VAGIFEM  10 MCG TABS vaginal tablet INSERT ONE TABLET VAGINALLY NIGHTLY FOR 2 WEEKS, THEN WEEKLY THEREAFTER 06/22/22   Marylynn Verneita CROME, MD    Family History Family History  Problem Relation Age of Onset   Hypertension Father    Heart disease Father    Prostate cancer Father    Cancer Father    Hearing loss Father    Stroke Father    Heart disease Mother    Hypertension Mother    Arthritis Paternal Grandmother    Breast cancer Neg Hx     Social History Social History   Tobacco Use   Smoking status: Never   Smokeless tobacco: Never  Vaping Use   Vaping status: Never Used  Substance Use Topics   Alcohol use: Yes    Comment: I may have a drink but not often.   Drug use: No     Allergies   Erythromycin, Lisinopril , Penicillins, and Sulfa antibiotics   Review of Systems Review of Systems   Physical Exam Triage Vital Signs ED Triage Vitals  Encounter Vitals Group     BP 09/28/24 1204 123/85     Girls Systolic BP Percentile --      Girls Diastolic BP Percentile --       Boys Systolic BP Percentile --      Boys Diastolic BP Percentile --      Pulse Rate 09/28/24 1204 88     Resp 09/28/24 1204 20     Temp 09/28/24 1204 98.5 F (36.9 C)     Temp Source 09/28/24 1204 Oral     SpO2 09/28/24 1204 95 %     Weight --      Height --      Head Circumference --      Peak Flow --      Pain Score 09/28/24 1208 2     Pain Loc --      Pain Education --      Exclude from Growth Chart --    No data found.  Updated Vital Signs BP 123/85 (BP Location: Left Arm)   Pulse 88   Temp 98.5 F (36.9 C) (Oral)   Resp 20   LMP 01/15/2018 (Approximate)   SpO2 95%   Visual Acuity Right Eye Distance:   Left Eye Distance:   Bilateral Distance:    Right Eye Near:   Left Eye Near:    Bilateral Near:     Physical Exam Constitutional:      Appearance: Normal appearance.  HENT:     Head: Normocephalic.     Right Ear: Tympanic membrane, ear canal and external ear normal.     Left Ear: Tympanic membrane, ear canal and external ear normal.     Nose: Congestion present.     Mouth/Throat:     Mouth: Mucous membranes are moist.     Pharynx: Oropharynx is clear. No oropharyngeal exudate or posterior oropharyngeal erythema.  Eyes:     Extraocular Movements: Extraocular movements intact.  Cardiovascular:     Rate and Rhythm: Normal rate and regular rhythm.     Pulses: Normal pulses.     Heart sounds: Normal heart sounds.  Pulmonary:     Effort: Pulmonary effort is normal.     Breath sounds: Normal breath sounds.  Musculoskeletal:     Cervical back: Normal range of motion and neck supple.  Neurological:  Mental Status: She is alert and oriented to person, place, and time. Mental status is at baseline.      UC Treatments / Results  Labs (all labs ordered are listed, but only abnormal results are displayed) Labs Reviewed  POC COVID19/FLU A&B COMBO - Abnormal; Notable for the following components:      Result Value   Influenza A Antigen, POC Positive (*)     All other components within normal limits    EKG   Radiology No results found.  Procedures Procedures (including critical care time)  Medications Ordered in UC Medications - No data to display  Initial Impression / Assessment and Plan / UC Course  I have reviewed the triage vital signs and the nursing notes.  Pertinent labs & imaging results that were available during my care of the patient were reviewed by me and considered in my medical decision making (see chart for details).  Influenza A, viral illness  Patient is in no signs of distress nor toxic appearing.  Vital signs are stable.  Low suspicion for pneumonia, pneumothorax or bronchitis and therefore will defer imaging.  Prescribed Tamiflu.  Discussed quarantine if with fever.May use additional over-the-counter medications as needed for supportive care.  May follow-up with urgent care as needed if symptoms persist or worsen.  Final Clinical Impressions(s) / UC Diagnoses   Final diagnoses:  Viral illness  Influenza A     Discharge Instructions      Influenza A is a virus and should steadily improve in time it can take up to 7 to 10 days before you truly start to see a turnaround however things will get better  Begin Tamiflu every morning and every evening for 5 days to reduce the amount of virus in the body which helps to minimize symptoms  May use cough syrup every 6 hours as needed, this medicine also will help with nausea    You can take Tylenol  and/or Ibuprofen as needed for fever reduction and pain relief.   For cough: honey 1/2 to 1 teaspoon (you can dilute the honey in water or another fluid).  You can also use guaifenesin and dextromethorphan for cough. You can use a humidifier for chest congestion and cough.  If you don't have a humidifier, you can sit in the bathroom with the hot shower running.      For sore throat: try warm salt water gargles, cepacol lozenges, throat spray, warm tea or water with  lemon/honey, popsicles or ice, or OTC cold relief medicine for throat discomfort.   For congestion: take a daily anti-histamine like Zyrtec, Claritin, and a oral decongestant, such as pseudoephedrine.  You can also use Flonase  1-2 sprays in each nostril daily.   It is important to stay hydrated: drink plenty of fluids (water, gatorade/powerade/pedialyte, juices, or teas) to keep your throat moisturized and help further relieve irritation/discomfort.    ED Prescriptions     Medication Sig Dispense Auth. Provider   oseltamivir (TAMIFLU) 75 MG capsule Take 1 capsule (75 mg total) by mouth every 12 (twelve) hours. 10 capsule Timisha Mondry R, NP   promethazine-dextromethorphan (PROMETHAZINE-DM) 6.25-15 MG/5ML syrup Take 5 mLs by mouth 4 (four) times daily as needed. 118 mL Kenner Lewan, Shelba SAUNDERS, NP      PDMP not reviewed this encounter.   Teresa Shelba SAUNDERS, NP 09/28/24 1316

## 2024-10-03 ENCOUNTER — Encounter: Payer: Self-pay | Admitting: Oncology

## 2024-10-10 ENCOUNTER — Other Ambulatory Visit: Payer: Self-pay | Admitting: Internal Medicine

## 2024-10-10 ENCOUNTER — Encounter: Payer: Self-pay | Admitting: Internal Medicine

## 2024-10-10 ENCOUNTER — Ambulatory Visit: Admitting: Internal Medicine

## 2024-10-10 VITALS — BP 126/70 | HR 78 | Temp 97.5°F | Ht 60.0 in | Wt 118.6 lb

## 2024-10-10 DIAGNOSIS — D510 Vitamin B12 deficiency anemia due to intrinsic factor deficiency: Secondary | ICD-10-CM | POA: Diagnosis not present

## 2024-10-10 DIAGNOSIS — F5102 Adjustment insomnia: Secondary | ICD-10-CM

## 2024-10-10 DIAGNOSIS — F909 Attention-deficit hyperactivity disorder, unspecified type: Secondary | ICD-10-CM

## 2024-10-10 DIAGNOSIS — R030 Elevated blood-pressure reading, without diagnosis of hypertension: Secondary | ICD-10-CM | POA: Diagnosis not present

## 2024-10-10 DIAGNOSIS — E538 Deficiency of other specified B group vitamins: Secondary | ICD-10-CM

## 2024-10-10 DIAGNOSIS — D508 Other iron deficiency anemias: Secondary | ICD-10-CM

## 2024-10-10 DIAGNOSIS — F411 Generalized anxiety disorder: Secondary | ICD-10-CM

## 2024-10-10 DIAGNOSIS — K219 Gastro-esophageal reflux disease without esophagitis: Secondary | ICD-10-CM

## 2024-10-10 LAB — COMPREHENSIVE METABOLIC PANEL WITH GFR
ALT: 10 U/L (ref 0–35)
AST: 16 U/L (ref 0–37)
Albumin: 4.3 g/dL (ref 3.5–5.2)
Alkaline Phosphatase: 47 U/L (ref 39–117)
BUN: 9 mg/dL (ref 6–23)
CO2: 31 meq/L (ref 19–32)
Calcium: 9.4 mg/dL (ref 8.4–10.5)
Chloride: 102 meq/L (ref 96–112)
Creatinine, Ser: 0.74 mg/dL (ref 0.40–1.20)
GFR: 87.08 mL/min (ref >60.00)
Glucose, Bld: 77 mg/dL (ref 70–99)
Potassium: 3.8 meq/L (ref 3.5–5.1)
Sodium: 141 meq/L (ref 135–145)
Total Bilirubin: 0.4 mg/dL (ref 0.2–1.2)
Total Protein: 6.7 g/dL (ref 6.0–8.3)

## 2024-10-10 LAB — CBC WITH DIFFERENTIAL/PLATELET
Basophils Absolute: 0 K/uL (ref 0.0–0.1)
Basophils Relative: 0.8 % (ref 0.0–3.0)
Eosinophils Absolute: 0.2 K/uL (ref 0.0–0.7)
Eosinophils Relative: 3 % (ref 0.0–5.0)
HCT: 39 % (ref 36.0–46.0)
Hemoglobin: 13.1 g/dL (ref 12.0–15.0)
Lymphocytes Relative: 33.8 % (ref 12.0–46.0)
Lymphs Abs: 2.1 K/uL (ref 0.7–4.0)
MCHC: 33.6 g/dL (ref 30.0–36.0)
MCV: 91.6 fl (ref 78.0–100.0)
Monocytes Absolute: 0.5 K/uL (ref 0.1–1.0)
Monocytes Relative: 8.5 % (ref 3.0–12.0)
Neutro Abs: 3.3 K/uL (ref 1.4–7.7)
Neutrophils Relative %: 53.9 % (ref 43.0–77.0)
Platelets: 498 K/uL — ABNORMAL HIGH (ref 150.0–400.0)
RBC: 4.26 Mil/uL (ref 3.87–5.11)
RDW: 13.6 % (ref 11.5–15.5)
WBC: 6.2 K/uL (ref 4.0–10.5)

## 2024-10-10 LAB — IBC + FERRITIN
Ferritin: 213.1 ng/mL (ref 10.0–291.0)
Iron: 130 ug/dL (ref 42–145)
Saturation Ratios: 48.6 % (ref 20.0–50.0)
TIBC: 267.4 ug/dL (ref 250.0–450.0)
Transferrin: 191 mg/dL — ABNORMAL LOW (ref 212.0–360.0)

## 2024-10-10 MED ORDER — PROMETHAZINE HCL 25 MG RE SUPP: 25.0000 mg | Freq: Four times a day (QID) | RECTAL | 0 refills | Status: AC | PRN

## 2024-10-10 MED ORDER — CYANOCOBALAMIN 1000 MCG/ML IJ SOLN: 1000.0000 ug | INTRAMUSCULAR | 3 refills | Status: AC

## 2024-10-10 MED ORDER — TRAZODONE HCL 50 MG PO TABS: 100.0000 mg | ORAL_TABLET | Freq: Every evening | ORAL | 3 refills | Status: AC | PRN

## 2024-10-10 MED ORDER — BUTALBITAL-APAP-CAFFEINE 50-325-40 MG PO TABS: ORAL_TABLET | ORAL | 2 refills | Status: AC

## 2024-10-10 MED ORDER — PROMETHAZINE HCL 25 MG PO TABS: 25.0000 mg | ORAL_TABLET | Freq: Three times a day (TID) | ORAL | 0 refills | Status: AC | PRN

## 2024-10-10 MED ORDER — PEN NEEDLES 31G X 6 MM MISC: 0 refills | Status: DC

## 2024-10-10 MED ORDER — PROMETHAZINE HCL 25 MG PO TABS: 25.0000 mg | ORAL_TABLET | Freq: Three times a day (TID) | ORAL | 0 refills | Status: DC | PRN

## 2024-10-10 MED ORDER — ESOMEPRAZOLE MAGNESIUM 40 MG PO CPDR: 40.0000 mg | DELAYED_RELEASE_CAPSULE | Freq: Two times a day (BID) | ORAL | 2 refills | Status: AC

## 2024-10-10 NOTE — Patient Instructions (Addendum)
 CHECK THE INREDIENT OF TRIM Z (was it tirzepitide?)  Take the trazodone  at bedtime OR BEFORE.  NEVER AT 2 AM  ONLY ALPRAZOLAM  CAN BE TAKEN AT 2 AM  Increase nexium  dose to 40 mg twice daily (rx sent)   Phenergan refilled as suppositories AND tablets (for nausea)    WE NEED TO CHECK YOUR STOOLS FOR BLOOD

## 2024-10-10 NOTE — Assessment & Plan Note (Signed)
 Recurrent ,  resolves  with iv iron  .  EGD/colon normal Dec 2024

## 2024-10-10 NOTE — Progress Notes (Signed)
 Subjective:  Patient ID: Destiny Martin, female    DOB: 1961-09-04  Age: 63 y.o. MRN: [id number]  CC: The primary encounter diagnosis was Other iron  deficiency anemia. Diagnoses of White coat syndrome without diagnosis of hypertension, Vitamin B 12 deficiency, Pernicious anemia, Insomnia due to stress, Gastroesophageal reflux disease without esophagitis, Generalized anxiety disorder, and Attention deficit hyperactivity disorder (ADHD), unspecified ADHD type were also pertinent to this visit.   HPI Destiny Martin presents for  Chief Complaint  Patient presents with   Medical Management of Chronic Issues    IRON  is low   1) Recurrent IDA:    Destiny Martin has received 2 ron infusions since her labs in September noted recurrence of low iron  .  2) RECURRENT NAUSEA .  STILL TAKING NEXIUM  40 MG DAILY USING  2 OTC  CAPSULES daily   2)  recovering from mutliple respiratory  infections  including the flu (after a bus ride to [place] with mother)  4) OVERWEIGHT: NEVER STARTED SAXENDA  DUE TO COST . Starting taking TRIM x  (TIRZEPATIDE?). In May,  and has  lost 22 lbs,  has retired from patent examiner  but not exercising regularly due to persistent right knee pain   Outpatient Medications Prior to Visit  Medication Sig Dispense Refill   ALPRAZolam  (XANAX ) 0.5 MG tablet TAKE 1 TABLET BY MOUTH AT BEDTIME AS NEEDED. 20 tablet 0   amphetamine-dextroamphetamine (ADDERALL XR) 30 MG 24 hr capsule Take 30 mg by mouth.  0   amphetamine-dextroamphetamine (ADDERALL) 30 MG tablet Take 1 tablet by mouth 2 (two) times daily.  0   buPROPion (WELLBUTRIN XL) 150 MG 24 hr tablet Take 150 mg by mouth daily.     Calcium-Vitamin D -Vitamin K (VIACTIV CALCIUM PLUS D) 650-12.5-40 MG-MCG-MCG CHEW Chew by mouth.     celecoxib  (CELEBREX ) 200 MG capsule Take 200 mg by mouth 2 (two) times daily.     Cholecalciferol (D3 2000 PO) Take 2,000 Units by mouth.     conjugated estrogens  (PREMARIN ) vaginal cream Place 1 Applicatorful  vaginally daily. 42.5 g 12   FLUoxetine (PROZAC) 40 MG capsule Take 40 mg by mouth daily.     Iron , Ferrous Sulfate , 325 (65 Fe) MG TABS Take 325 mg by mouth daily. 90 tablet 1   Multiple Vitamin (MULTIVITAMIN ADULT PO) Take by mouth.     ondansetron  (ZOFRAN -ODT) 4 MG disintegrating tablet TAKE 1 TABLET BY MOUTH EVERY 8 HOURS AS NEEDED FOR NAUSEA AND VOMITING 18 tablet 1   promethazine-dextromethorphan (PROMETHAZINE-DM) 6.25-15 MG/5ML syrup Take 5 mLs by mouth 4 (four) times daily as needed. 118 mL 0   tiZANidine  (ZANAFLEX ) 4 MG capsule TAKE 1 CAPSULE BY MOUTH 3 TIMES DAILY AS NEEDED FOR MUSCLE SPASMS. 270 capsule 0   traMADol  (ULTRAM ) 50 MG tablet Take 1 tablet (50 mg total) by mouth at bedtime as needed. 30 tablet 5   TURMERIC PO Take 1,000 mg by mouth.     VAGIFEM  10 MCG TABS vaginal tablet INSERT ONE TABLET VAGINALLY NIGHTLY FOR 2 WEEKS, THEN WEEKLY THEREAFTER 24 tablet 3   butalbital -acetaminophen -caffeine  (FIORICET) 50-325-40 MG tablet TAKE 1 TABLET BY MOUTH EVERY 6 HOURS AS NEEDED FOR HEADACHE. 48 tablet 2   cyanocobalamin  (VITAMIN B12) 1000 MCG/ML injection Inject 1 mL (1,000 mcg total) into the muscle every 30 (thirty) days. 3 mL 3   Liraglutide  -Weight Management (SAXENDA ) 18 MG/3ML SOPN Inject 0.6 mg into the skin daily. Increase dose weekly as follows: Week 2: 1.2 mg daily ;  Week 3: 1.8 mg daily; Week 4: 2.4 mg daily 9 mL 0   traZODone  (DESYREL ) 50 MG tablet TAKE 0.5-1 TABLETS (25-50 MG TOTAL) BY MOUTH AT BEDTIME AS NEEDED FOR SLEEP. (NEED APPT) 270 tablet 1   esomeprazole  (NEXIUM ) 40 MG capsule TAKE 1 CAPSULE (40 MG TOTAL) BY MOUTH IN THE MORNING 90 capsule 3   Insulin Pen Needle (PEN NEEDLES) 31G X 6 MM MISC For use with victoza /saxenda  100 each 0   oseltamivir (TAMIFLU) 75 MG capsule Take 1 capsule (75 mg total) by mouth every 12 (twelve) hours. (Patient not taking: Reported on 10/10/2024) 10 capsule 0   No facility-administered medications prior to visit.    Review of  Systems;  Patient denies headache, fevers, malaise, unintentional weight loss, skin rash, eye pain, sinus congestion and sinus pain, sore throat, dysphagia,  hemoptysis , cough, dyspnea, wheezing, chest pain, palpitations, orthopnea, edema, abdominal pain, nausea, melena, diarrhea, constipation, flank pain, dysuria, hematuria, urinary  Frequency, nocturia, numbness, tingling, seizures,  Focal weakness, Loss of consciousness,  Tremor, insomnia, depression, anxiety, and suicidal ideation.      Objective:  BP 126/70   Pulse 78   Temp (!) 97.5 F (36.4 C) (Oral)   Ht 5' (1.524 m)   Wt 118 lb 9.6 oz (53.8 kg)   LMP 01/15/2018 (Approximate)   SpO2 99%   BMI 23.16 kg/m   BP Readings from Last 3 Encounters:  10/10/24 126/70  09/28/24 123/85  09/04/24 135/88    Wt Readings from Last 3 Encounters:  10/10/24 118 lb 9.6 oz (53.8 kg)  09/04/24 122 lb 12.8 oz (55.7 kg)  05/01/24 145 lb (65.8 kg)    Physical Exam Vitals reviewed.  Constitutional:      General: She is not in acute distress.    Appearance: Normal appearance. She is normal weight. She is not ill-appearing, toxic-appearing or diaphoretic.  HENT:     Head: Normocephalic.  Eyes:     General: No scleral icterus.       Right eye: No discharge.        Left eye: No discharge.     Conjunctiva/sclera: Conjunctivae normal.  Cardiovascular:     Rate and Rhythm: Normal rate and regular rhythm.     Heart sounds: Normal heart sounds.  Pulmonary:     Effort: Pulmonary effort is normal. No respiratory distress.     Breath sounds: Normal breath sounds.  Musculoskeletal:        General: Normal range of motion.  Skin:    General: Skin is warm and dry.  Neurological:     General: No focal deficit present.     Mental Status: She is alert and oriented to person, place, and time. Mental status is at baseline.  Psychiatric:        Mood and Affect: Mood normal.        Behavior: Behavior normal.        Thought Content: Thought content  normal.        Judgment: Judgment normal.     Lab Results  Component Value Date   HGBA1C 5.5 09/04/2024   HGBA1C 5.6 04/09/2024   HGBA1C 6.3 08/16/2023    Lab Results  Component Value Date   CREATININE 0.74 10/10/2024   CREATININE 0.79 09/04/2024   CREATININE 0.79 04/09/2024    Lab Results  Component Value Date   WBC 6.2 10/10/2024   HGB 13.1 10/10/2024   HCT 39.0 10/10/2024   PLT 498.0 (H) 10/10/2024  GLUCOSE 77 10/10/2024   CHOL 203 (H) 04/09/2024   TRIG 52.0 04/09/2024   HDL 71.90 04/09/2024   LDLDIRECT 111.0 04/09/2024   LDLCALC 121 (H) 04/09/2024   ALT 10 10/10/2024   AST 16 10/10/2024   NA 141 10/10/2024   K 3.8 10/10/2024   CL 102 10/10/2024   CREATININE 0.74 10/10/2024   BUN 9 10/10/2024   CO2 31 10/10/2024   TSH 0.76 09/04/2024   HGBA1C 5.5 09/04/2024   MICROALBUR 0.8 04/09/2024    No results found.  Assessment & Plan:  .Other iron  deficiency anemia Assessment & Plan: Recurrent ,  resolves  with iv iron  .  EGD/colon normal Dec 2024   Orders: -     IBC + Ferritin -     CBC with Differential/Platelet -     Comprehensive metabolic panel with GFR -     Fecal occult blood, imunochemical; Future  White coat syndrome without diagnosis of hypertension Assessment & Plan: No medications needed at this time based on home readings and syncopal event during prior trial of antihypertensive.    Vitamin B 12 deficiency Assessment & Plan: ra  Lab Results  Component Value Date   VITAMINB12 589 04/09/2024      Pernicious anemia Assessment & Plan: Managed with monthly IM injections   Lab Results  Component Value Date   WBC 6.2 10/10/2024   HGB 13.1 10/10/2024   HCT 39.0 10/10/2024   MCV 91.6 10/10/2024   PLT 498.0 (H) 10/10/2024   Lab Results  Component Value Date   VITAMINB12 589 04/09/2024      Insomnia due to stress Assessment & Plan: Hyperactive brain managed iwht infrequent use of alprazolam .  The risks and benefits of  benzodiazepine use were discussed with patient today including excessive sedation leading to respiratory depression,  impaired thinking/driving, and addiction.  Patient was advised to avoid concurrent use with alcohol, to use medication only as needed and not to share with others  .    Gastroesophageal reflux disease without esophagitis Assessment & Plan: Complicated by hiatal hernia ,  with recurrent IDA managed with infusions/  advised to increase PPI to bid as a trial.  If symptoms do not resolve, will need GI referral for endoscopy   Generalized anxiety disorder Assessment & Plan: Aggravated by chronic pain , ADD and  years of stress during her career as a [employer].  Now retired   Continue lexapro for now,  . Encouraged to find a suitable exercise program   Attention deficit hyperactivity disorder (ADHD), unspecified ADHD type Assessment & Plan: Managed with Adderall by dr Vincente.    Other orders -     Esomeprazole  Magnesium ; Take 1 capsule (40 mg total) by mouth 2 (two) times daily before a meal. .  Dispense: 180 capsule; Refill: 2 -     Pen Needles; For use with victoza /saxenda   Dispense: 100 each; Refill: 0 -     Butalbital -APAP-Caffeine ; Take 1 tablet by mouth every 6 hrs as needed for headache  Dispense: 48 tablet; Refill: 2 -     Cyanocobalamin ; Inject 1 mL (1,000 mcg total) into the muscle every 30 (thirty) days.  Dispense: 3 mL; Refill: 3 -     traZODone  HCl; Take 2 tablets (100 mg total) by mouth at bedtime as needed for sleep.  Dispense: 180 tablet; Refill: 3 -     Promethazine HCl; Place 1 suppository (25 mg total) rectally every 6 (six) hours as needed for nausea or vomiting.  Dispense: 12 each; Refill: 0 -     Promethazine HCl; Take 1 tablet (25 mg total) by mouth every 8 (eight) hours as needed for nausea or vomiting.  Dispense: 20 tablet; Refill: 0    I personally spent a total of 40 minutes in the care of the patient today including getting/reviewing separately  obtained history, performing a medically appropriate exam/evaluation, counseling and educating, placing orders, documenting clinical information in the EHR, independently interpreting results, and communicating results. .   Follow-up: Return in about 6 months (around 04/09/2025).   Verneita LITTIE Kettering, MD

## 2024-10-12 ENCOUNTER — Ambulatory Visit: Payer: Self-pay | Admitting: Internal Medicine

## 2024-10-12 ENCOUNTER — Encounter: Payer: Self-pay | Admitting: Internal Medicine

## 2024-10-12 DIAGNOSIS — F988 Other specified behavioral and emotional disorders with onset usually occurring in childhood and adolescence: Secondary | ICD-10-CM | POA: Insufficient documentation

## 2024-10-12 NOTE — Assessment & Plan Note (Signed)
 Managed with Adderall by dr Vincente.

## 2024-10-12 NOTE — Assessment & Plan Note (Signed)
 ra  Lab Results  Component Value Date   [id number] 589 04/09/2024

## 2024-10-12 NOTE — Assessment & Plan Note (Signed)
 No medications needed at this time based on home readings and syncopal event during prior trial of antihypertensive.

## 2024-10-12 NOTE — Assessment & Plan Note (Signed)
 Hyperactive brain managed iwht infrequent use of alprazolam .  The risks and benefits of benzodiazepine use were discussed with patient today including excessive sedation leading to respiratory depression,  impaired thinking/driving, and addiction.  Patient was advised to avoid concurrent use with alcohol, to use medication only as needed and not to share with others  .

## 2024-10-12 NOTE — Assessment & Plan Note (Signed)
 Complicated by hiatal hernia ,  with recurrent IDA managed with infusions/  advised to increase PPI to bid as a trial.  If symptoms do not resolve, will need GI referral for endoscopy

## 2024-10-12 NOTE — Assessment & Plan Note (Signed)
 Aggravated by chronic pain , ADD and  years of stress during her career as a [employer].  Now retired   Continue lexapro for now,  . Encouraged to find a suitable exercise program

## 2024-10-12 NOTE — Assessment & Plan Note (Signed)
 Managed with monthly IM injections   Lab Results  Component Value Date   WBC 6.2 10/10/2024   HGB 13.1 10/10/2024   HCT 39.0 10/10/2024   MCV 91.6 10/10/2024   PLT 498.0 (H) 10/10/2024   Lab Results  Component Value Date   VITAMINB12 589 04/09/2024

## 2024-10-27 ENCOUNTER — Other Ambulatory Visit

## 2024-10-28 ENCOUNTER — Ambulatory Visit

## 2024-10-28 ENCOUNTER — Ambulatory Visit: Admitting: Oncology

## 2024-11-03 ENCOUNTER — Other Ambulatory Visit: Payer: Self-pay | Admitting: *Deleted

## 2024-11-03 DIAGNOSIS — D509 Iron deficiency anemia, unspecified: Secondary | ICD-10-CM

## 2024-11-04 ENCOUNTER — Ambulatory Visit: Admitting: Oncology

## 2024-11-04 ENCOUNTER — Ambulatory Visit

## 2024-11-04 ENCOUNTER — Inpatient Hospital Stay: Attending: Oncology

## 2024-11-04 DIAGNOSIS — D509 Iron deficiency anemia, unspecified: Secondary | ICD-10-CM | POA: Insufficient documentation

## 2024-11-04 DIAGNOSIS — I10 Essential (primary) hypertension: Secondary | ICD-10-CM | POA: Insufficient documentation

## 2024-11-04 DIAGNOSIS — Z8042 Family history of malignant neoplasm of prostate: Secondary | ICD-10-CM | POA: Insufficient documentation

## 2024-11-04 LAB — CBC WITH DIFFERENTIAL/PLATELET
Abs Immature Granulocytes: 0.01 K/uL (ref 0.00–0.07)
Basophils Absolute: 0 K/uL (ref 0.0–0.1)
Basophils Relative: 1 %
Eosinophils Absolute: 0.1 K/uL (ref 0.0–0.5)
Eosinophils Relative: 3 %
HCT: 37.6 % (ref 36.0–46.0)
Hemoglobin: 12.6 g/dL (ref 12.0–15.0)
Immature Granulocytes: 0 %
Lymphocytes Relative: 33 %
Lymphs Abs: 1.7 K/uL (ref 0.7–4.0)
MCH: 31.3 pg (ref 26.0–34.0)
MCHC: 33.5 g/dL (ref 30.0–36.0)
MCV: 93.5 fL (ref 80.0–100.0)
Monocytes Absolute: 0.4 K/uL (ref 0.1–1.0)
Monocytes Relative: 8 %
Neutro Abs: 2.8 K/uL (ref 1.7–7.7)
Neutrophils Relative %: 55 %
Platelets: 297 K/uL (ref 150–400)
RBC: 4.02 MIL/uL (ref 3.87–5.11)
RDW: 13.2 % (ref 11.5–15.5)
WBC: 5 K/uL (ref 4.0–10.5)
nRBC: 0 % (ref 0.0–0.2)

## 2024-11-04 LAB — IRON AND TIBC
Iron: 98 ug/dL (ref 28–170)
Saturation Ratios: 32 % — ABNORMAL HIGH (ref 10.4–31.8)
TIBC: 305 ug/dL (ref 250–450)
UIBC: 207 ug/dL

## 2024-11-04 LAB — FERRITIN: Ferritin: 209 ng/mL (ref 11–307)

## 2024-11-05 ENCOUNTER — Inpatient Hospital Stay

## 2024-11-05 ENCOUNTER — Inpatient Hospital Stay: Admitting: Oncology

## 2024-11-05 ENCOUNTER — Encounter: Payer: Self-pay | Admitting: Oncology

## 2024-11-05 VITALS — BP 140/89 | HR 81 | Temp 97.8°F | Resp 18 | Ht 60.0 in | Wt 127.9 lb

## 2024-11-05 DIAGNOSIS — D509 Iron deficiency anemia, unspecified: Secondary | ICD-10-CM

## 2024-11-05 NOTE — Progress Notes (Unsigned)
 [facility]  Telephone:(336) [phone] Fax:(336) [phone]  ID: Destiny Martin OB: 13-Sep-1961  MR#: [id number]  RDW#:[id number]  Patient Care Team: Marylynn Verneita CROME, MD as PCP - General (Internal Medicine) Marylynn Verneita CROME, MD (Internal Medicine) Jacobo Evalene PARAS, MD as Consulting Physician (Oncology)  CHIEF COMPLAINT: Iron  deficiency anemia.  INTERVAL HISTORY: Patient returns to clinic today for repeat laboratory, further evaluation, consideration of additional IV Venofer .  She has tendinitis in her left wrist, but otherwise feels well.  She does not complain of any weakness or fatigue. She has no neurologic complaints.  She denies any recent fevers or illnesses.  She has a good appetite and denies weight loss.  She has no chest pain, shortness of breath, cough, or hemoptysis.  She has no nausea, vomiting, constipation, or diarrhea.  She has no melena or hematochezia.  She has no urinary complaints.  Patient offers no further specific complaints today.  REVIEW OF SYSTEMS:   Review of Systems  Constitutional: Negative.  Negative for fever, malaise/fatigue and weight loss.  Respiratory: Negative.  Negative for cough, hemoptysis and shortness of breath.   Cardiovascular:  Negative for chest pain and leg swelling.  Gastrointestinal: Negative.  Negative for abdominal pain, blood in stool and melena.  Genitourinary: Negative.  Negative for hematuria.  Musculoskeletal:  Positive for joint pain. Negative for back pain.  Skin: Negative.   Neurological: Negative.  Negative for dizziness, focal weakness, weakness and headaches.  Psychiatric/Behavioral: Negative.  The patient is not nervous/anxious.     As per HPI. Otherwise, a complete review of systems is negative.  PAST MEDICAL HISTORY: Past Medical History:  Diagnosis Date  . ADD (attention deficit disorder)   . Allergy   . Anemia 2014  . 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 COVID-19 virus  infection 01/06/2021  . GERD (gastroesophageal reflux disease)   . Hypertension   . Osteoarthritis of carpometacarpal (CMC) joint of thumb 02/20/2019  . Overweight (BMI 25.0-29.9) 09/07/2011   Body mass index is 27.69 kg/m.       SABRA Pain in right hand 02/20/2019    PAST SURGICAL HISTORY: Past Surgical History:  Procedure Laterality Date  . BIOPSY  11/14/2023   Procedure: BIOPSY;  Surgeon: Therisa Bi, MD;  Location: [facility] ENDOSCOPY;  Service: Gastroenterology;;  . COLONOSCOPY WITH PROPOFOL  N/A 03/27/2017   Procedure: COLONOSCOPY WITH PROPOFOL ;  Surgeon: Reyes LELON Cota, MD;  Location: [facility] ENDOSCOPY;  Service: Endoscopy;  Laterality: N/A;  . COLONOSCOPY WITH PROPOFOL  N/A 11/14/2023   Procedure: COLONOSCOPY WITH PROPOFOL ;  Surgeon: Therisa Bi, MD;  Location: [facility] ENDOSCOPY;  Service: Gastroenterology;  Laterality: N/A;  . ESOPHAGOGASTRODUODENOSCOPY (EGD) WITH PROPOFOL  N/A 03/27/2017   Procedure: ESOPHAGOGASTRODUODENOSCOPY (EGD) WITH PROPOFOL ;  Surgeon: Reyes LELON Cota, MD;  Location: [facility] ENDOSCOPY;  Service: Endoscopy;  Laterality: N/A;  . ESOPHAGOGASTRODUODENOSCOPY (EGD) WITH PROPOFOL  N/A 03/17/2020   Procedure: ESOPHAGOGASTRODUODENOSCOPY (EGD) WITH PROPOFOL ;  Surgeon: Cota Reyes LELON, MD;  Location: [facility] ENDOSCOPY;  Service: Endoscopy;  Laterality: N/A;  also for excision anal tag in endo  . ESOPHAGOGASTRODUODENOSCOPY (EGD) WITH PROPOFOL  N/A 11/14/2023   Procedure: ESOPHAGOGASTRODUODENOSCOPY (EGD) WITH PROPOFOL ;  Surgeon: Therisa Bi, MD;  Location: [facility] ENDOSCOPY;  Service: Gastroenterology;  Laterality: N/A;  . ESOPHAGOGASTRODUODENOSCOPY ENDOSCOPY     2008   . FOOT SURGERY Right 2007  . JOINT REPLACEMENT  11-18-16   surgery on vertibra  . NECK SURGERY  2016  . TONSILLECTOMY  1974    FAMILY HISTORY: Family History  Problem Relation Age of Onset  . Hypertension Father   . Heart disease Father   . Prostate cancer Father   . Cancer Father   . Hearing loss Father   .  Stroke Father   . Heart disease Mother   . Hypertension Mother   . Arthritis Paternal Grandmother   . Breast cancer Neg Hx     ADVANCED DIRECTIVES (Y/N):  N  HEALTH MAINTENANCE: Social History   Tobacco Use  . Smoking status: Never  . Smokeless tobacco: Never  Vaping Use  . Vaping status: Never Used  Substance Use Topics  . Alcohol use: Yes    Comment: I may have a drink but not often.  . Drug use: No     Colonoscopy:  PAP:  Bone density:  Lipid panel:  Allergies  Allergen Reactions  . Erythromycin Nausea Only  . Lisinopril  Swelling    Angio-edema  . Penicillins   . Sulfa Antibiotics Rash    Current Outpatient Medications  Medication Sig Dispense Refill  . ALPRAZolam  (XANAX ) 0.5 MG tablet TAKE 1 TABLET BY MOUTH AT BEDTIME AS NEEDED. 20 tablet 0  . amphetamine-dextroamphetamine (ADDERALL XR) 30 MG 24 hr capsule Take 30 mg by mouth.  0  . amphetamine-dextroamphetamine (ADDERALL) 30 MG tablet Take 1 tablet by mouth 2 (two) times daily.  0  . buPROPion (WELLBUTRIN XL) 150 MG 24 hr tablet Take 150 mg by mouth daily.    . butalbital -acetaminophen -caffeine  (FIORICET) 50-325-40 MG tablet Take 1 tablet by mouth every 6 hrs as needed for headache 48 tablet 2  . Calcium-Vitamin D -Vitamin K (VIACTIV CALCIUM PLUS D) 650-12.5-40 MG-MCG-MCG CHEW Chew by mouth.    . celecoxib  (CELEBREX ) 200 MG capsule Take 200 mg by mouth 2 (two) times daily.    . Cholecalciferol (D3 2000 PO) Take 2,000 Units by mouth.    . conjugated estrogens  (PREMARIN ) vaginal cream Place 1 Applicatorful vaginally daily. 42.5 g 12  . cyanocobalamin  (VITAMIN B12) 1000 MCG/ML injection Inject 1 mL (1,000 mcg total) into the muscle every 30 (thirty) days. 3 mL 3  . EMBECTA PEN NEEDLE ULTRAFINE 32G X 6 MM MISC FOR USE WITH VICTOZA /SAXENDA  100 each 0  . esomeprazole  (NEXIUM ) 40 MG capsule Take 1 capsule (40 mg total) by mouth 2 (two) times daily before a meal. . 180 capsule 2  . FLUoxetine (PROZAC) 40 MG capsule  Take 40 mg by mouth daily.    . Iron , Ferrous Sulfate , 325 (65 Fe) MG TABS Take 325 mg by mouth daily. 90 tablet 1  . Multiple Vitamin (MULTIVITAMIN ADULT PO) Take by mouth.    . ondansetron  (ZOFRAN -ODT) 4 MG disintegrating tablet TAKE 1 TABLET BY MOUTH EVERY 8 HOURS AS NEEDED FOR NAUSEA AND VOMITING 18 tablet 1  . promethazine  (PHENERGAN ) 25 MG suppository Place 1 suppository (25 mg total) rectally every 6 (six) hours as needed for nausea or vomiting. 12 each 0  . promethazine  (PHENERGAN ) 25 MG tablet Take 1 tablet (25 mg total) by mouth every 8 (eight) hours as needed for nausea or vomiting. 20 tablet 0  . promethazine -dextromethorphan (PROMETHAZINE -DM) 6.25-15 MG/5ML syrup Take 5 mLs by mouth 4 (four) times daily as needed. 118 mL 0  . tiZANidine  (ZANAFLEX ) 4 MG capsule TAKE 1 CAPSULE BY MOUTH 3 TIMES DAILY AS NEEDED FOR MUSCLE SPASMS. 270 capsule 0  . traMADol  (ULTRAM ) 50 MG tablet Take 1 tablet (50 mg total) by mouth at bedtime as needed. 30 tablet 5  . traZODone  (DESYREL ) 50  MG tablet Take 2 tablets (100 mg total) by mouth at bedtime as needed for sleep. 180 tablet 3  . TURMERIC PO Take 1,000 mg by mouth.    . VAGIFEM  10 MCG TABS vaginal tablet INSERT ONE TABLET VAGINALLY NIGHTLY FOR 2 WEEKS, THEN WEEKLY THEREAFTER 24 tablet 3   No current facility-administered medications for this visit.    OBJECTIVE: There were no vitals filed for this visit.    There is no height or weight on file to calculate BMI.    ECOG FS:0 - Asymptomatic  General: Well-developed, well-nourished, no acute distress. Eyes: Pink conjunctiva, anicteric sclera. HEENT: Normocephalic, moist mucous membranes. Lungs: No audible wheezing or coughing. Heart: Regular rate and rhythm. Abdomen: Soft, nontender, no obvious distention. Musculoskeletal: No edema, cyanosis, or clubbing. Neuro: Alert, answering all questions appropriately. Cranial nerves grossly intact. Skin: No rashes or petechiae noted. Psych: Normal  affect.  LAB RESULTS:  Lab Results  Component Value Date   NA 141 10/10/2024   K 3.8 10/10/2024   CL 102 10/10/2024   CO2 31 10/10/2024   GLUCOSE 77 10/10/2024   BUN 9 10/10/2024   CREATININE 0.74 10/10/2024   CALCIUM 9.4 10/10/2024   PROT 6.7 10/10/2024   ALBUMIN 4.3 10/10/2024   AST 16 10/10/2024   ALT 10 10/10/2024   ALKPHOS 47 10/10/2024   BILITOT 0.4 10/10/2024    Lab Results  Component Value Date   WBC 5.0 11/04/2024   NEUTROABS 2.8 11/04/2024   HGB 12.6 11/04/2024   HCT 37.6 11/04/2024   MCV 93.5 11/04/2024   PLT 297 11/04/2024   Lab Results  Component Value Date   IRON  98 11/04/2024   TIBC 305 11/04/2024   IRONPCTSAT 32 (H) 11/04/2024   Lab Results  Component Value Date   FERRITIN 209 11/04/2024     STUDIES: No results found.  ASSESSMENT: Iron  deficiency anemia.  PLAN:    Iron  deficiency anemia: Patient's hemoglobin and iron  stores continue to be within normal limits other than a mildly decreased iron  saturation.  She recently underwent colonoscopy and EGD on November 14, 2023 that revealed mild gastritis, but no other significant pathology.  She does not require additional IV Venofer  today.  She last received treatment on September 21, 2023.  Return to clinic in 6 months with repeat laboratory work, further evaluation, and consideration of additional treatment if needed.  If patient's laboratory work remains within normal limits at that time, she likely can be discharged from clinic.   Thrombocytosis: Resolved. Hypertension: Chronic and unchanged.  Continue monitoring and treatment per primary care.    Patient expressed understanding and was in agreement with this plan. She also understands that She can call clinic at any time with any questions, concerns, or complaints.    Evalene JINNY Reusing, MD   11/05/2024 2:48 PM

## 2024-11-06 ENCOUNTER — Encounter: Payer: Self-pay | Admitting: Oncology

## 2024-11-11 ENCOUNTER — Ambulatory Visit (INDEPENDENT_AMBULATORY_CARE_PROVIDER_SITE_OTHER)

## 2024-11-11 DIAGNOSIS — D508 Other iron deficiency anemias: Secondary | ICD-10-CM

## 2024-11-11 LAB — FECAL OCCULT BLOOD, IMMUNOCHEMICAL: Fecal Occult Bld: NEGATIVE

## 2024-12-24 ENCOUNTER — Other Ambulatory Visit: Payer: Self-pay | Admitting: Internal Medicine

## 2024-12-24 ENCOUNTER — Encounter: Payer: Self-pay | Admitting: Internal Medicine

## 2024-12-24 ENCOUNTER — Telehealth: Payer: Self-pay | Admitting: Internal Medicine

## 2024-12-24 MED ORDER — ALPRAZOLAM 0.5 MG PO TABS: 0.5000 mg | ORAL_TABLET | Freq: Every evening | ORAL | 5 refills | Status: AC | PRN

## 2024-12-24 MED ORDER — TRAMADOL HCL 50 MG PO TABS: 50.0000 mg | ORAL_TABLET | Freq: Every evening | ORAL | 5 refills | Status: AC | PRN

## 2024-12-24 NOTE — Telephone Encounter (Signed)
 noted

## 2024-12-24 NOTE — Telephone Encounter (Signed)
 Controlled substance agreement has been signed for the Tramadol  and the Alprazolam .

## 2024-12-24 NOTE — Telephone Encounter (Signed)
 Non- opioid substance agreement has been scanned into the chart under release of information.

## 2024-12-24 NOTE — Telephone Encounter (Signed)
 Requesting: Tramadol  Contract: Yes UDS: no Last Visit: 10/10/2024 Next Visit: 04/09/2025 Last Refill: 04/09/2024  Please Advise     Requesting: Alprazolam  Contract: Yes UDS:  no Last Visit: 10/10/2024 Next Visit: 04/09/2025 Last Refill: 09/12/2024  Please Advise

## 2024-12-30 ENCOUNTER — Telehealth: Payer: Self-pay | Admitting: Pharmacy Technician

## 2024-12-30 ENCOUNTER — Other Ambulatory Visit (HOSPITAL_COMMUNITY): Payer: Self-pay

## 2024-12-30 NOTE — Telephone Encounter (Unsigned)
 Copied from CRM #[id number]. Topic: Clinical - Medication Prior Auth >> Dec 30, 2024  3:40 PM Tiffini S wrote: Reason for CRM: Patient called to request a prior auth for traMADol  (ULTRAM ) 50 MG tablet

## 2024-12-30 NOTE — Telephone Encounter (Signed)
 Pharmacy Patient Advocate Encounter   Received notification from [facility] that prior authorization for traMADol  HCl 50MG  tablets is required/requested.   Insurance verification completed.   The patient is insured through [facility].   Per test claim: PA required; PA submitted to above mentioned insurance via Latent Key/confirmation #/EOC [id number] Status is pending

## 2024-12-31 ENCOUNTER — Other Ambulatory Visit (HOSPITAL_COMMUNITY): Payer: Self-pay

## 2024-12-31 NOTE — Telephone Encounter (Signed)
 Spoke to pt and she is aware prior authorization went through also has a pending medication which is possibly the Tramadol  and will pick up tomorrow

## 2024-12-31 NOTE — Telephone Encounter (Signed)
 Pharmacy Patient Advocate Encounter  Received notification from [facility] that Prior Authorization for traMADol  HCl 50MG  tablets has been APPROVED from 12/30/2024 to 06/29/2025. Unable to obtain price due to refill too soon rejection, last fill date 12/30/2024 next available fill date02/20/2026.   PA #/Case ID/Reference #: [id number]

## 2025-01-07 ENCOUNTER — Other Ambulatory Visit (HOSPITAL_COMMUNITY): Payer: Self-pay

## 2025-01-07 ENCOUNTER — Telehealth: Payer: Self-pay

## 2025-01-07 NOTE — Telephone Encounter (Signed)
 Pharmacy Patient Advocate Encounter   Received notification from [facility] that prior authorization for Tramadol  50 mg tablets is required/requested.   Insurance verification completed.   The patient is insured through [facility] ADVANTAGE/[employer] ADV[employer].   Per test claim: Refill too soon. PA is not needed at this time. Medication was filled 12/30/2024. Next eligible fill date is 01/23/2025.

## 2025-03-06 ENCOUNTER — Inpatient Hospital Stay

## 2025-03-09 ENCOUNTER — Inpatient Hospital Stay

## 2025-03-09 ENCOUNTER — Inpatient Hospital Stay: Admitting: Oncology

## 2025-03-19 ENCOUNTER — Inpatient Hospital Stay

## 2025-03-20 ENCOUNTER — Inpatient Hospital Stay

## 2025-03-20 ENCOUNTER — Inpatient Hospital Stay: Admitting: Oncology

## 2025-04-09 ENCOUNTER — Ambulatory Visit: Admitting: Internal Medicine
